# Patient Record
Sex: Male | Born: 1965 | Race: Black or African American | Hispanic: No | Marital: Single | Smoking: Never smoker
Health system: Southern US, Community
[De-identification: ages and names within clinical notes are randomized; demographics above are authoritative.]

## PROBLEM LIST (undated history)

## (undated) DIAGNOSIS — I2109 ST elevation (STEMI) myocardial infarction involving other coronary artery of anterior wall: Secondary | ICD-10-CM

## (undated) DIAGNOSIS — I2699 Other pulmonary embolism without acute cor pulmonale: Secondary | ICD-10-CM

## (undated) DIAGNOSIS — Z8249 Family history of ischemic heart disease and other diseases of the circulatory system: Secondary | ICD-10-CM

## (undated) DIAGNOSIS — I236 Thrombosis of atrium, auricular appendage, and ventricle as current complications following acute myocardial infarction: Secondary | ICD-10-CM

## (undated) DIAGNOSIS — I5042 Chronic combined systolic (congestive) and diastolic (congestive) heart failure: Secondary | ICD-10-CM

## (undated) DIAGNOSIS — I493 Ventricular premature depolarization: Secondary | ICD-10-CM

## (undated) DIAGNOSIS — E785 Hyperlipidemia, unspecified: Secondary | ICD-10-CM

## (undated) DIAGNOSIS — I491 Atrial premature depolarization: Secondary | ICD-10-CM

## (undated) DIAGNOSIS — I255 Ischemic cardiomyopathy: Secondary | ICD-10-CM

## (undated) DIAGNOSIS — I472 Ventricular tachycardia: Secondary | ICD-10-CM

## (undated) DIAGNOSIS — R945 Abnormal results of liver function studies: Secondary | ICD-10-CM

## (undated) DIAGNOSIS — I4729 Other ventricular tachycardia: Secondary | ICD-10-CM

## (undated) DIAGNOSIS — I2510 Atherosclerotic heart disease of native coronary artery without angina pectoris: Secondary | ICD-10-CM

## (undated) DIAGNOSIS — I471 Supraventricular tachycardia, unspecified: Secondary | ICD-10-CM

## (undated) DIAGNOSIS — R7989 Other specified abnormal findings of blood chemistry: Secondary | ICD-10-CM

## (undated) DIAGNOSIS — I10 Essential (primary) hypertension: Secondary | ICD-10-CM

## (undated) HISTORY — DX: Ventricular premature depolarization: I49.3

## (undated) HISTORY — PX: LEG SURGERY: SHX1003

## (undated) HISTORY — DX: Other ventricular tachycardia: I47.29

## (undated) HISTORY — DX: Ventricular tachycardia: I47.2

## (undated) HISTORY — DX: Supraventricular tachycardia, unspecified: I47.10

## (undated) HISTORY — PX: WRIST SURGERY: SHX841

## (undated) HISTORY — DX: Atrial premature depolarization: I49.1

## (undated) HISTORY — DX: Chronic combined systolic (congestive) and diastolic (congestive) heart failure: I50.42

## (undated) HISTORY — DX: Supraventricular tachycardia: I47.1

---

## 2002-12-07 ENCOUNTER — Emergency Department (HOSPITAL_COMMUNITY): Admission: EM | Admit: 2002-12-07 | Discharge: 2002-12-07 | Payer: Self-pay | Admitting: Emergency Medicine

## 2002-12-07 ENCOUNTER — Encounter: Payer: Self-pay | Admitting: *Deleted

## 2003-01-04 ENCOUNTER — Ambulatory Visit (HOSPITAL_BASED_OUTPATIENT_CLINIC_OR_DEPARTMENT_OTHER): Admission: RE | Admit: 2003-01-04 | Discharge: 2003-01-04 | Payer: Self-pay | Admitting: Surgery

## 2003-01-04 ENCOUNTER — Encounter (INDEPENDENT_AMBULATORY_CARE_PROVIDER_SITE_OTHER): Payer: Self-pay | Admitting: *Deleted

## 2010-05-25 ENCOUNTER — Emergency Department (HOSPITAL_COMMUNITY): Admission: EM | Admit: 2010-05-25 | Discharge: 2010-05-25 | Payer: Self-pay | Admitting: Emergency Medicine

## 2011-04-10 NOTE — Op Note (Signed)
NAME:  COURTENAY, CREGER                          ACCOUNT NO.:  [id number]   MEDICAL RECORD NO.:  [id number]                   PATIENT TYPE:  AMB   LOCATION:  DSC                                  FACILITY:  [facility]   PHYSICIAN:  Abigail Miyamoto, M.D.              DATE OF BIRTH:  June 15, 1966   DATE OF PROCEDURE:  01/04/2003  DATE OF DISCHARGE:                                 OPERATIVE REPORT   PREOPERATIVE DIAGNOSES:  Multiple subcutaneous nodules right forearm and  left lateral thigh.   POSTOPERATIVE DIAGNOSES:  Multiple subcutaneous nodules right forearm and  left lateral thigh.   PROCEDURE:  Excision of multiple skin nodules right forearm and left lateral  thigh.   SURGEON:  Abigail Miyamoto, M.D.   ANESTHESIA:  1% Lidocaine and monitored anesthesia care.   ESTIMATED BLOOD LOSS:  Minimal.   DESCRIPTION OF PROCEDURE:  The patient was brought to the operating room,  identified as Rodney Marquez. He was placed supine on the operating room table  and anesthesia was induced. The patient was then turned slightly on his  right side. His left thigh was then prepped and draped in the usual sterile  fashion. The skin overlying the four masses on the lateral aspect of the  thigh was anesthetized with 1% lidocaine. Again the patient was found to  have four subcutaneous masses that were all lateral. Incisions were made  over each mass with a #15 blade and the incision was carried down into the  subcutaneous tissue and each mass was excised in its entirety. 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 The most proximal of the thigh was  approximately 4 cm, the most distal was 2 cm with the two in the middle  being approximately 1 cm. All wounds were then closed with a single 2-0  Vicryl suture and the was closed with Steri-Strips. Gauze and tape were then  applied.   The patient was placed back in the supine position. His right forearm was  then prepped and draped in the usual sterile  fashion. The patient had two  masses on the right forearm, the skin over the top of each was anesthetized  with 1% lidocaine and again a small incision was then created with a 15  blade scalpel in both masses the first being 1 cm in size and the second  being 0.5 cm in size. They were both excised and sent to pathology for  identification. These again also appeared consistent with lipomas. The skin  of both incisions were then closed with 3-0 Vicryl sutures and Steri-Strips.  The patient tolerated the procedure well. All sponge, needle and instrument  counts were correct at the end of the procedure. The patient was then taken  in stable condition from the operating room to the recovery room.  Abigail Miyamoto, M.D.    DB/MEDQ  D:  01/04/2003  T:  01/04/2003  Job:  [id number]

## 2011-09-24 DIAGNOSIS — I2109 ST elevation (STEMI) myocardial infarction involving other coronary artery of anterior wall: Secondary | ICD-10-CM

## 2011-09-24 DIAGNOSIS — I255 Ischemic cardiomyopathy: Secondary | ICD-10-CM

## 2011-09-24 DIAGNOSIS — I236 Thrombosis of atrium, auricular appendage, and ventricle as current complications following acute myocardial infarction: Secondary | ICD-10-CM

## 2011-09-24 HISTORY — DX: ST elevation (STEMI) myocardial infarction involving other coronary artery of anterior wall: I21.09

## 2011-09-24 HISTORY — DX: Ischemic cardiomyopathy: I25.5

## 2011-09-24 HISTORY — DX: Thrombosis of atrium, auricular appendage, and ventricle as current complications following acute myocardial infarction: I23.6

## 2011-10-12 ENCOUNTER — Inpatient Hospital Stay (HOSPITAL_COMMUNITY)
Admission: EM | Admit: 2011-10-12 | Discharge: 2011-10-21 | DRG: 853 | Disposition: A | Discharge status: Home / Self Care | Payer: BC Managed Care – PPO | Attending: Cardiology | Admitting: Cardiology

## 2011-10-12 ENCOUNTER — Other Ambulatory Visit: Payer: Self-pay

## 2011-10-12 ENCOUNTER — Encounter (HOSPITAL_COMMUNITY)
Admission: EM | Disposition: A | Discharge status: Home / Self Care | Payer: Self-pay | Source: Home / Self Care | Attending: Cardiology

## 2011-10-12 ENCOUNTER — Inpatient Hospital Stay (HOSPITAL_COMMUNITY): Payer: BC Managed Care – PPO

## 2011-10-12 DIAGNOSIS — R509 Fever, unspecified: Secondary | ICD-10-CM | POA: Diagnosis present

## 2011-10-12 DIAGNOSIS — E785 Hyperlipidemia, unspecified: Secondary | ICD-10-CM | POA: Diagnosis present

## 2011-10-12 DIAGNOSIS — K5900 Constipation, unspecified: Secondary | ICD-10-CM | POA: Diagnosis present

## 2011-10-12 DIAGNOSIS — I2510 Atherosclerotic heart disease of native coronary artery without angina pectoris: Secondary | ICD-10-CM

## 2011-10-12 DIAGNOSIS — Z7902 Long term (current) use of antithrombotics/antiplatelets: Secondary | ICD-10-CM

## 2011-10-12 DIAGNOSIS — T4275XA Adverse effect of unspecified antiepileptic and sedative-hypnotic drugs, initial encounter: Secondary | ICD-10-CM | POA: Diagnosis present

## 2011-10-12 DIAGNOSIS — R7989 Other specified abnormal findings of blood chemistry: Secondary | ICD-10-CM | POA: Diagnosis present

## 2011-10-12 DIAGNOSIS — I236 Thrombosis of atrium, auricular appendage, and ventricle as current complications following acute myocardial infarction: Secondary | ICD-10-CM | POA: Diagnosis not present

## 2011-10-12 DIAGNOSIS — I2109 ST elevation (STEMI) myocardial infarction involving other coronary artery of anterior wall: Secondary | ICD-10-CM

## 2011-10-12 DIAGNOSIS — I213 ST elevation (STEMI) myocardial infarction of unspecified site: Secondary | ICD-10-CM

## 2011-10-12 DIAGNOSIS — I10 Essential (primary) hypertension: Secondary | ICD-10-CM | POA: Diagnosis present

## 2011-10-12 DIAGNOSIS — Z7901 Long term (current) use of anticoagulants: Secondary | ICD-10-CM

## 2011-10-12 DIAGNOSIS — I238 Other current complications following acute myocardial infarction: Secondary | ICD-10-CM | POA: Diagnosis not present

## 2011-10-12 DIAGNOSIS — E876 Hypokalemia: Secondary | ICD-10-CM | POA: Diagnosis not present

## 2011-10-12 DIAGNOSIS — R072 Precordial pain: Secondary | ICD-10-CM

## 2011-10-12 DIAGNOSIS — I2589 Other forms of chronic ischemic heart disease: Secondary | ICD-10-CM | POA: Diagnosis present

## 2011-10-12 HISTORY — PX: CORONARY ANGIOGRAM: SHX5786

## 2011-10-12 HISTORY — PX: PERCUTANEOUS STENT INTERVENTION: SHX5500

## 2011-10-12 HISTORY — DX: Family history of ischemic heart disease and other diseases of the circulatory system: Z82.49

## 2011-10-12 HISTORY — DX: Hyperlipidemia, unspecified: E78.5

## 2011-10-12 HISTORY — DX: Essential (primary) hypertension: I10

## 2011-10-12 LAB — HEPATIC FUNCTION PANEL
Albumin: 4.4 g/dL (ref 3.5–5.2)
Total Bilirubin: 0.6 mg/dL (ref 0.3–1.2)
Total Protein: 8 g/dL (ref 6.0–8.3)

## 2011-10-12 LAB — COMPREHENSIVE METABOLIC PANEL
ALT: 59 U/L — ABNORMAL HIGH (ref 0–53)
CO2: 30 mEq/L (ref 19–32)
Calcium: 10 mg/dL (ref 8.4–10.5)
Creatinine, Ser: 0.99 mg/dL (ref 0.50–1.35)
GFR calc Af Amer: >90 mL/min (ref >90)
GFR calc non Af Amer: >90 mL/min (ref >90)
Glucose, Bld: 127 mg/dL — ABNORMAL HIGH (ref 70–99)
Sodium: 138 mEq/L (ref 135–145)
Total Protein: 7.8 g/dL (ref 6.0–8.3)

## 2011-10-12 LAB — CARDIAC PANEL(CRET KIN+CKTOT+MB+TROPI)
CK, MB: 343 ng/mL (ref 0.3–4.0)
CK, MB: 108 ng/mL (ref 0.3–4.0)
Total CK: 3939 U/L — ABNORMAL HIGH (ref 7–232)
Total CK: 3041 U/L — ABNORMAL HIGH (ref 7–232)
Troponin I: >25 ng/mL (ref <0.30)
Troponin I: >25 ng/mL (ref <0.30)

## 2011-10-12 LAB — PROTIME-INR: Prothrombin Time: 13.7 seconds (ref 11.6–15.2)

## 2011-10-12 LAB — TSH: TSH: 0.516 u[IU]/mL (ref 0.350–4.500)

## 2011-10-12 LAB — POCT I-STAT, CHEM 8
BUN: 8 mg/dL (ref 6–23)
Chloride: 99 mEq/L (ref 96–112)
Creatinine, Ser: 1.1 mg/dL (ref 0.50–1.35)
Glucose, Bld: 121 mg/dL — ABNORMAL HIGH (ref 70–99)
Hemoglobin: 16.7 g/dL (ref 13.0–17.0)
Potassium: 2.9 mEq/L — ABNORMAL LOW (ref 3.5–5.1)
Sodium: 138 mEq/L (ref 135–145)

## 2011-10-12 LAB — CBC
HCT: 44.9 % (ref 39.0–52.0)
Hemoglobin: 16.2 g/dL (ref 13.0–17.0)
MCHC: 36.1 g/dL — ABNORMAL HIGH (ref 30.0–36.0)
RBC: 4.97 MIL/uL (ref 4.22–5.81)
WBC: 10 10*3/uL (ref 4.0–10.5)

## 2011-10-12 LAB — HEMOGLOBIN A1C: Hgb A1c MFr Bld: 6.2 % — ABNORMAL HIGH (ref <5.7)

## 2011-10-12 LAB — POCT I-STAT TROPONIN I

## 2011-10-12 SURGERY — CORONARY ANGIOGRAM

## 2011-10-12 MED ORDER — BIVALIRUDIN 250 MG IV SOLR
INTRAVENOUS | Status: AC
  Filled 2011-10-12: qty 250

## 2011-10-12 MED ORDER — NITROGLYCERIN IN D5W 200-5 MCG/ML-% IV SOLN
INTRAVENOUS | Status: AC
  Administered 2011-10-12: 50000 ug via INTRAVENOUS
  Filled 2011-10-12: qty 250

## 2011-10-12 MED ORDER — LIDOCAINE HCL (PF) 1 % IJ SOLN
INTRAMUSCULAR | Status: AC
  Filled 2011-10-12: qty 30

## 2011-10-12 MED ORDER — ZOLPIDEM TARTRATE 5 MG PO TABS: 5.0000 mg | ORAL_TABLET | Freq: Every evening | ORAL | Status: DC | PRN

## 2011-10-12 MED ORDER — SODIUM CHLORIDE 0.9 % IV SOLN
Freq: Once | INTRAVENOUS | Status: AC
  Administered 2011-10-12: 07:00:00 via INTRAVENOUS

## 2011-10-12 MED ORDER — MIDAZOLAM HCL 2 MG/2ML IJ SOLN
INTRAMUSCULAR | Status: AC
  Filled 2011-10-12: qty 2

## 2011-10-12 MED ORDER — METOPROLOL TARTRATE 1 MG/ML IV SOLN
2.5000 mg | Freq: Once | INTRAVENOUS | Status: AC
  Administered 2011-10-12: 2.5 mg via INTRAVENOUS

## 2011-10-12 MED ORDER — SODIUM CHLORIDE 0.9 % IV SOLN
1.0000 mL/kg/h | INTRAVENOUS | Status: AC
  Administered 2011-10-12: 1 mL/kg/h via INTRAVENOUS

## 2011-10-12 MED ORDER — POTASSIUM CHLORIDE CRYS ER 20 MEQ PO TBCR
40.0000 meq | EXTENDED_RELEASE_TABLET | Freq: Once | ORAL | Status: AC
  Administered 2011-10-12: 40 meq via ORAL

## 2011-10-12 MED ORDER — VERAPAMIL HCL 2.5 MG/ML IV SOLN
INTRAVENOUS | Status: AC
  Filled 2011-10-12: qty 2

## 2011-10-12 MED ORDER — ONDANSETRON HCL 4 MG/2ML IJ SOLN: 4.0000 mg | Freq: Four times a day (QID) | INTRAMUSCULAR | Status: DC | PRN

## 2011-10-12 MED ORDER — METOPROLOL TARTRATE 1 MG/ML IV SOLN
INTRAVENOUS | Status: AC
  Filled 2011-10-12: qty 5

## 2011-10-12 MED ORDER — POTASSIUM CHLORIDE 20 MEQ/15ML (10%) PO LIQD
ORAL | Status: AC
  Filled 2011-10-12: qty 30

## 2011-10-12 MED ORDER — HEPARIN BOLUS VIA INFUSION: 5000.0000 [IU] | Freq: Once | INTRAVENOUS | Status: DC

## 2011-10-12 MED ORDER — HEPARIN (PORCINE) IN NACL 2-0.9 UNIT/ML-% IJ SOLN
INTRAMUSCULAR | Status: AC
  Filled 2011-10-12: qty 2000

## 2011-10-12 MED ORDER — ROSUVASTATIN CALCIUM 40 MG PO TABS
40.0000 mg | ORAL_TABLET | Freq: Every day | ORAL | Status: DC
  Administered 2011-10-12 – 2011-10-16 (×5): 40 mg via ORAL
  Filled 2011-10-12 (×6): qty 1

## 2011-10-12 MED ORDER — HEPARIN SODIUM (PORCINE) 5000 UNIT/ML IJ SOLN
INTRAMUSCULAR | Status: AC
  Administered 2011-10-12: 5000 [IU]
  Filled 2011-10-12: qty 1

## 2011-10-12 MED ORDER — SODIUM CHLORIDE 0.9 % IV SOLN
INTRAVENOUS | Status: DC
  Administered 2011-10-12: 10 mL/h via INTRAVENOUS
  Administered 2011-10-19: 17:00:00 via INTRAVENOUS

## 2011-10-12 MED ORDER — HEPARIN BOLUS VIA INFUSION: 4000.0000 [IU] | Freq: Once | INTRAVENOUS | Status: DC

## 2011-10-12 MED ORDER — NITROGLYCERIN 0.2 MG/ML ON CALL CATH LAB
INTRAVENOUS | Status: AC
  Filled 2011-10-12: qty 1

## 2011-10-12 MED ORDER — POTASSIUM CHLORIDE CRYS ER 20 MEQ PO TBCR
EXTENDED_RELEASE_TABLET | ORAL | Status: AC
  Administered 2011-10-12: 40 meq via ORAL
  Filled 2011-10-12: qty 2

## 2011-10-12 MED ORDER — PRASUGREL HCL 10 MG PO TABS
ORAL_TABLET | ORAL | Status: AC
  Administered 2011-10-12: 10 mg via ORAL
  Filled 2011-10-12: qty 6

## 2011-10-12 MED ORDER — METOPROLOL TARTRATE 1 MG/ML IV SOLN
INTRAVENOUS | Status: AC
  Administered 2011-10-12: 2.5 mg via INTRAVENOUS
  Filled 2011-10-12: qty 5

## 2011-10-12 MED ORDER — NITROGLYCERIN IN D5W 200-5 MCG/ML-% IV SOLN: 3.0000 ug/min | INTRAVENOUS | Status: DC

## 2011-10-12 MED ORDER — NITROGLYCERIN IN D5W 200-5 MCG/ML-% IV SOLN
10.0000 ug/min | INTRAVENOUS | Status: AC
  Administered 2011-10-12: 10 ug/min via INTRAVENOUS
  Administered 2011-10-12: 50000 ug via INTRAVENOUS

## 2011-10-12 MED ORDER — ASPIRIN EC 81 MG PO TBEC
81.0000 mg | DELAYED_RELEASE_TABLET | Freq: Every day | ORAL | Status: DC
  Administered 2011-10-13 – 2011-10-21 (×9): 81 mg via ORAL
  Filled 2011-10-12 (×10): qty 1

## 2011-10-12 MED ORDER — ATROPINE SULFATE 1 MG/ML IJ SOLN
INTRAMUSCULAR | Status: AC
  Filled 2011-10-12: qty 1

## 2011-10-12 MED ORDER — PRASUGREL HCL 10 MG PO TABS
10.0000 mg | ORAL_TABLET | Freq: Every day | ORAL | Status: DC
  Administered 2011-10-12 – 2011-10-16 (×5): 10 mg via ORAL
  Filled 2011-10-12 (×5): qty 1

## 2011-10-12 MED ORDER — MORPHINE SULFATE 2 MG/ML IJ SOLN
1.0000 mg | INTRAMUSCULAR | Status: DC | PRN
  Administered 2011-10-12 – 2011-10-14 (×10): 1 mg via INTRAVENOUS
  Filled 2011-10-12 (×10): qty 1

## 2011-10-12 MED ORDER — METOPROLOL TARTRATE 25 MG PO TABS
25.0000 mg | ORAL_TABLET | Freq: Two times a day (BID) | ORAL | Status: DC
  Administered 2011-10-12 (×2): 25 mg via ORAL
  Filled 2011-10-12 (×4): qty 1

## 2011-10-12 MED ORDER — FENTANYL CITRATE 0.05 MG/ML IJ SOLN
INTRAMUSCULAR | Status: AC
  Filled 2011-10-12: qty 2

## 2011-10-12 MED ORDER — NITROGLYCERIN 0.4 MG SL SUBL: 0.4000 mg | SUBLINGUAL_TABLET | SUBLINGUAL | Status: DC | PRN

## 2011-10-12 MED ORDER — ACETAMINOPHEN 325 MG PO TABS
650.0000 mg | ORAL_TABLET | ORAL | Status: DC | PRN
  Administered 2011-10-13 – 2011-10-17 (×9): 650 mg via ORAL
  Filled 2011-10-12 (×9): qty 2

## 2011-10-12 MED ORDER — ALPRAZOLAM 0.25 MG PO TABS: 0.2500 mg | ORAL_TABLET | Freq: Two times a day (BID) | ORAL | Status: DC | PRN

## 2011-10-12 NOTE — ED Notes (Signed)
Code STEMI initiated. Stuckey MD w/ cardiology to see patient

## 2011-10-12 NOTE — ED Notes (Signed)
Notified Pickering, MD of abnormal lab test results; tubed results to cath lab for pt is there now

## 2011-10-12 NOTE — ED Notes (Signed)
Stuckey MD at bedside.

## 2011-10-12 NOTE — ED Notes (Signed)
Pt states that he took (6) 325mg  asa's this am.

## 2011-10-12 NOTE — Progress Notes (Signed)
  Echocardiogram 2D Echocardiogram has been performed.  Dewitt Hoes, RDCS 10/12/2011, 11:15 AM

## 2011-10-12 NOTE — Progress Notes (Signed)
2900 Sheath Removal Note: Removed by 2900 RN  Site Area: Scant bloody ooze noted. Sheath sutured in.  Site prior to Removal:   Pressure applied for: 25 minutes                  Minutes beginning:   1130   Patient status during Sheath pull: Calm, appropriate, V/S WNL    Post Sheath Removal Groin Site: level 0. WNL  Post Cath Instructions Given: Yes  Post Pulses Present: Yes  Pressure Dressing applied: Yes  Comments: V/S stable and consistent.

## 2011-10-12 NOTE — H&P (Signed)
History and Physical  Patient ID: Rodney Marquez MRN: [id number], DOB/AGE: 05/10/1966 45 y.o. Date of Encounter: 10/12/2011  Primary Cardiologist: New to [facility]  Chief Complaint: CP Reason for Admission: STEMI  HPI: 45 y/o M with no prior hx of CAD but a history of HTN and HL presented to [facility] with complaints of chest pain since yesterday. He has had on/off chest pain which he describes as pressure across his chest, waxing and waning without associated nausea, vomiting, diaphoresis or SOB. He has however felt more fatigued than usual. The pain progressively got worse today so he took 6 full-dose aspirin at home but didn't feel better so came to the ER. There, EKG was identified as an acute STEMI showing anterior and inferior ST elevation. He was taken emergently to the cath lab and is undergoing cardiac catheterization to define his anatomy.  Past Medical History  Diagnosis Date  . HTN (hypertension)   . HLD (hyperlipidemia)   Pt also endorses a remote hx of "rapid heart rate" but has not followed with a cardiologist recently. No prior hx of TIA/CVA or DM.  Home Meds: He endorses taking a blood pressure medicine but isn't sure what it is - listed in EPIC is the medicine below, will verify with pharmacy. Prior to Admission medications   Medication Sig Start Date End Date Taking? Authorizing Provider  valsartan-hydrochlorothiazide (DIOVAN-HCT) 160-12.5 MG per tablet Take 1 tablet by mouth daily.     Yes Historical Provider, MD    Allergies: No Known Allergies  History   Social History  . Marital Status: Married   Social History Main Topics  . Smoking status: Never Smoker   . Smokeless tobacco: Not on file  . Alcohol Use: Yes (1 beer/week)  . Drug Use: No   Family History  Problem Relation Age of Onset  . Coronary artery disease Brother     Had MI in his 20's   Review of Systems: General: negative for chills, fever, night sweats or weight changes. Positive for  fatigue. Cardiovascular: Positive for chest pain. Negative for edema, orthopnea, palpitations, paroxysmal nocturnal dyspnea or shortness of breath Dermatological: negative for rash Respiratory: negative for cough or wheezing Urologic: negative for hematuria Abdominal: negative for nausea, vomiting, diarrhea, bright red blood per rectum, melena, or hematemesis Neurologic: negative for visual changes, syncope, or dizziness All other systems reviewed and are otherwise negative except as noted above.  Labs: Pending at this time. Radiology/Studies: Pending at this time.   EKG: Sinus tach 129bpm with ST elevation III, avF, V2-V6 with Q's noted III, avF, and V3-V4.   Physical Exam: Blood pressure 143/95, pulse 113, temperature 98.7 F (37.1 C), temperature source Oral, resp. rate 21, height 5\' 7"  (1.702 m), weight 175 lb (79.379 kg), SpO2 100.00%. General: Well developed, well nourished, shivering. No acute distress. Head: Normocephalic, atraumatic, sclera non-icteric, no xanthomas, nares are without discharge.  Neck: Negative for carotid bruits. JVD not elevated. Lungs: Clear bilaterally to auscultation without wheezes, rales, or rhonchi. Breathing is unlabored. Heart: RRR with S1 S2. No murmurs, rubs, or gallops appreciated. Abdomen: Soft, non-tender, non-distended with normoactive bowel sounds. No hepatomegaly. No rebound/guarding. No obvious abdominal masses. Msk:  Strength and tone appear normal for age. Extremities: No clubbing or cyanosis. No edema.  Distal pedal pulses are 2+ and equal bilaterally. Neuro: Alert and oriented X 3. Moves all extremities spontaneously. Psych:  Responds to questions appropriately with a normal affect.   ASSESSMENT AND PLAN:  1. Acute anterior/inferior STEMI -  ongoing pain for the past 24 hours so may be late presenting. Emergent cath by Dr. Riley Kill. Will admit to CCU post-cath & initiate daily ASA/statin/BB. Will defer choice of antiplatelet therapy to Dr.  Riley Kill once anatomy known. In regards to admit orders, IV/Diet/Activity will be determined per post-cath orders.  2. HTN - will hold off on home med for now and replace with BB. Consider resuming home med once labs are back.  3. HL - check FLP/LFTs and initiate statin therapy.  Signed, Rodney Spies PA-C 10/12/2011, 7:18 AM  This gentleman presented to the emergency room with chest pain of approximately 24 hours. He had the onset of chest pain yesterday morning at about 9 AM. He arrived early this morning in the [facility] emergency room and EKG was consistent with myocardial infarction of likely many hours in duration with Q waves out across the anterior precordium. He had chest pain but was not short of breath. He was brought up to the catheterization laboratory for urgent catheterization and possible percutaneous coronary intervention. I explained to him the process, and the potential risks. He was agreeable to proceed with diagnostic catheterization. He has no prior history of stroke or bleeding, and he denied use of recreational drugs. As noted, his examination was unremarkable and there were no rales. Laboratory studies revealed a normal creatinine but low potassium and his potassium was replaced.  Rodney Marquez 10/12/2011 9:26 AM

## 2011-10-12 NOTE — ED Notes (Signed)
Phone call to mother Mrs Dragoo at [phone] per pt request  And notified her of pt having chest pain

## 2011-10-12 NOTE — Progress Notes (Signed)
Patient is stable, but with HR slightly high, and some continued chest pain.  BP is stable at the present time.  Groin looks ok, without evidence of bleeding. No obvious rub on exam, but ECG with continued changes.  Poor distal runoff at cath, probably secondary to apical abnormality with extensive edema.  Echo reviewed with Dr. Tenny Craw, and official report by Dr. Myrtis Ser.  EF is approximately 40% with apical abnormality.   No obvious evidence of LV mural thrombus.  The patient's family is aware that risk of rupture is somewhat increased.   Will give additional K as it was quite low, and recheck value in am.    Shawnie Pons 10/12/2011 4:54 PM

## 2011-10-12 NOTE — ED Provider Notes (Signed)
History     CSN: [id number] Arrival date & time: 10/12/2011  6:46 AM   First MD Initiated Contact with Patient 10/12/11 [phone]      Chief Complaint  Patient presents with  . Chest Pain    (Consider location/radiation/quality/duration/timing/severity/associated sxs/prior treatment) HPI Comments: 45 year old male with history of hypertension and high cholesterol who presents with substernal chest heaviness and pressure which started more than 12 hours ago, has been constant, gradually worsening and not associated with diaphoresis nausea or shortness of breath. He states that he has a distant history of having a stress test which was normal approximately 9 years ago. He does not remember who his cardiologist is  Patient is a 45 y.o. male presenting with chest pain. The history is provided by the patient.  Chest Pain The chest pain began 12 - 24 hours ago. Duration of episode(s) is 15 hours. Chest pain occurs constantly. The chest pain is worsening. Associated with: nothing. At its most intense, the pain is at 10/10. The pain is currently at 10/10. The severity of the pain is severe. The quality of the pain is described as heavy. The pain does not radiate. Exacerbated by: nothing. Primary symptoms comment: chest pain Treatments tried: 6 X 325 ASA prior to arrival. Risk factors include alcohol intake and male gender (hypertension, hypercholesterol).  His past medical history is significant for hyperlipidemia and hypertension.     History reviewed. No pertinent past medical history.  History reviewed. No pertinent past surgical history.  No family history on file.  History  Substance Use Topics  . Smoking status: Never Smoker   . Smokeless tobacco: Not on file  . Alcohol Use: Yes      Review of Systems  Cardiovascular: Positive for chest pain.  All other systems reviewed and are negative.    Allergies  Review of patient's allergies indicates no known allergies.  Home  Medications   Current Outpatient Rx  Name Route Sig Dispense Refill  . VALSARTAN-HYDROCHLOROTHIAZIDE 160-12.5 MG PO TABS Oral Take 1 tablet by mouth daily.        BP 143/95  Pulse 113  Temp(Src) 98.7 F (37.1 C) (Oral)  Resp 21  Ht 5\' 7"  (1.702 m)  Wt 175 lb (79.379 kg)  BMI 27.41 kg/m2  SpO2 100%  Physical Exam  Nursing note and vitals reviewed. Constitutional: He appears well-developed and well-nourished. No distress.  HENT:  Head: Normocephalic and atraumatic.  Mouth/Throat: Oropharynx is clear and moist. No oropharyngeal exudate.  Eyes: Conjunctivae and EOM are normal. Pupils are equal, round, and reactive to light. Right eye exhibits no discharge. Left eye exhibits no discharge. No scleral icterus.  Neck: Normal range of motion. Neck supple. No JVD present. No thyromegaly present.  Cardiovascular: Regular rhythm, normal heart sounds and intact distal pulses.  Exam reveals no gallop and no friction rub.   No murmur heard.      Tachycardia to 110  Pulmonary/Chest: Effort normal and breath sounds normal. No respiratory distress. He has no wheezes. He has no rales.  Abdominal: Soft. Bowel sounds are normal. He exhibits no distension and no mass. There is no tenderness.  Musculoskeletal: Normal range of motion. He exhibits no edema and no tenderness.  Lymphadenopathy:    He has no cervical adenopathy.  Neurological: He is alert. Coordination normal.  Skin: Skin is warm and dry. No rash noted. No erythema.  Psychiatric: He has a normal mood and affect. His behavior is normal.    ED Course  Procedures (including critical care time)   Labs Reviewed  I-STAT TROPONIN I  I-STAT, CHEM 8  APTT  PROTIME-INR   No results found.   1. STEMI (ST elevation myocardial infarction)       MDM  EKG on arrival shows ST elevation MI, patient  has had more than his share of aspirin prior to arrival, is currently having pain and has a normal blood pressure of 140/90. Code STEMI  activated immediately at 648, Dr. Riley Kill responded immediately and is taking the patient to the catheterization lab at this time. Prior to leaving the emergency department the patient received heparin bolus and 2.5 mg of Lopressor at Dr. Riley Kill request.  CRITICAL CARE Performed by: Vida Roller   Total critical care time: 30  Critical care time was exclusive of separately billable procedures and treating other patients.  Critical care was necessary to treat or prevent imminent or life-threatening deterioration.  Critical care was time spent personally by me on the following activities: development of treatment plan with patient and/or surrogate as well as nursing, discussions with consultants, evaluation of patient's response to treatment, examination of patient, obtaining history from patient or surrogate, ordering and performing treatments and interventions, ordering and review of laboratory studies, ordering and review of radiographic studies, pulse oximetry and re-evaluation of patient's condition.   ED ECG REPORT   Date: 10/12/2011 06:47  Rate: 129  Rhythm: sinus tachycardia  QRS Axis: normal  Intervals: normal  ST/T Wave abnormalities: ST elevations inferiorly, ST elevations anteriorly and ST elevations laterally  Conduction Disutrbances:none  Narrative Interpretation:   Old EKG Reviewed: changes noted and EKG dated 12/07/2002 with no signs of ischemia   Vida Roller, MD 10/12/11 [phone]

## 2011-10-12 NOTE — Op Note (Signed)
Cardiac Catheterization Procedure Note  Name: Rodney Marquez MRN: [id number] DOB: Mar 17, 1966  Procedure: Left Heart Cath, Selective Coronary Angiography,  PTCA/Stent of LAD  Indication:    Diagnostic Procedure Details: The right groin was prepped, draped, and anesthetized with 1% lidocaine. Using the modified Seldinger technique, a 6 French sheath was introduced into the right femoral artery. The right coronary artery could not be selectively engaged due to an anterior takeoff. We attempted at the end of the case to engage the vessel with a left bypass catheter but I elected not to use an Amplatz. The vessel was well visualized. The left coronary artery was injected using a Judkins left 3.5 guiding catheter. The patient was given weight adjusted bivalirudin and also received 60 mg of prasugrel.  Oral  potassium was also given.  We were able to dilate the distal vessel, but there was very poor flow. The area of dissection was evident and was stented with a mini vision stent. Post dilatation was done with a noncompliant balloon, 2.25 mm at nominal pressures. We passed a 1.5 mm balloon down to the apex and with this we were able to restore flow. Multiple doses of intracoronary verapamil were administered to attempt to improve microvascular flow. ACT was checked at the beginning of the procedure and was adequate. Catheter exchanges were performed over a wire. The procedure was tolerated well, we secured the femoral sheath into place, and I spoke to his family in detail. There were no major complications.  PROCEDURAL FINDINGS Hemodynamics: AO 135/99 LV 135/16  Coronary angiography: Coronary dominance: right  Left mainstem: The left main coronary artery was without critical narrowing. There was mild ostial narrowing of 20%.  Left anterior descending (LAD): The left anterior descending artery was totally occluded just beyond the origin of the second diagonal branch. The vessel proximal to the site,  including the first diagonal and septal, were free of significant disease. At the site of total occlusion, the vessel was dissected with TIMI 0 flow. Following wiring, and balloon dilatation, the vessel was opened demonstrating an infarction related dissection. Despite balloon dilatation, there was very poor distal runoff. A 2 mm x 28 mm non-drug-eluting mini vision stent was placed and post dilated. Following passage of the balloon distally, there was TIMI 2 flow into a small caliber apical vessel. This was most consistent with an edematous apex due to delayed presentation.  Left circumflex (LCx): The circumflex artery consisted of 2 major marginal branches, both of which were free of significant disease.  Right coronary artery (RCA): The right coronary artery was nonselectively injected. It has an anterior takeoff from the left coronary cusp.  It will require an AMPLATZ in the future to engage.  It was well visualized and provided a posterior descending and posterolateral branch, both of which were free of disease.  Left ventriculography: Not performed secondary to delayed presentation.  LVEDP measured with a RCA catheter.  No LV done in anticipation of LV mural thrombus given 24 hour presentation.  PCI Data: Vessel - LAD/Segment - mid Percent Stenosis (pre)  100 TIMI-flow 0 Stent Minivision 2.0 by 28mm Percent Stenosis (post) 0 TIMI-flow (post) 2  Final Conclusions:  Anterior MI, late presentation, with PCI with open artery, but Timi 2 flow secondary to muscle edema  Recommendations: See orders.    Shawnie Pons 10/12/2011, 9:46 AM

## 2011-10-13 ENCOUNTER — Inpatient Hospital Stay (HOSPITAL_COMMUNITY): Payer: BC Managed Care – PPO

## 2011-10-13 ENCOUNTER — Encounter (HOSPITAL_COMMUNITY): Payer: Self-pay

## 2011-10-13 LAB — CBC
HCT: 40.7 % (ref 39.0–52.0)
Hemoglobin: 14.5 g/dL (ref 13.0–17.0)
MCH: 32.7 pg (ref 26.0–34.0)
MCV: 91.9 fL (ref 78.0–100.0)
RBC: 4.43 MIL/uL (ref 4.22–5.81)
WBC: 11.8 10*3/uL — ABNORMAL HIGH (ref 4.0–10.5)

## 2011-10-13 LAB — POCT I-STAT, CHEM 8
BUN: 7 mg/dL (ref 6–23)
Calcium, Ion: 1.15 mmol/L (ref 1.12–1.32)
Creatinine, Ser: 0.9 mg/dL (ref 0.50–1.35)
Hemoglobin: 14.3 g/dL (ref 13.0–17.0)
Sodium: 129 mEq/L — ABNORMAL LOW (ref 135–145)
TCO2: 25 mmol/L (ref 0–100)

## 2011-10-13 LAB — URINALYSIS, ROUTINE W REFLEX MICROSCOPIC
Bilirubin Urine: NEGATIVE
Glucose, UA: NEGATIVE mg/dL
Ketones, ur: NEGATIVE mg/dL
Nitrite: NEGATIVE
Protein, ur: 30 mg/dL — AB
pH: 6 (ref 5.0–8.0)

## 2011-10-13 LAB — BASIC METABOLIC PANEL
CO2: 28 mEq/L (ref 19–32)
Chloride: 97 mEq/L (ref 96–112)
Glucose, Bld: 113 mg/dL — ABNORMAL HIGH (ref 70–99)
Potassium: 3.8 mEq/L (ref 3.5–5.1)
Sodium: 134 mEq/L — ABNORMAL LOW (ref 135–145)

## 2011-10-13 LAB — URINE MICROSCOPIC-ADD ON

## 2011-10-13 LAB — CK TOTAL AND CKMB (NOT AT ARMC)
Relative Index: 2 (ref 0.0–2.5)
Total CK: 1473 U/L — ABNORMAL HIGH (ref 7–232)

## 2011-10-13 LAB — LIPID PANEL
HDL: 70 mg/dL (ref >39)
LDL Cholesterol: 90 mg/dL (ref 0–99)
Triglycerides: 61 mg/dL (ref <150)

## 2011-10-13 LAB — POCT ACTIVATED CLOTTING TIME: Activated Clotting Time: 540 seconds

## 2011-10-13 MED ORDER — LISINOPRIL 5 MG PO TABS
5.0000 mg | ORAL_TABLET | Freq: Every day | ORAL | Status: DC
  Administered 2011-10-13 – 2011-10-21 (×9): 5 mg via ORAL
  Filled 2011-10-13 (×9): qty 1

## 2011-10-13 MED ORDER — METOPROLOL TARTRATE 50 MG PO TABS
50.0000 mg | ORAL_TABLET | Freq: Two times a day (BID) | ORAL | Status: DC
  Administered 2011-10-13 – 2011-10-21 (×17): 50 mg via ORAL
  Filled 2011-10-13 (×19): qty 1

## 2011-10-13 MED FILL — Dextrose Inj 5%: INTRAVENOUS | Qty: 100 | Status: AC

## 2011-10-13 NOTE — Progress Notes (Signed)
Noted events of cath/mi will hold ambulation until advised by cards.   Rosalie Doctor

## 2011-10-13 NOTE — Progress Notes (Addendum)
[facility]   Subjective:  There were events overnight - pt continued to have pain throughout the night consistent in severity with that on presentation. Currently, the patient confirms symptoms of chest pain, rated 5/10, left chest without radiation. He denies symptoms of dyspnea, irregular heart beat and palpitations, chills, fevers.    Objective:  Vital Signs:   BP 105/72  Pulse 87  Temp(Src) 98.5 F (36.9 C) (Oral)  Resp 16  Ht 5\' 7"  (1.702 m)  Wt 168 lb 6.9 oz (76.4 kg)  BMI 26.38 kg/m2  SpO2 100%   24-hour weight change: Weight change: -4 lb 2.3 oz (-1.88 kg)  Intake/Output:  11/19 0701 - 11/20 0700 In: 1338.5 [P.O.:480; I.V.:858.5] Out: 1540 [Urine:1540]   Physical Exam: General: Vital signs reviewed and noted. Well-developed, well-nourished, in no acute distress; alert, appropriate and cooperative throughout examination.  Lungs:  Normal respiratory effort. Clear to auscultation BL without crackles or wheezes.  Heart: RRR. S1 and S2 normal without gallop, murmur, or rubs.  Abdomen:  BS normoactive. Soft, Nondistended, non-tender.  No masses or organomegaly.  Extremities: No pretibial edema.     Labs: Basic Metabolic Panel:  Lab 10/13/11 1610 10/12/11 0752 10/12/11 0650  NA 134* 138 138  K 3.8 2.9* 2.9*  CL 97 99 96  CO2 28 -- 30  GLUCOSE 113* 121* 127*  BUN 12 8 9   CREATININE 1.05 1.10 0.99  CALCIUM 9.3 -- 10.0  MG -- -- --  PHOS -- -- --    Liver Function Tests:  Lab 10/12/11 0650  AST 255*255*  ALT 60*59*  ALKPHOS 6465  BILITOT 0.60.6  PROT 8.07.8  ALBUMIN 4.44.5    CBC:  Lab 10/13/11 0520 10/12/11 0752 10/12/11 0650  WBC 11.8* -- 10.0  NEUTROABS -- -- --  HGB 14.5 16.7 16.2  HCT 40.7 49.0 44.9  MCV 91.9 -- 90.3  PLT 187 -- 249    Cardiac Enzymes:  Lab 10/12/11 1745 10/12/11 1144 10/12/11 0650  CKTOTAL 3041* 3957* 3939*  CKMB 108.0* 279.8* 343.0*  CKMBINDEX -- -- --  TROPONINI >25.00* >25.00* >25.00*     Microbiology: Results for orders placed during the hospital encounter of 10/12/11  MRSA PCR SCREENING     Status: Normal   Collection Time   10/12/11 12:55 PM      Component Value Range Status Comment   MRSA by PCR NEGATIVE  NEGATIVE  Final     Coagulation Studies:  Basename 10/12/11 0650  LABPROT 13.7  INR 1.03     Other results: EKG (10/13/2011) - normal sinus rhythm, rate of approximately 100 bpm, left  axis, ST elevations in I, aVL, V6, V2-V6,   EKG (10/12/2011) - normal sinus rhythm, rate of approximately 100 bpm, left axis, ST elevation in II, III, aVF, Q's noted III, avF, and V3-V4.   Imaging:  Dg Chest Port 1 View (10/13/2011) - Suboptimal inspiration.  No acute cardiopulmonary disease.   Dg Chest Port 1 View (10/12/2011)  -  No acute abnormalities.    Cardiac catheterization (10/12/2011) -   Right coronary dominance  Left mainstem - mild ostial narrowing 20%  LAD - totally occluded just beyond origin of second diagonal branch. At the site of occlusion, the vessel was dissected with TIMI 0 flow. Following wiring, and balloon dilation, the vessel was opened demonstrating infarction related dissection. Poor distal runoff.   LAD - non-drug-eluting mini vision stent 2mm x 28 mm with residual TIMI 1 flow into small caliber apical vessel (  Per Dr. Elease Hashimoto) . Thought to be most consistent with edematous apex due to delayed presentation.  Left circumflex - 2 major marginal branches, no significant disease  RCA - Anterior takeoff from the left coronary cusp. It will require an AMPLATZ in the future to engage.   Left ventriculography - not performed.   2D-Echocardiogram (10/12/2011) - LV - severe hypokinesis of apical anterior, apical lateral, apical septal segments. Hypokinesis of apical inferior segment. EF 40% with vigorous motion of non-affected segments. Grade 1 diastolic dysfunction.    Medications:  Infusions:    . sodium chloride 1 mL/kg/hr (10/12/11  1046)  . sodium chloride 10 mL/hr at 10/13/11 0600  . nitroGLYCERIN Stopped (10/12/11 1800)  . DISCONTD: nitroGLYCERIN      Scheduled Medications:    . sodium chloride   Intravenous Once  . aspirin EC  81 mg Oral Daily  . bivalirudin      . bivalirudin      . fentaNYL      . heparin      . heparin      . lidocaine      . metoprolol      . metoprolol      . metoprolol  2.5 mg Intravenous Once  . metoprolol  2.5 mg Intravenous Once  . metoprolol tartrate  25 mg Oral BID  . midazolam      . nitroGLYCERIN      . potassium chloride  40 mEq Oral Once  . prasugrel  10 mg Oral Daily  . rosuvastatin  40 mg Oral Daily  . verapamil      . DISCONTD: heparin  4,000 Units Intravenous Once  . DISCONTD: heparin  5,000 Units Intravenous Once    PRN Medications: acetaminophen, ALPRAZolam, morphine, nitroGLYCERIN, ondansetron (ZOFRAN) IV, zolpidem    Assessment/ Plan:   Pt is a 45 y.o. yo male with a PMHx of HTN, HLD who was admitted on 10/12/2011 with symptoms of chest pain that began on day prior to admission, which was determined to be secondary to acute anterior and inferior STEMI. He had emergent heart cath on 11/19 showing total occlusion of the LAD just beyond origin of second diagonal branch, requiring placement of a non-DES mini-vision stent. Interventions at this time will be focused on optimizing risk factor management, monitoring for signs of rupture.   Anterior STEMI - pt had a delayed presentation with EKG changes on admission showing anterior STEMI. He as taken emergently to cath lab and is now s/p non-DES mini vision stent to his LAD, which was totally occluded. He was also found to have infarction related dissection.   AM EKG is concerning for lateral infarction, increased ST elevation, convexity of ST segment. Will therefore, contact AM attending for further review.  Continue BB, ASA, morphine, nitro, statin.  Continue Effient.   Start coumadin.   HLD - continue  statin, LDL may be artificially low during acute STEMI (not on therapy at home), therefore, will have to again evaluate as an outpatient with further adjustment of therapy as indicated.  Continue statin   HTN - BP stable. However, HR continues to be elevated.  Escalate BB to improve HR control.   Start low-dose Lisinopril.    This patient's case and plan of care was discussed with attending, Kristeen Miss, M.D.    Length of Stay: 1   Johnette Abraham, Norman Herrlich, Internal Medicine Resident 10/13/2011, 6:32 AM    Attending note The patient was seen and examined.  Agree with assessment and plan as noted above.  Pt presented late into an anterior MI.  Stenting of mid LAD did not significantly improve flow to the distal LAD.  Pt has had persistent pain all night but it is gradually improving.  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 He remains tachycardic. Agree with increased lopressor.   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   Vesta Mixer, Montez Hageman., MD, [facility] 10/13/2011, 7:56 AM   Addendum:  Discussed case with Dr. Riley Kill.  He does not want to start coumadin yet.  We will get an echo on Friday to re-evaluate is apical aneurism.   Vesta Mixer, Montez Hageman., MD, [facility] 10/13/2011, 8:22 AM

## 2011-10-13 NOTE — Progress Notes (Signed)
PHARMACY - CRITICAL CARE PROGRESS NOTE  Pharmacy Consult for critical care medicine Indication: Inpatient critical care   No Known Allergies  Patient Measurements: Height: 5\' 7"  (170.2 cm) Weight: 168 lb 6.9 oz (76.4 kg) IBW/kg (Calculated) : 66.1   Vital Signs: Temp: 99.8 F (37.7 C) (11/20 0800) Temp src: Oral (11/20 0800) BP: 105/72 mmHg (11/20 0600) Pulse Rate: 87  (11/20 0600) Intake/Output from previous day: 11/19 0701 - 11/20 0700 In: 1338.5 [P.O.:480; I.V.:858.5] Out: 1540 [Urine:1540] Intake/Output from this shift:   Vent settings for last 24 hours:    Labs:  [facility] 10/13/11 0520 10/12/11 0752 10/12/11 0650  WBC 11.8* -- 10.0  HGB 14.5 16.7 16.2  HCT 40.7 49.0 44.9  PLT 187 -- 249  APTT -- -- 24  INR -- -- 1.03  CREATININE 1.05 1.10 0.99  LABCREA -- -- --  CREATININE 1.05 1.10 0.99  LABCREA -- -- --  CREAT24HRUR -- -- --  MG -- -- --  PHOS -- -- --  ALBUMIN -- -- 4.44.5  PROT -- -- 8.07.8  AST -- -- 161*096*  ALT -- -- 60*59*  ALKPHOS -- -- 6465  BILITOT -- -- 0.60.6  BILIDIR -- -- <0.1  IBILI -- -- NOT CALCULATED   Estimated Creatinine Clearance: 84.8 ml/min (by C-G formula based on Cr of 1.05).  No results found for this basename: GLUCAP:3 in the last 72 hours  Microbiology: Recent Results (from the past 720 hour(s))  MRSA PCR SCREENING     Status: Normal   Collection Time   10/12/11 12:55 PM      Component Value Range Status Comment   MRSA by PCR NEGATIVE  NEGATIVE  Final     Medications:  Scheduled:    . aspirin EC  81 mg Oral Daily  . lisinopril  5 mg Oral Daily  . metoprolol      . metoprolol  2.5 mg Intravenous Once  . metoprolol tartrate  50 mg Oral BID  . potassium chloride  40 mEq Oral Once  . prasugrel  10 mg Oral Daily  . rosuvastatin  40 mg Oral Daily  . DISCONTD: metoprolol tartrate  25 mg Oral BID   Infusions:    . sodium chloride 1 mL/kg/hr (10/12/11 1046)  . sodium chloride 10 mL/hr at 10/13/11 0600  .  nitroGLYCERIN Stopped (10/12/11 1800)  . DISCONTD: nitroGLYCERIN     Past Medical History  Diagnosis Date  . HTN (hypertension)   . HLD (hyperlipidemia)    Assessment: Mr. River is a 45 year old gentleman with no prior history of CAD who presented 11/19 with complaints of chest pain over the last 24 hours. Cardiac enzymes were elevated, and cath showed TIMI 0 flow in the LAD. 2D echo showed an EF ~40%.  Goal of Therapy: 1. Anterior STEMI - Non-DES placed in LAD and produced TIMI ~1-2 flow. Now on metoprolol 50 mg bid. Prasugrel 60 mg given in cath lab followed by 10 mg daily dose. Rosuvastatin 40 mg started 11/19, cholesterol 172, tg 61, hdl 70, ldl 90. Also started on aspirin 81 mg daily and lisinopril 5 mg daily.  2. Hypertension - BP stable 120-125/75-85 on above meds.  3. Hyperlipidemia - Addressed with rosuvastatin.  4. Hypokalemia - Potassium chloride 40 meq po daily. Will f/u daily as necessary.  Plan:  Patient looks good from a pharmacologic standpoint. 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 Will continue to monitor while  in the ICU. Thank you.  Katrinka Blazing, [place] R 10/13/2011,9:48 AM

## 2011-10-14 ENCOUNTER — Encounter (HOSPITAL_COMMUNITY): Payer: Self-pay | Admitting: Internal Medicine

## 2011-10-14 DIAGNOSIS — I2109 ST elevation (STEMI) myocardial infarction involving other coronary artery of anterior wall: Secondary | ICD-10-CM | POA: Diagnosis present

## 2011-10-14 DIAGNOSIS — R509 Fever, unspecified: Secondary | ICD-10-CM | POA: Diagnosis not present

## 2011-10-14 DIAGNOSIS — I10 Essential (primary) hypertension: Secondary | ICD-10-CM | POA: Diagnosis present

## 2011-10-14 DIAGNOSIS — E785 Hyperlipidemia, unspecified: Secondary | ICD-10-CM | POA: Diagnosis present

## 2011-10-14 DIAGNOSIS — K5900 Constipation, unspecified: Secondary | ICD-10-CM | POA: Diagnosis not present

## 2011-10-14 LAB — BASIC METABOLIC PANEL
CO2: 25 mEq/L (ref 19–32)
Calcium: 9 mg/dL (ref 8.4–10.5)
Creatinine, Ser: 1.08 mg/dL (ref 0.50–1.35)
GFR calc non Af Amer: 82 mL/min — ABNORMAL LOW (ref >90)
Sodium: 131 mEq/L — ABNORMAL LOW (ref 135–145)

## 2011-10-14 LAB — CBC
MCH: 33.5 pg (ref 26.0–34.0)
MCV: 91.5 fL (ref 78.0–100.0)
Platelets: 188 10*3/uL (ref 150–400)
RBC: 4.72 MIL/uL (ref 4.22–5.81)
RDW: 13.4 % (ref 11.5–15.5)
WBC: 11.3 10*3/uL — ABNORMAL HIGH (ref 4.0–10.5)

## 2011-10-14 MED ORDER — POTASSIUM CHLORIDE CRYS ER 20 MEQ PO TBCR
40.0000 meq | EXTENDED_RELEASE_TABLET | Freq: Once | ORAL | Status: AC
  Administered 2011-10-14: 40 meq via ORAL
  Filled 2011-10-14: qty 2

## 2011-10-14 MED ORDER — ENOXAPARIN SODIUM 40 MG/0.4ML ~~LOC~~ SOLN
40.0000 mg | SUBCUTANEOUS | Status: DC
  Administered 2011-10-14 – 2011-10-16 (×3): 40 mg via SUBCUTANEOUS
  Filled 2011-10-14 (×5): qty 0.4

## 2011-10-14 MED ORDER — POLYETHYLENE GLYCOL 3350 17 G PO PACK
17.0000 g | PACK | Freq: Every day | ORAL | Status: DC
  Administered 2011-10-14 – 2011-10-21 (×5): 17 g via ORAL
  Filled 2011-10-14 (×8): qty 1

## 2011-10-14 MED ORDER — DOCUSATE SODIUM 100 MG PO CAPS
100.0000 mg | ORAL_CAPSULE | Freq: Two times a day (BID) | ORAL | Status: DC
  Administered 2011-10-14 – 2011-10-21 (×13): 100 mg via ORAL
  Filled 2011-10-14 (×17): qty 1

## 2011-10-14 MED ORDER — ENOXAPARIN SODIUM 40 MG/0.4ML ~~LOC~~ SOLN: 40.0000 mg | SUBCUTANEOUS | Status: DC

## 2011-10-14 NOTE — Progress Notes (Signed)
CARDIAC REHAB PHASE I   PRE:  Rate/Rhythm: 95SR  BP:  Supine: 110/68  Sitting:   Standing:    SaO2: 99%RA  MODE:  Ambulation: 250 ft   POST:  Rate/Rhythem: 103ST  BP:  Supine:    Sitting: 124/71  Standing:    SaO2: 97%RA  Pt ambulated 250 ft with slow steady gait. States a little discomfort over heart with movement.  Denied CP. C/o slight SOB. O2 sats stable. To recliner after walk. Ed completed except ex and Phase 2. [id number]  Duanne Limerick

## 2011-10-14 NOTE — Progress Notes (Signed)
PROGRESS NOTE   Subjective:  There were no events overnight. However, pt did have a fever yesterday, recorded at 102.3F, although it's not clear if a physician was notified. UA was sent and CXR that were negative for signs of infection. Currently, the patient confirms symptoms of sweats, left-sided achy chest pain that occurs when he moves positions without associated nauseas, vomiting, diaphoresis, shortness of breath. CP is improved even from yesterday. He denies symptoms of dyspnea, fatigue, orthopnea, palpitations and paroxysmal nocturnal dyspnea, chills, dysuria, hematuria, cough, sore throat, ear pain, ear discharge.     Objective:  Vital Signs:   BP 108/74  Pulse 97  Temp(Src) 100.4 F (38 C) (Oral)  Resp 23  Ht 5\' 7"  (1.702 m)  Wt 168 lb 6.9 oz (76.4 kg)  BMI 26.38 kg/m2  SpO2 100%    Physical Exam: General: Vital signs reviewed and noted. Well-developed, well-nourished, in no acute distress; alert, appropriate and cooperative throughout examination.  Lungs:  Normal respiratory effort. Clear to auscultation BL without crackles or wheezes.  Heart: RRR. S1 and S2 normal without gallop, murmur, or rubs.  Abdomen:  BS normoactive. Soft, Nondistended, non-tender.  No masses or organomegaly.  Extremities: No pretibial edema.     Labs: Basic Metabolic Panel: Pending BMET 11/21.  Lab 10/13/11 0520 10/12/11 0754 10/12/11 0752 10/12/11 0650  NA 134* 129* 138 --  K 3.8 2.8* 2.9* --  CL 97 91* 99 --  CO2 28 -- -- 30  GLUCOSE 113* 123* 121* --  BUN 12 7 8  --  CREATININE 1.05 0.90 1.10 --  CALCIUM 9.3 -- -- 10.0  MG -- -- -- --  PHOS -- -- -- --    Liver Function Tests:  Lab 10/12/11 0650  AST 255*255*  ALT 60*59*  ALKPHOS 6465  BILITOT 0.60.6  PROT 8.07.8  ALBUMIN 4.44.5   CBC:  Lab 10/13/11 0520 10/12/11 0754 10/12/11 0752 10/12/11 0650  WBC 11.8* -- -- 10.0  NEUTROABS -- -- -- --  HGB 14.5 14.3 16.7 --  HCT 40.7 42.0 49.0 --  MCV 91.9 -- -- 90.3    PLT 187 -- -- 249    Cardiac Enzymes:  Lab 10/13/11 0928 10/12/11 1745 10/12/11 1144 10/12/11 0650  CKTOTAL 1473* 3041* 3957* 3939*  CKMB 29.0* 108.0* 279.8* 343.0*  CKMBINDEX -- -- -- --  TROPONINI -- >25.00* >25.00* >25.00*    Microbiology: Results for orders placed during the hospital encounter of 10/12/11  MRSA PCR SCREENING     Status: Normal   Collection Time   10/12/11 12:55 PM      Component Value Range Status Comment   MRSA by PCR NEGATIVE  NEGATIVE  Final     Coagulation Studies:  Basename 10/12/11 0650  LABPROT 13.7  INR 1.03     Urinalysis:   Ref. Range 10/13/2011 03:12  Color, Urine Latest Range: YELLOW  YELLOW  Appearance Latest Range: CLEAR  CLEAR  Specific Gravity, Urine Latest Range: 1.005-1.030  1.012  pH Latest Range: 5.0-8.0  6.0  Glucose, UA Latest Range: NEGATIVE mg/dL NEGATIVE  Bilirubin Urine Latest Range: NEGATIVE  NEGATIVE  Ketones, ur Latest Range: NEGATIVE mg/dL NEGATIVE  Protein Latest Range: NEGATIVE mg/dL 30 (A)  Urobilinogen, UA Latest Range: 0.0-1.0 mg/dL 0.2  Nitrite Latest Range: NEGATIVE  NEGATIVE  Leukocytes, UA Latest Range: NEGATIVE  NEGATIVE  WBC, UA Latest Range: <3 WBC/hpf 0-2  RBC / HPF Latest Range: <3 RBC/hpf 0-2  Squamous Epithelial / LPF Latest Range: RARE  RARE  Bacteria, UA Latest Range: RARE  RARE    Other results: EKG (10/13/2011) - normal sinus rhythm, rate of approximately 100 bpm, left  axis, ST elevations in I, aVL, V6, V2-V6,   EKG (10/12/2011) - normal sinus rhythm, rate of approximately 100 bpm, left axis, ST elevation in II, III, aVF, Q's noted III, avF, and V3-V4.   Imaging:  Dg Chest Port 1 View (10/13/2011) - Suboptimal inspiration.  No acute cardiopulmonary disease.   Dg Chest Port 1 View (10/12/2011)  -  No acute abnormalities.    Cardiac catheterization (10/12/2011) -   Right coronary dominance  Left mainstem - mild ostial narrowing 20%  LAD - totally occluded just beyond origin of  second diagonal branch. At the site of occlusion, the vessel was dissected with TIMI 0 flow. Following wiring, and balloon dilation, the vessel was opened demonstrating infarction related dissection. Poor distal runoff.   LAD - non-drug-eluting mini vision stent 2mm x 28 mm with residual TIMI 1 flow into small caliber apical vessel (Per Dr. Elease Hashimoto) . Thought to be most consistent with edematous apex due to delayed presentation.  Left circumflex - 2 major marginal branches, no significant disease  RCA - Anterior takeoff from the left coronary cusp. It will require an AMPLATZ in the future to engage.   Left ventriculography - not performed.   2D-Echocardiogram (10/12/2011) - LV - severe hypokinesis of apical anterior, apical lateral, apical septal segments. Hypokinesis of apical inferior segment. EF 40% with vigorous motion of non-affected segments. Grade 1 diastolic dysfunction.    Medications:  Infusions:    . sodium chloride 10 mL/hr at 10/13/11 0900  . nitroGLYCERIN Stopped (10/12/11 1800)    Scheduled Medications:    . aspirin EC  81 mg Oral Daily  . lisinopril  5 mg Oral Daily  . metoprolol tartrate  50 mg Oral BID  . prasugrel  10 mg Oral Daily  . rosuvastatin  40 mg Oral Daily  . DISCONTD: metoprolol tartrate  25 mg Oral BID    PRN Medications: acetaminophen, ALPRAZolam, morphine, nitroGLYCERIN, ondansetron (ZOFRAN) IV, zolpidem    Assessment/ Plan:   Pt is a 45 y.o. yo nonsmoking male with a PMHx of HTN, HLD, and family history of early MI (Brother, age 67) who was admitted on 10/12/2011 with symptoms of chest pain that began on day prior to admission, which was determined to be secondary to acute anterior and inferior STEMI. He had emergent heart cath on 11/19 showing total occlusion of the LAD just beyond origin of second diagonal branch, requiring placement of a BMS stent, however, only TIMI 1 flow could be restored. Interventions at this time will be focused on  optimizing risk factor management, monitoring for signs of rupture.   Anterior STEMI - pt had a delayed presentation with EKG changes on admission showing anterior STEMI. His risk factors for cardiac disease include HLD, HTN, and family history of early MI. He was taken emergently to cath lab and is now s/p BMS to his LAD, which was totally occluded. He was also thought to have likely a infarction related dissection.   Continue BB, ASA, morphine, statin.  Continue Effient.   Plan re-echo on 10/16/2011 for evaluation of potential for apical clot formation with consideration to start coumadin therapy as he is higher risk for apical aneurysm and thrombus formation.   HLD - continue statin, LDL may be artificially low during acute STEMI (not on therapy at home), therefore, will have to again  evaluate as an outpatient with further adjustment of therapy as indicated.  Continue statin.   HTN - BP stable. However, HR continued to be elevated on 11/19, therefore, Metoprolol dosage was increased to 50 mg BID. Also Lisinopril was added on 11/19. HR was subsequently better controlled 70-90s.  Can consider further escalation of BB soon as tolerated by BP if his HR remains elevated, but must avoid hypotension.  Continue Lisinopril.   Fever - likely in the setting of his acute infarction, clear source of infection is identified at this time as urinalysis, chest x-ray are negative for signs of infection. The patient further denies cough, sore throat, nausea, vomiting. We will therefore, continue to monitor for sources of infection with consideration of obtaining blood culture if he continues to have high-grade fevers.  Continue to monitor, check blood cultures and CBC to evaluate for worsening leukocytosis.   Constipation - likely secondary to narcotic use for his chest pain.  Will add scheduled Colace, when necessary MiraLax.   Disposition - transfer out of unit today, preferably to  2000.   This patient's case and plan of care was discussed with attending, Shawnie Pons, M.D.   Length of Stay: 2   Johnette Abraham, Norman Herrlich, Internal Medicine Resident 10/14/2011, 6:21 AM    I have reviewed the evaluation and examined the patient with Dr. Saralyn Pilar.  I concur with her evaluation, and plans.  We will transfer the patient today.  BC times two will be obtained.  One line will be removed.  Echo will be repeated in the morning.  Will begin low dose enox, and continue other therapies.  I have reviewed this with the patient, and reviewed his hospital course.    Shawnie Pons 10/14/2011 8:30 AM

## 2011-10-15 LAB — CBC
Hemoglobin: 14 g/dL (ref 13.0–17.0)
Platelets: 198 10*3/uL (ref 150–400)
RBC: 4.38 MIL/uL (ref 4.22–5.81)
WBC: 8.3 10*3/uL (ref 4.0–10.5)

## 2011-10-15 LAB — BASIC METABOLIC PANEL
CO2: 29 mEq/L (ref 19–32)
Calcium: 9 mg/dL (ref 8.4–10.5)
GFR calc non Af Amer: 76 mL/min — ABNORMAL LOW (ref >90)
Potassium: 3.3 mEq/L — ABNORMAL LOW (ref 3.5–5.1)
Sodium: 134 mEq/L — ABNORMAL LOW (ref 135–145)

## 2011-10-15 MED ORDER — BISACODYL 10 MG RE SUPP: 10.0000 mg | Freq: Every day | RECTAL | Status: DC | PRN

## 2011-10-15 MED ORDER — POTASSIUM CHLORIDE CRYS ER 20 MEQ PO TBCR
40.0000 meq | EXTENDED_RELEASE_TABLET | Freq: Once | ORAL | Status: AC
  Administered 2011-10-15: 40 meq via ORAL
  Filled 2011-10-15: qty 2

## 2011-10-15 NOTE — Progress Notes (Addendum)
Ronie Spies, PA notified of patients temperature and chest pain, that patient was given tylenol 650 mg PO and put on 2 liters oxygen via nasal canula; told to continue with tylenol. Will continue to monitor patient.

## 2011-10-15 NOTE — Progress Notes (Signed)
Subjective:  Doing pretty well. Ambulating. Still has mild chest pain.   Objective:  Vital Signs in the last 24 hours: Temp:  [99.3 F (37.4 C)-100.1 F (37.8 C)] 99.8 F (37.7 C) (11/22 0512) Pulse Rate:  [90-94] 92  (11/22 0512) Resp:  [18-27] 20  (11/22 0512) BP: (95-124)/(58-77) 120/77 mmHg (11/22 0512) SpO2:  [93 %-100 %] 96 % (11/22 0512)  Intake/Output from previous day: 11/21 0701 - 11/22 0700 In: 360 [P.O.:360] Out: 350 [Urine:350]   Physical Exam: General: Well developed, well nourished, in no acute distress. Head:  Normocephalic and atraumatic. Lungs: Clear to auscultation and percussion. Heart: Normal S1 and S2.  No murmur, rubs or gallops.  Carefully ausculted, and no rub appreciated.   Pulses: Pulses normal in all 4 extremities. Extremities: No clubbing or cyanosis. No edema. Neurologic: Alert and oriented x 3.    Lab Results:  Basename 10/15/11 0630 10/14/11 1140  WBC 8.3 11.3*  HGB 14.0 15.8  PLT 198 188    Basename 10/15/11 0630 10/14/11 0629  NA 134* 131*  K 3.3* 3.3*  CL 96 95*  CO2 29 25  GLUCOSE 145* 160*  BUN 18 16  CREATININE 1.16 1.08    Basename 10/12/11 1745 10/12/11 1144  TROPONINI >25.00* >25.00*   Hepatic Function Panel No results found for this basename: PROT,ALBUMIN,AST,ALT,ALKPHOS,BILITOT,BILIDIR,IBILI in the last 72 hours  Basename 10/13/11 0520  CHOL 172   Blood cultures negative so far. Imaging: No results found.      Assessment/Plan:  Patient Active Hospital Problem List: ST elevation myocardial infarction (STEMI) of anterior wall (10/14/2011)   Assessment: Chest pain much less.  Still minor, some with inspiration   Plan: Continue to ambulate.  Repeat 12 lead tracing.  Anatomic findings reviewed again with patient and family in detail.  Late presentation with minimal distal flow despite reperfusion treatment.  Plan repeat echo tomorrow to rule out LV mural thrombus, then home Saturday if stable.   Hypertension  (10/14/2011)   Assessment: stable   Plan: no change in meds HLD (hyperlipidemia) ()   Assessment: See notes   Plan: Continue statin Fever (10/14/2011)   Assessment: not as high.  BC negative.  UA and CXR ok.  Does not look toxic.  Remaining IV line looks good.     Plan: Continue to monitor.  No antibiotics in absence of documented infection. Constipation (10/14/2011)   Assessment: noted   Plan: ducolax suppository as needed Hypokalemia   Plan: additional PO KCL.       Shawnie Pons, MD, [facility], FSCAI 10/15/2011, 10:06 AM

## 2011-10-16 ENCOUNTER — Other Ambulatory Visit: Payer: Self-pay

## 2011-10-16 ENCOUNTER — Inpatient Hospital Stay (HOSPITAL_COMMUNITY): Payer: BC Managed Care – PPO

## 2011-10-16 DIAGNOSIS — I517 Cardiomegaly: Secondary | ICD-10-CM

## 2011-10-16 LAB — CBC
HCT: 38.4 % — ABNORMAL LOW (ref 39.0–52.0)
Hemoglobin: 13.7 g/dL (ref 13.0–17.0)
MCV: 90.8 fL (ref 78.0–100.0)
WBC: 6.7 10*3/uL (ref 4.0–10.5)

## 2011-10-16 MED ORDER — WARFARIN VIDEO
Freq: Once | Status: AC
  Administered 2011-10-16: 19:00:00
  Filled 2011-10-16: qty 1

## 2011-10-16 MED ORDER — COUMADIN BOOK
Freq: Once | Status: AC
  Administered 2011-10-16: 19:00:00
  Filled 2011-10-16: qty 1

## 2011-10-16 MED ORDER — CLOPIDOGREL BISULFATE 75 MG PO TABS
75.0000 mg | ORAL_TABLET | Freq: Every day | ORAL | Status: DC
  Administered 2011-10-17 – 2011-10-21 (×5): 75 mg via ORAL
  Filled 2011-10-16 (×6): qty 1

## 2011-10-16 MED ORDER — HEPARIN (PORCINE) IN NACL 100-0.45 UNIT/ML-% IJ SOLN
10.0000 [IU]/kg/h | INTRAMUSCULAR | Status: DC
  Administered 2011-10-16: 10 [IU]/kg/h via INTRAVENOUS
  Filled 2011-10-16: qty 250

## 2011-10-16 MED ORDER — WARFARIN SODIUM 7.5 MG PO TABS
7.5000 mg | ORAL_TABLET | Freq: Once | ORAL | Status: DC
  Filled 2011-10-16 (×2): qty 1

## 2011-10-16 NOTE — Progress Notes (Signed)
CARDIAC REHAB PHASE I   PRE:  Rate/Rhythm: 83 SR  BP:  Supine:   Sitting: 122/64  Standing:    SaO2: 96 RA   MODE:  Ambulation: 800 ft  POST:  Rate/Rhythem: 85 SR  BP:  Supine:  Sitting: 108/66         SaO2: 98 RA  Rodney Marquez  Pt ambulated 800 ft independently. Tolerated well, no complaints. Pt instructed to ambulate independently two more times today. D/C education completed. Will sign off for now. Harriett Sine [facility] 10/16/11 1035

## 2011-10-16 NOTE — Progress Notes (Signed)
Subjective:  Doing pretty well.  Notes from last night reviewed.  This am he says he has no chest pain, chills, cough, or dysuria.  Fever curves reviewed.  Wbc is down.    Objective:  Vital Signs in the last 24 hours: Temp:  [99 F (37.2 C)-101.6 F (38.7 C)] 99 F (37.2 C) (11/23 0523) Pulse Rate:  [86-93] 86  (11/23 0523) Resp:  [16-18] 18  (11/23 0523) BP: (105-114)/(67-76) 107/76 mmHg (11/23 0523) SpO2:  [95 %-98 %] 98 % (11/23 0523)  Intake/Output from previous day: 11/22 0701 - 11/23 0700 In: 360 [P.O.:360] Out: -    Physical Exam: General: Well developed, well nourished, in no acute distress. Head:  Normocephalic and atraumatic. Lungs: Clear to auscultation and percussion. Heart: Normal S1 and S2.  No murmur, rubs or gallops.  Pulses: Pulses normal in all 4 extremities. Extremities: No clubbing or cyanosis. No edema. Neurologic: Alert and oriented x 3.    Lab Results:  Basename 10/16/11 0600 10/15/11 0630  WBC 6.7 8.3  HGB 13.7 14.0  PLT 226 198    Basename 10/15/11 0630 10/14/11 0629  NA 134* 131*  K 3.3* 3.3*  CL 96 95*  CO2 29 25  GLUCOSE 145* 160*  BUN 18 16  CREATININE 1.16 1.08   No results found for this basename: TROPONINI:2,CK,MB:2 in the last 72 hours Hepatic Function Panel No results found for this basename: PROT,ALBUMIN,AST,ALT,ALKPHOS,BILITOT,BILIDIR,IBILI in the last 72 hours No results found for this basename: CHOL in the last 72 hours No results found for this basename: PROTIME in the last 72 hours  Imaging: No results found.  EKG:  Awaiting tracing.  Cardiac Studies:  No further studies  Assessment/Plan:  Patient Active Hospital Problem List: ST elevation myocardial infarction (STEMI) of anterior wall (10/14/2011)   Assessment: No pain today.  Looks good and does not look toxic.  Fever likely secondary to completed anterior infarct.   Plan: Continued observation, and continuation of current meds.  Hypertension (10/14/2011)  Assessment: stable   Plan: continue meds HLD (hyperlipidemia) ()   Assessment: see labs   Plan: meds as ordered Fever (10/14/2011)   Assessment: No focal signs of infection.  WBC lower.  UA negative.  IV sites without erythema.  BC negative so far.    Plan: Repeat PA and later CXR today Constipation (10/14/2011)   Assessment: Still no BM   Plan: He thinks he is about to go.        Shawnie Pons, MD, [facility], FSCAI 10/16/2011, 8:12 AM

## 2011-10-16 NOTE — Progress Notes (Signed)
Utilization review completed. Kaelynn Igo, RN, BSN. 10/16/11 

## 2011-10-16 NOTE — Progress Notes (Signed)
*  PRELIMINARY RESULTS* Echocardiogram 2D Echocardiogram has been performed.  Clide Deutscher RDCS 10/16/2011, 3:07 PM

## 2011-10-16 NOTE — Progress Notes (Signed)
Case discussed with Dr. Shirlee Latch.  He now has, in comparison to the prior echo, evidence of LV mural thrombus, as we were concerned he might develop.  This is on low dose lovenox, ASA, and prasugrel.   Will adjust meds at this point.  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  Plan three months of warfarin with restudy recommended at that time.  Plan reviewed with the patient who seems to understand.    Shawnie Pons, MD, [facility], FSCAI 10/16/2011, 5:25 PM

## 2011-10-16 NOTE — Progress Notes (Signed)
ANTICOAGULATION CONSULT NOTE - Initial Consult  Pharmacy Consult for Heparin and Coumadin Indication: LV Mural thrombus   Assessment: 45yoM being started on Heparin and Coumadin for LV mural thrombus which developed while on ASA, Prasugrel, and low-dose enoxaparin.  Per MD request, HL goal of 0.3-0.5; heparin dosing weight 76.4kg. Baseline INR 1.03.  Goal of Therapy:  Heparin level 0.3-0.5 (Per Dr. Riley Kill) INR goal 2-3   Plan:  Continue with Heparin 700 units/hr as ordered by Dr. Riley Kill Check 6-hour heparin level Follow-up daily CBC and Heparin Level  Warfarin 7.5mg  PO x 1 today Coumadin Book and video Follow-up daily INR   Sabra Heck 5:56 PM 10/16/2011     No Known Allergies  Patient Measurements: Height: 5\' 7"  (170.2 cm) Weight: 168 lb 6.9 oz (76.4 kg) IBW/kg (Calculated) : 66.1  Heparin weight: 76.4 kg  Vital Signs: Temp: 98.9 F (37.2 C) (11/23 1322) BP: 110/78 mmHg (11/23 1322) Pulse Rate: 82  (11/23 1322)  Labs:  Basename 10/16/11 0600 10/15/11 0630 10/14/11 1140 10/14/11 0629  HGB 13.7 14.0 -- --  HCT 38.4* 40.1 43.2 --  PLT 226 198 188 --  APTT -- -- -- --  LABPROT -- -- -- --  INR -- -- -- --  HEPARINUNFRC -- -- -- --  CREATININE -- 1.16 -- 1.08  CKTOTAL -- -- -- --  CKMB -- -- -- --  TROPONINI -- -- -- --   Estimated Creatinine Clearance: 76.8 ml/min (by C-G formula based on Cr of 1.16).  Medical History: Past Medical History  Diagnosis Date  . HTN (hypertension)   . HLD (hyperlipidemia)   . Family history of early CAD     Brother had MI at age 80 yo.    Jaela Yepez N 10/16/2011,5:48 PM

## 2011-10-17 LAB — COMPREHENSIVE METABOLIC PANEL
ALT: 125 U/L — ABNORMAL HIGH (ref 0–53)
Alkaline Phosphatase: 94 U/L (ref 39–117)
BUN: 14 mg/dL (ref 6–23)
CO2: 28 mEq/L (ref 19–32)
Chloride: 100 mEq/L (ref 96–112)
GFR calc Af Amer: >90 mL/min (ref >90)
GFR calc non Af Amer: >90 mL/min (ref >90)
Glucose, Bld: 103 mg/dL — ABNORMAL HIGH (ref 70–99)
Potassium: 4 mEq/L (ref 3.5–5.1)
Sodium: 136 mEq/L (ref 135–145)
Total Bilirubin: 0.6 mg/dL (ref 0.3–1.2)

## 2011-10-17 LAB — CBC
HCT: 37.5 % — ABNORMAL LOW (ref 39.0–52.0)
MCHC: 36.3 g/dL — ABNORMAL HIGH (ref 30.0–36.0)
Platelets: 238 10*3/uL (ref 150–400)
RDW: 13 % (ref 11.5–15.5)

## 2011-10-17 LAB — HEPARIN LEVEL (UNFRACTIONATED)
Heparin Unfractionated: <0.1 IU/mL — ABNORMAL LOW (ref 0.30–0.70)
Heparin Unfractionated: 0.13 IU/mL — ABNORMAL LOW (ref 0.30–0.70)
Heparin Unfractionated: 0.19 IU/mL — ABNORMAL LOW (ref 0.30–0.70)

## 2011-10-17 LAB — PROTIME-INR: Prothrombin Time: 13.8 seconds (ref 11.6–15.2)

## 2011-10-17 MED ORDER — WARFARIN SODIUM 7.5 MG PO TABS
7.5000 mg | ORAL_TABLET | Freq: Once | ORAL | Status: AC
  Administered 2011-10-17: 7.5 mg via ORAL
  Filled 2011-10-17: qty 1

## 2011-10-17 MED ORDER — HEPARIN BOLUS VIA INFUSION
2500.0000 [IU] | INTRAVENOUS | Status: AC
  Administered 2011-10-17: 2500 [IU] via INTRAVENOUS
  Filled 2011-10-17: qty 2500

## 2011-10-17 MED ORDER — HEPARIN (PORCINE) IN NACL 100-0.45 UNIT/ML-% IJ SOLN
1100.0000 [IU]/h | INTRAMUSCULAR | Status: DC
  Administered 2011-10-17: 900 [IU]/h via INTRAVENOUS
  Administered 2011-10-17: 1100 [IU]/h via INTRAVENOUS
  Filled 2011-10-17 (×2): qty 250

## 2011-10-17 MED ORDER — HEPARIN BOLUS VIA INFUSION
2500.0000 [IU] | Freq: Once | INTRAVENOUS | Status: AC
  Administered 2011-10-17: 2500 [IU] via INTRAVENOUS
  Filled 2011-10-17: qty 2500

## 2011-10-17 MED ORDER — HEPARIN (PORCINE) IN NACL 100-0.45 UNIT/ML-% IJ SOLN
1000.0000 [IU]/h | INTRAMUSCULAR | Status: DC
  Administered 2011-10-17 – 2011-10-20 (×4): 1350 [IU]/h via INTRAVENOUS
  Administered 2011-10-21: 1000 [IU]/h via INTRAVENOUS
  Administered 2011-10-21: 1050 [IU]/h via INTRAVENOUS
  Filled 2011-10-17 (×6): qty 250

## 2011-10-17 NOTE — Progress Notes (Signed)
Subjective:  Patient is doing well.  No chest pain.  Ambulating without difficulty.  Fever is down.    Objective:  Vital Signs in the last 24 hours: Temp:  [97.3 F (36.3 C)-98.9 F (37.2 C)] 97.3 F (36.3 C) (11/24 0500) Pulse Rate:  [74-93] 74  (11/24 0500) Resp:  [18-20] 18  (11/24 0500) BP: (110-127)/(77-79) 117/77 mmHg (11/24 0500) SpO2:  [98 %-99 %] 99 % (11/24 0500)  Intake/Output from previous day: 11/23 0701 - 11/24 0700 In: 720 [P.O.:720] Out: 350 [Urine:350]   Physical Exam: General: Well developed, well nourished, in no acute distress. Head:  Normocephalic and atraumatic. Lungs: Clear to auscultation and percussion.  I do not appreciate LLL rales Heart: Normal S1 and S2.  No murmur, rubs or gallops.  Pulses: Pulses normal in all 4 extremities. Extremities: No clubbing or cyanosis. No edema. Neurologic: Alert and oriented x 3. IV sites look good.  No erythema.    Lab Results:  Basename 10/17/11 0600 10/16/11 0600  WBC 6.1 6.7  HGB 13.6 13.7  PLT 238 226    Basename 10/17/11 0600 10/15/11 0630  NA 136 134*  K 4.0 3.3*  CL 100 96  CO2 28 29  GLUCOSE 103* 145*  BUN 14 18  CREATININE 0.99 1.16   No results found for this basename: TROPONINI:2,CK,MB:2 in the last 72 hours Hepatic Function Panel  Basename 10/17/11 0600  PROT 7.1  ALBUMIN 2.9*  AST 70*  ALT 125*  ALKPHOS 94  BILITOT 0.6  BILIDIR --  IBILI --   No results found for this basename: CHOL in the last 72 hours No results found for this basename: PROTIME in the last 72 hours  Imaging: Dg Chest 2 View  10/16/2011  *RADIOLOGY REPORT*  Clinical Data: Fever.  Status post cardiac catheterization.  CHEST - 2 VIEW  Comparison: Plain films of the chest 10/12/2011 and 10/13/2011.  Findings: The patient has left basilar airspace disease.  Right lung is clear.  Heart size upper normal.  IMPRESSION: Left basilar airspace disease worrisome for pneumonia.  Original Report Authenticated By: Bernadene Bell.  D'ALESSIO, M.D.    EKG:  NSR.  Anterolateral MI, recent.  Persistent ST elevation consistent with completed infarct  (late presentation MI) ECHO:  Impressions:  - Normal LV size with mild LV hypertrophy. EF 40-45% with akinesis of the apex and the peri-apical segments. The basal to mid LV segments contracted normally. 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 Moderate diastolic dysfunction. Normal RV size and systolic function. No significant valvular abnormalities.      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  Continue ASA and clopidogrel until INR therapeutic, then dc ASA.  Continue clopidogrel/warfarin  (WOEST). Hypertension (10/14/2011)   Assessment: Controlled   Plan: Observe HLD (hyperlipidemia) ()   Assessment: Elevated LFTs despite resolving infarct with flip in AST/ALT   Plan: Hold rosuvastatin, then recheck Mon, resume low dose prava if down Fever (10/14/2011)   Assessment: Largely resolved.  Basilar density by CXR, but no rales, cough, and wbc is lower.  Deep breaths recommended with increased activity  (likely atelectasis).   Plan: repeat CXR in am, with close observation for the development of pneumonia. Constipation (10/14/2011)   Assessment: Resolved   Plan: prn laxative      Shawnie Pons, MD, [facility],  FSCAI 10/17/2011, 8:12 AM

## 2011-10-17 NOTE — Progress Notes (Signed)
ANTICOAGULATION CONSULT NOTE - Follow Up Consult  Pharmacy Consult for Heparin/Coumadin Indication: Mural Thrombus  No Known Allergies  Patient Measurements: Height: 5\' 7"  (170.2 cm) Weight: 168 lb 6.9 oz (76.4 kg) IBW/kg (Calculated) : 66.1  Adjusted Body Weight:   Vital Signs: Temp: 97.3 F (36.3 C) (11/24 0500) Temp src: Oral (11/24 0500) BP: 117/77 mmHg (11/24 0500) Pulse Rate: 74  (11/24 0500)  Labs:  Basename 10/17/11 0600 10/17/11 0042 10/16/11 0600 10/15/11 0630  HGB 13.6 -- 13.7 --  HCT 37.5* -- 38.4* 40.1  PLT 238 -- 226 198  APTT -- -- -- --  LABPROT 13.8 -- -- --  INR 1.04 -- -- --  HEPARINUNFRC <0.10* 0.12* -- --  CREATININE 0.99 -- -- 1.16  CKTOTAL -- -- -- --  CKMB -- -- -- --  TROPONINI -- -- -- --   Estimated Creatinine Clearance: 90 ml/min (by C-G formula based on Cr of 0.99).   Medications:  Prescriptions prior to admission  Medication Sig Dispense Refill  . valsartan-hydrochlorothiazide (DIOVAN-HCT) 160-12.5 MG per tablet Take 1 tablet by mouth daily.          Assessment: Mr. Arch continues on IV heparin for mural thrombus that developed while on ASA/prasugrel. Heparin level undetectable after dose increase this AM. INR basically at baseline, no change expected since Coumadin dose was not given last PM- this was inadvertently scheduled to start 11/24 instead of 11/23. CBC stable.  Goal of Therapy:  INR 2-3 Heparin level 0.3-0.5 iu/ml   Plan:  1. Heparin 2500 units IV bolus x 1 now, then increase Heparin drip to 1100 units/hr; recheck heparin level in 6 hours (1700 today). 2. Coumadin 7.5mg  PO x 1 today 3. Will f/up daily INR.  Jakaleb Payer K. Allena Katz, PharmD, BCPS.  Clinical Pharmacist Pager [phone]. 10/17/2011 10:38 AM

## 2011-10-17 NOTE — Progress Notes (Signed)
ANTICOAGULATION CONSULT NOTE - Follow Up Consult  Pharmacy Consult for Heparin  Indication: Mural Thrombus  No Known Allergies  Patient Measurements: Height: 5\' 7"  (170.2 cm) Weight: 168 lb 6.9 oz (76.4 kg) IBW/kg (Calculated) : 66.1    Vital Signs: Temp: 98 F (36.7 C) (11/24 1458) BP: 108/68 mmHg (11/24 1458) Pulse Rate: 76  (11/24 1458)  Labs:  Basename 10/17/11 1621 10/17/11 1007 10/17/11 0600 10/16/11 0600 10/15/11 0630  HGB -- -- 13.6 13.7 --  HCT -- -- 37.5* 38.4* 40.1  PLT -- -- 238 226 198  APTT -- -- -- -- --  LABPROT -- -- 13.8 -- --  INR -- -- 1.04 -- --  HEPARINUNFRC 0.19* 0.13* <0.10* -- --  CREATININE -- -- 0.99 -- 1.16  CKTOTAL -- -- -- -- --  CKMB -- -- -- -- --  TROPONINI -- -- -- -- --   Estimated Creatinine Clearance: 90 ml/min (by C-G formula based on Cr of 0.99).   Medications:  Scheduled:    . aspirin EC  81 mg Oral Daily  . clopidogrel  75 mg Oral Q breakfast  . coumadin book   Does not apply Once  . docusate sodium  100 mg Oral BID  . heparin  2,500 Units Intravenous Once  . lisinopril  5 mg Oral Daily  . metoprolol tartrate  50 mg Oral BID  . polyethylene glycol  17 g Oral Daily  . warfarin  7.5 mg Oral ONCE-1800  . warfarin   Does not apply Once  . DISCONTD: rosuvastatin  40 mg Oral Daily  . DISCONTD: warfarin  7.5 mg Oral ONCE-1800    Assessment: 6hr Heparin level = 0.19 after 2500 unit IV heparin bolus and drip rate increased to 1100 units/hr.  Level increased but remains subtherapeutic.  Goal of Therapy:  heparin level 0.3-0.5 units/ml   Plan:  Heparin bolus 2500 units IV x 1 and increase heparin drip rate to 1350 units/hr. Check 6hr Heparin level.   Arman Filter 10/17/2011,5:34 PM

## 2011-10-17 NOTE — Progress Notes (Signed)
ANTICOAGULATION CONSULT NOTE - Follow Up Consult  Pharmacy Consult for heparin Indication: LV mural thrombus  No Known Allergies  Patient Measurements: Height: 5\' 7"  (170.2 cm) Weight: 168 lb 6.9 oz (76.4 kg) IBW/kg (Calculated) : 66.1   Vital Signs: Temp: 98.9 F (37.2 C) (11/23 2124) BP: 127/79 mmHg (11/23 2112) Pulse Rate: 93  (11/23 2112)  Labs:  Basename 10/17/11 0042 10/16/11 0600 10/15/11 0630 10/14/11 1140 10/14/11 0629  HGB -- 13.7 14.0 -- --  HCT -- 38.4* 40.1 43.2 --  PLT -- 226 198 188 --  APTT -- -- -- -- --  LABPROT -- -- -- -- --  INR -- -- -- -- --  HEPARINUNFRC 0.12* -- -- -- --  CREATININE -- -- 1.16 -- 1.08  CKTOTAL -- -- -- -- --  CKMB -- -- -- -- --  TROPONINI -- -- -- -- --   Estimated Creatinine Clearance: 76.8 ml/min (by C-G formula based on Cr of 1.16).  Medications:  Scheduled:    . aspirin EC  81 mg Oral Daily  . clopidogrel  75 mg Oral Q breakfast  . coumadin book   Does not apply Once  . docusate sodium  100 mg Oral BID  . lisinopril  5 mg Oral Daily  . metoprolol tartrate  50 mg Oral BID  . polyethylene glycol  17 g Oral Daily  . rosuvastatin  40 mg Oral Daily  . warfarin  7.5 mg Oral ONCE-1800  . warfarin   Does not apply Once  . DISCONTD: enoxaparin (LOVENOX) injection  40 mg Subcutaneous Q24H  . DISCONTD: prasugrel  10 mg Oral Daily   Infusions:    . sodium chloride 10 mL/hr at 10/13/11 0900  . heparin    . DISCONTD: heparin 10 Units/kg/hr (10/16/11 1846)   Assessment: 45yo male subtherapeutic on heparin with initial dosing for LV mural thrombus with low goal.  Goal of Therapy:  Heparin level 0.3-0.5   Plan:  Will increase gtt by ~2-3 units/kg/hr to 900 units/hr and check level in 6hr.  Colleen Can PharmD BCPS 10/17/2011,2:15 AM

## 2011-10-18 ENCOUNTER — Inpatient Hospital Stay (HOSPITAL_COMMUNITY): Payer: BC Managed Care – PPO

## 2011-10-18 LAB — CBC
HCT: 36 % — ABNORMAL LOW (ref 39.0–52.0)
MCHC: 35.8 g/dL (ref 30.0–36.0)
MCV: 90.7 fL (ref 78.0–100.0)
RDW: 12.9 % (ref 11.5–15.5)

## 2011-10-18 LAB — COMPREHENSIVE METABOLIC PANEL
ALT: 170 U/L — ABNORMAL HIGH (ref 0–53)
Albumin: 3 g/dL — ABNORMAL LOW (ref 3.5–5.2)
Alkaline Phosphatase: 86 U/L (ref 39–117)
Glucose, Bld: 101 mg/dL — ABNORMAL HIGH (ref 70–99)
Potassium: 4 mEq/L (ref 3.5–5.1)
Sodium: 135 mEq/L (ref 135–145)
Total Protein: 6.7 g/dL (ref 6.0–8.3)

## 2011-10-18 LAB — PROTIME-INR: Prothrombin Time: 13.9 seconds (ref 11.6–15.2)

## 2011-10-18 MED ORDER — WARFARIN SODIUM 7.5 MG PO TABS
7.5000 mg | ORAL_TABLET | Freq: Once | ORAL | Status: AC
  Administered 2011-10-18: 7.5 mg via ORAL
  Filled 2011-10-18: qty 1

## 2011-10-18 NOTE — Progress Notes (Signed)
ANTICOAGULATION CONSULT NOTE - Follow Up Consult  Pharmacy Consult for Heparin  Indication: Mural Thrombus  No Known Allergies  Patient Measurements: Height: 5\' 7"  (170.2 cm) Weight: 168 lb 6.9 oz (76.4 kg) IBW/kg (Calculated) : 66.1    Vital Signs: Temp: 99.1 F (37.3 C) (11/24 2043) Temp src: Oral (11/24 2043) BP: 104/74 mmHg (11/24 2043) Pulse Rate: 84  (11/24 2043)  Labs:  Basename 10/18/11 0015 10/17/11 1621 10/17/11 1007 10/17/11 0600 10/16/11 0600 10/15/11 0630  HGB -- -- -- 13.6 13.7 --  HCT -- -- -- 37.5* 38.4* 40.1  PLT -- -- -- 238 226 198  APTT -- -- -- -- -- --  LABPROT -- -- -- 13.8 -- --  INR -- -- -- 1.04 -- --  HEPARINUNFRC 0.46 0.19* 0.13* -- -- --  CREATININE -- -- -- 0.99 -- 1.16  CKTOTAL -- -- -- -- -- --  CKMB -- -- -- -- -- --  TROPONINI -- -- -- -- -- --   Estimated Creatinine Clearance: 90 ml/min (by C-G formula based on Cr of 0.99).  Medications:  Scheduled:    . aspirin EC  81 mg Oral Daily  . clopidogrel  75 mg Oral Q breakfast  . docusate sodium  100 mg Oral BID  . heparin  2,500 Units Intravenous Once  . heparin  2,500 Units Intravenous NOW  . lisinopril  5 mg Oral Daily  . metoprolol tartrate  50 mg Oral BID  . polyethylene glycol  17 g Oral Daily  . warfarin  7.5 mg Oral ONCE-1800  . DISCONTD: rosuvastatin  40 mg Oral Daily  . DISCONTD: warfarin  7.5 mg Oral ONCE-1800   Assessment: 45yo male now therapeutic on heparin for LV mural thrombus.  Goal of Therapy:  heparin level 0.3-0.5 units/ml   Plan:  Will continue gtt at current rate and confirm stable with am labs.  Colleen Can PharmD BCPS 10/18/2011,1:33 AM

## 2011-10-18 NOTE — Progress Notes (Signed)
Subjective:  He feels good.  Just one IV line in at this point. BC neg to date.  No shortness of breath or chest pain. No cough, chills, or dysuria.    Objective:  Vital Signs in the last 24 hours: Temp:  [98 F (36.7 C)-99.2 F (37.3 C)] 99.2 F (37.3 C) (11/25 0500) Pulse Rate:  [76-84] 78  (11/25 0500) Resp:  [18] 18  (11/25 0500) BP: (104-119)/(61-74) 119/61 mmHg (11/25 0500) SpO2:  [96 %-100 %] 96 % (11/25 0500)  Intake/Output from previous day: 11/24 0701 - 11/25 0700 In: 1242 [P.O.:1080; I.V.:162] Out: 1700 [Urine:1700]   Physical Exam: General: Well developed, well nourished, in no acute distress. Head:  Normocephalic and atraumatic. Lungs: Clear to auscultation and percussion, except minimal decrease in left base without rales. Heart: Normal S1 and S2.  No murmur, rubs or gallops. Abdomen:  Soft.  No HS megaly.  Normal BS.  No tenderness.   Pulses: Pulses normal in all 4 extremities. Extremities: No clubbing or cyanosis. No edema. Neurologic: Alert and oriented x 3.    Lab Results:  Basename 10/18/11 0600 10/17/11 0600  WBC 7.0 6.1  HGB 12.9* 13.6  PLT 251 238    Basename 10/18/11 0600 10/17/11 0600  NA 135 136  K 4.0 4.0  CL 101 100  CO2 28 28  GLUCOSE 101* 103*  BUN 12 14  CREATININE 1.00 0.99   No results found for this basename: TROPONINI:2,CK,MB:2 in the last 72 hours Hepatic Function Panel  Basename 10/18/11 0600  PROT 6.7  ALBUMIN 3.0*  AST 90*  ALT 170*  ALKPHOS 86  BILITOT 0.4  BILIDIR --  IBILI --   No results found for this basename: CHOL in the last 72 hours No results found for this basename: PROTIME in the last 72 hours  Imaging: Dg Chest 2 View  10/16/2011  *RADIOLOGY REPORT*  Clinical Data: Fever.  Status post cardiac catheterization.  CHEST - 2 VIEW  Comparison: Plain films of the chest 10/12/2011 and 10/13/2011.  Findings: The patient has left basilar airspace disease.  Right lung is clear.  Heart size upper normal.   IMPRESSION: Left basilar airspace disease worrisome for pneumonia.  Original Report Authenticated By: Bernadene Bell. Maricela Curet, M.D.      Assessment/Plan:  Patient Active Hospital Problem List: ST elevation myocardial infarction (STEMI) of anterior wall (10/14/2011)   Assessment: Stable.  No pain.  Feels good.  Ambulates without difficulty.   Plan: IV heparin, warfarin.   Hypertension (10/14/2011)   Assessment: Stable   Plan: monitor HLD (hyperlipidemia) ()   Assessment: elevated LFT.  Normal Exam, but rising.  No def hepatoxic meds, and PA pressure, CVP normal by echo.  No hepatomegaly.   Plan: [place] of abdomen tomorrow.  Continue to monitor and recheck liver profile.  Continue statins on hold.   Fever (10/14/2011)   Assessment: No obvious source.  Continue to monitor.     Plan: Recheck CXR.  IV line sites look good, just one in place, others look stable.  No erythema.  CBC ok. Constipation (10/14/2011)   Assessment: Resolved         Shawnie Pons, MD, [facility], [facility] 10/18/2011, 8:46 AM

## 2011-10-18 NOTE — Progress Notes (Signed)
ANTICOAGULATION CONSULT NOTE - Follow Up Consult  Pharmacy Consult for Heparin  Indication: Mural Thrombus  No Known Allergies  Patient Measurements: Height: 5\' 7"  (170.2 cm) Weight: 168 lb 6.9 oz (76.4 kg) IBW/kg (Calculated) : 66.1    Vital Signs: Temp: 99.2 F (37.3 C) (11/25 0500) Temp src: Oral (11/25 0500) BP: 119/61 mmHg (11/25 0500) Pulse Rate: 78  (11/25 0500)  Labs:  Basename 10/18/11 0600 10/18/11 0015 10/17/11 1621 10/17/11 0600 10/16/11 0600  HGB 12.9* -- -- 13.6 --  HCT 36.0* -- -- 37.5* 38.4*  PLT 251 -- -- 238 226  APTT -- -- -- -- --  LABPROT 13.9 -- -- 13.8 --  INR 1.05 -- -- 1.04 --  HEPARINUNFRC 0.50 0.46 0.19* -- --  CREATININE 1.00 -- -- 0.99 --  CKTOTAL -- -- -- -- --  CKMB -- -- -- -- --  TROPONINI -- -- -- -- --   Estimated Creatinine Clearance: 89.1 ml/min (by C-G formula based on Cr of 1).   Medications:  Scheduled:     . aspirin EC  81 mg Oral Daily  . clopidogrel  75 mg Oral Q breakfast  . docusate sodium  100 mg Oral BID  . heparin  2,500 Units Intravenous NOW  . lisinopril  5 mg Oral Daily  . metoprolol tartrate  50 mg Oral BID  . polyethylene glycol  17 g Oral Daily  . warfarin  7.5 mg Oral ONCE-1800    Assessment:  Day #2/66minimun overlap Heparin /coumadin. Today Heparin level = 0.5 on 1350 units/hr.  Therapeutic heparin level. INR = 1.05 after 1st dose of coumadin yesterday.    Goal of Therapy:  heparin level 0.3-0.5 units/ml and INR 2-3   Plan:  Continue Heparin drip rate 1350 units/hr. Give Coumadin 7.5mg  today.  Check daily  Heparin level/CBC/INR.   Arman Filter 10/18/2011,12:43 PM

## 2011-10-19 LAB — COMPREHENSIVE METABOLIC PANEL
Alkaline Phosphatase: 86 U/L (ref 39–117)
BUN: 12 mg/dL (ref 6–23)
CO2: 28 mEq/L (ref 19–32)
Chloride: 103 mEq/L (ref 96–112)
GFR calc Af Amer: >90 mL/min (ref >90)
Glucose, Bld: 113 mg/dL — ABNORMAL HIGH (ref 70–99)
Potassium: 3.7 mEq/L (ref 3.5–5.1)
Total Bilirubin: 0.4 mg/dL (ref 0.3–1.2)

## 2011-10-19 LAB — CBC
MCV: 92 fL (ref 78.0–100.0)
Platelets: 303 10*3/uL (ref 150–400)
RDW: 12.9 % (ref 11.5–15.5)
WBC: 6.2 10*3/uL (ref 4.0–10.5)

## 2011-10-19 LAB — HEPARIN LEVEL (UNFRACTIONATED): Heparin Unfractionated: 0.34 IU/mL (ref 0.30–0.70)

## 2011-10-19 MED ORDER — WARFARIN SODIUM 10 MG PO TABS
10.0000 mg | ORAL_TABLET | Freq: Once | ORAL | Status: AC
  Administered 2011-10-19: 10 mg via ORAL
  Filled 2011-10-19: qty 1

## 2011-10-19 NOTE — Progress Notes (Signed)
ANTICOAGULATION CONSULT NOTE - Follow Up Consult  Pharmacy Consult for heparin + coumadin Indication: mural thrombus  Assessment: 45 yom anticoagulated with heparin + coumadin for mural thrombus. INR today remains subtherapeutic with minimal change at 1.14. Heparin level is 0.35, therapeutic on 1350 units/hr.    Goal of Therapy:  INR 2-3 Heparin level 0.3-0.5   Plan:  Continue heparin at 1350 units/hr and recheck level with am labs.    No Known Allergies  Patient Measurements: Height: 5\' 7"  (170.2 cm) Weight: 168 lb 6.9 oz (76.4 kg) IBW/kg (Calculated) : 66.1   Vital Signs: Temp: 99.7 F (37.6 C) (11/26 1404) Temp src: Oral (11/26 1404) BP: 101/66 mmHg (11/26 1404) Pulse Rate: 79  (11/26 1404)  Labs:  Basename 10/19/11 1632 10/19/11 0650 10/18/11 0600 10/17/11 0600  HGB -- 12.7* 12.9* --  HCT -- 36.7* 36.0* 37.5*  PLT -- 303 251 238  APTT -- -- -- --  LABPROT -- 14.8 13.9 13.8  INR -- 1.14 1.05 1.04  HEPARINUNFRC 0.34 <0.10* 0.50 --  CREATININE -- 1.01 1.00 0.99  CKTOTAL -- -- -- --  CKMB -- -- -- --  TROPONINI -- -- -- --   Estimated Creatinine Clearance: 88.2 ml/min (by C-G formula based on Cr of 1.01).   Medications:  Scheduled:     . aspirin EC  81 mg Oral Daily  . clopidogrel  75 mg Oral Q breakfast  . docusate sodium  100 mg Oral BID  . lisinopril  5 mg Oral Daily  . metoprolol tartrate  50 mg Oral BID  . polyethylene glycol  17 g Oral Daily  . warfarin  10 mg Oral ONCE-1800   Infusions:     . sodium chloride 10 mL/hr at 10/19/11 1636  . heparin 1,350 Units/hr (10/19/11 1427)     Rodney Marquez Pharm.D., BCPS 10/19/2011 6:59 PM [phone]   Rodney Marquez 10/19/2011,6:58 PM

## 2011-10-19 NOTE — Progress Notes (Signed)
ANTICOAGULATION CONSULT NOTE - Follow Up Consult  Pharmacy Consult for heparin + coumadin Indication: mural thrombus  No Known Allergies  Patient Measurements: Height: 5\' 7"  (170.2 cm) Weight: 168 lb 6.9 oz (76.4 kg) IBW/kg (Calculated) : 66.1   Vital Signs: Temp: 98.1 F (36.7 C) (11/26 0552) Temp src: Oral (11/26 0552) BP: 107/71 mmHg (11/26 0552) Pulse Rate: 66  (11/26 0552)  Labs:  Basename 10/19/11 0650 10/18/11 0600 10/18/11 0015 10/17/11 0600  HGB 12.7* 12.9* -- --  HCT 36.7* 36.0* -- 37.5*  PLT 303 251 -- 238  APTT -- -- -- --  LABPROT 14.8 13.9 -- 13.8  INR 1.14 1.05 -- 1.04  HEPARINUNFRC <0.10* 0.50 0.46 --  CREATININE 1.01 1.00 -- 0.99  CKTOTAL -- -- -- --  CKMB -- -- -- --  TROPONINI -- -- -- --   Estimated Creatinine Clearance: 88.2 ml/min (by C-G formula based on Cr of 1.01).   Medications:  Scheduled:    . aspirin EC  81 mg Oral Daily  . clopidogrel  75 mg Oral Q breakfast  . docusate sodium  100 mg Oral BID  . lisinopril  5 mg Oral Daily  . metoprolol tartrate  50 mg Oral BID  . polyethylene glycol  17 g Oral Daily  . warfarin  10 mg Oral ONCE-1800  . warfarin  7.5 mg Oral ONCE-1800   Infusions:    . sodium chloride 10 mL/hr at 10/13/11 0900  . heparin 13.5 mL/hr (10/18/11 0700)    Assessment: 45 yom anticoagulated with heparin + coumadin for mural thrombus. INR today remains subtherapeutic with minimal change at 1.14. Heparin level is undetectable. Heparin was off for an unknown amount of time, possibly since procedure yesterday. Previously therapeutic on 1350units/hr.  Goal of Therapy:  INR 2-3 Heparin level 0.3-0.5   Plan:  Heparin resumed this AM at 1350units/hr, will check a 6 hour heparin level Coumadin 10mg  PO x 1 F/u AM INR  Malee Grays, Drake Leach 10/19/2011,9:30 AM

## 2011-10-19 NOTE — Progress Notes (Signed)
Subjective:  Doing well.  No chest pain.  No cough at present.   Objective:  Vital Signs in the last 24 hours: Temp:  [98.1 F (36.7 C)-99.1 F (37.3 C)] 98.1 F (36.7 C) (11/26 0552) Pulse Rate:  [66-72] 66  (11/26 0552) Resp:  [18] 18  (11/26 0552) BP: (107-131)/(71-82) 107/71 mmHg (11/26 0552) SpO2:  [98 %] 98 % (11/26 0552)  Intake/Output from previous day: 11/25 0701 - 11/26 0700 In: 240 [P.O.:240] Out: 1100 [Urine:1100]   Physical Exam: General: Well developed, well nourished, in no acute distress. Head:  Normocephalic and atraumatic. Lungs: Clear to auscultation and percussion.  Minimal if any decrease BS left base. Heart: Normal S1 and S2.  No murmur, rubs or gallops. Abdomen:  No abdominal or flank tenderness.   Pulses: Pulses normal in all 4 extremities. Extremities: No clubbing or cyanosis. No edema. Neurologic: Alert and oriented x 3.    Lab Results:  Basename 10/18/11 0600 10/17/11 0600  WBC 7.0 6.1  HGB 12.9* 13.6  PLT 251 238    Basename 10/18/11 0600 10/17/11 0600  NA 135 136  K 4.0 4.0  CL 101 100  CO2 28 28  GLUCOSE 101* 103*  BUN 12 14  CREATININE 1.00 0.99   No results found for this basename: TROPONINI:2,CK,MB:2 in the last 72 hours Hepatic Function Panel  Basename 10/18/11 0600  PROT 6.7  ALBUMIN 3.0*  AST 90*  ALT 170*  ALKPHOS 86  BILITOT 0.4  BILIDIR --  IBILI --   No results found for this basename: CHOL in the last 72 hours No results found for this basename: PROTIME in the last 72 hours  Imaging: Dg Chest 2 View  10/18/2011  *RADIOLOGY REPORT*  Clinical Data: Fever, hypertension  CHEST - 2 VIEW  Comparison:   10/16/2011  Findings:  Partial interval improvement in the left lower lobe posterior and medial basal segment atelectasis/consolidation. Right lung clear.  No effusion.  Heart size normal.  Regional bones unremarkable.  IMPRESSION:  1.  Partial improvement in left lower lobe airspace consolidation.  Original Report  Authenticated By: Osa Craver, M.D.   US Abdomen Complete  10/18/2011  *RADIOLOGY REPORT*  Clinical Data:  Abnormal liver function tests.  ABDOMEN ULTRASOUND  Technique:  Complete abdominal ultrasound examination was performed including evaluation of the liver, gallbladder, bile ducts, pancreas, kidneys, spleen, IVC, and abdominal aorta.  Comparison:  None  Findings:  Gallbladder:  No shadowing gallstones or echogenic sludge.  No gallbladder wall thickening or pericholecystic fluid.  Negative sonographic Murphy's sign according to the ultrasound technologist.  Common Bile Duct:  Normal caliber of 3 mm.  Liver:  The liver shows normal echotexture without focal mass or biliary ductal dilatation.  The medial left lobe of the liver was difficult to completely visualize due to overlying bowel gas.  IVC:  Patent throughout its visualized course in the abdomen.  Pancreas:  The pancreas is completely obscured by bowel gas.  Spleen:  The spleen is of normal echotexture and size.  Kidneys:  The right kidney measures 10.4 cm and the left kidney 11.2 cm.  Both have a normal sonographic appearance without focal lesion or hydronephrosis.  Abdominal Aorta:  Only the proximal abdominal aorta is visible by ultrasound and is of normal caliber.  Small left pleural effusion identified.  IMPRESSION: Unremarkable abdominal ultrasound study.  Lack of complete visualization of the left lobe of the liver and the entire pancreas due to overlying bowel gas.  Original Report Authenticated By: Reola Calkins, M.D.        Assessment/Plan:  Patient Active Hospital Problem List: ST elevation myocardial infarction (STEMI) of anterior wall (10/14/2011)   Assessment: Stable at present.    Plan: Home when INR gets close to 2.0 Hypertension (10/14/2011)   Assessment: Controlled   Plan: continue meds HLD (hyperlipidemia)/transaminase elevation   Assessment: Statins on hold.  Abdominal US performed and without obvious  anatomical findings.    Plan: Continue to watch.  Labs pending. Fever (10/14/2011)   Assessment: Minimal if any   Plan: Left base atelectasis is improved on most recent CXR. Constipation (10/14/2011)   Assessment: Resolved         Shawnie Pons, MD, [facility], [facility] 10/19/2011, 8:02 AM

## 2011-10-20 DIAGNOSIS — I236 Thrombosis of atrium, auricular appendage, and ventricle as current complications following acute myocardial infarction: Secondary | ICD-10-CM | POA: Diagnosis not present

## 2011-10-20 LAB — PROTIME-INR: Prothrombin Time: 18 seconds — ABNORMAL HIGH (ref 11.6–15.2)

## 2011-10-20 LAB — HEPARIN LEVEL (UNFRACTIONATED)
Heparin Unfractionated: 0.77 IU/mL — ABNORMAL HIGH (ref 0.30–0.70)
Heparin Unfractionated: 0.54 IU/mL (ref 0.30–0.70)
Heparin Unfractionated: 0.3 IU/mL (ref 0.30–0.70)

## 2011-10-20 LAB — CBC
MCV: 91.9 fL (ref 78.0–100.0)
Platelets: 334 10*3/uL (ref 150–400)
RBC: 3.69 MIL/uL — ABNORMAL LOW (ref 4.22–5.81)
WBC: 6.1 10*3/uL (ref 4.0–10.5)

## 2011-10-20 LAB — CULTURE, BLOOD (ROUTINE X 2): Culture  Setup Time: 201211211818

## 2011-10-20 LAB — COMPREHENSIVE METABOLIC PANEL
Albumin: 3 g/dL — ABNORMAL LOW (ref 3.5–5.2)
BUN: 9 mg/dL (ref 6–23)
Chloride: 102 mEq/L (ref 96–112)
Creatinine, Ser: 1.06 mg/dL (ref 0.50–1.35)
GFR calc Af Amer: >90 mL/min (ref >90)
Total Bilirubin: 0.3 mg/dL (ref 0.3–1.2)

## 2011-10-20 MED ORDER — WARFARIN SODIUM 10 MG PO TABS
10.0000 mg | ORAL_TABLET | Freq: Once | ORAL | Status: AC
  Administered 2011-10-20: 10 mg via ORAL
  Filled 2011-10-20: qty 1

## 2011-10-20 NOTE — Progress Notes (Signed)
ANTICOAGULATION CONSULT NOTE - Follow Up Consult  Pharmacy Consult for Heparin/Warfarin Indication: LV mural thrombus  No Known Allergies  Patient Measurements: Height: 5\' 7"  (170.2 cm) Weight: 168 lb 6.9 oz (76.4 kg) IBW/kg (Calculated) : 66.1  Heparin Dosing Weight: 76.4 kg  Vital Signs: Temp: 98.5 F (36.9 C) (11/27 1347) Temp src: Oral (11/27 1347) BP: 107/71 mmHg (11/27 1347) Pulse Rate: 65  (11/27 1347)  Labs:  Basename 10/20/11 1350 10/20/11 0547 10/19/11 1632 10/19/11 0650 10/18/11 0600  HGB -- 11.4* -- 12.7* --  HCT -- 33.9* -- 36.7* 36.0*  PLT -- 334 -- 303 251  APTT -- -- -- -- --  LABPROT -- 18.0* -- 14.8 13.9  INR -- 1.46 -- 1.14 1.05  HEPARINUNFRC 0.54 0.77* 0.34 -- --  CREATININE -- 1.06 -- 1.01 1.00  CKTOTAL -- -- -- -- --  CKMB -- -- -- -- --  TROPONINI -- -- -- -- --   Estimated Creatinine Clearance: 84 ml/min (by C-G formula based on Cr of 1.06).  Warfarin History: 11/24: Warfarin 7.5 mg (INR 1.04 >> 1.05) 11/25: Warfarin 7.5 mg (INR 1.05 >> 1.14) 11/26: Warfarin 10 mg (INR 1.14 >> 1.46)  Assessment: 45 y.o. F on heparin/warfarin for LV mural thrombus with a slightly SUPRAtherapeutic heparin level this afternoon (HL 0.54, goal of 0.3-0.5) and SUBtherapeutic INR this a.m. (INR 1.46). No s/sx of bleeding noted. Discussed with cardiologist, will attempt to get within 0.3-0.5 range to avoid any bleeding complications.  Goal of Therapy:  INR 2-3, HL 0.3-0.5   Plan:  1. Decrease heparin drip rate to 1050 units/hr (10.5 ml/hr) 2. Warfarin 10 mg x 1 dose at 1800 today 3. Will continue to monitor for any signs/symptoms of bleeding 4. Will recheck heparin level in 6 hours to ensure within appropriate range 5. Will f/u INR in the a.m.  Rolley Sims 10/20/2011,3:05 PM

## 2011-10-20 NOTE — Progress Notes (Signed)
Subjective:  Doing really well.  Ready to go home  Objective:  Vital Signs in the last 24 hours: Temp:  [98 F (36.7 C)-99.7 F (37.6 C)] 98 F (36.7 C) (11/27 0423) Pulse Rate:  [74-79] 74  (11/27 0423) Resp:  [18-20] 18  (11/27 0423) BP: (101-111)/(66-69) 105/66 mmHg (11/27 0423) SpO2:  [97 %-99 %] 97 % (11/27 0423)  Intake/Output from previous day: 11/26 0701 - 11/27 0700 In: 360 [P.O.:360] Out: 1275 [Urine:1275]   Physical Exam: General: Well developed, well nourished, in no acute distress. Head:  Normocephalic and atraumatic. Lungs: Clear to auscultation and percussion. Heart: Normal S1 and S2.  No murmur, rubs or gallops. Groin:  OK.  No hematoma  Pulses: Pulses normal in all 4 extremities. Extremities: No clubbing or cyanosis. No edema. Neurologic: Alert and oriented x 3.    Lab Results:  Basename 10/20/11 0547 10/19/11 0650  WBC 6.1 6.2  HGB 11.4* 12.7*  PLT 334 303    Basename 10/20/11 0547 10/19/11 0650  NA 136 137  K 4.1 3.7  CL 102 103  CO2 25 28  GLUCOSE 104* 113*  BUN 9 12  CREATININE 1.06 1.01   No results found for this basename: TROPONINI:2,CK,MB:2 in the last 72 hours Hepatic Function Panel  Basename 10/20/11 0547  PROT 6.7  ALBUMIN 3.0*  AST 58*  ALT 152*  ALKPHOS 84  BILITOT 0.3  BILIDIR --  IBILI --   No results found for this basename: CHOL in the last 72 hours No results found for this basename: PROTIME in the last 72 hours  Imaging: Dg Chest 2 View  10/18/2011  *RADIOLOGY REPORT*  Clinical Data: Fever, hypertension  CHEST - 2 VIEW  Comparison:   10/16/2011  Findings:  Partial interval improvement in the left lower lobe posterior and medial basal segment atelectasis/consolidation. Right lung clear.  No effusion.  Heart size normal.  Regional bones unremarkable.  IMPRESSION:  1.  Partial improvement in left lower lobe airspace consolidation.  Original Report Authenticated By: Osa Craver, M.D.   US Abdomen  Complete  10/18/2011  *RADIOLOGY REPORT*  Clinical Data:  Abnormal liver function tests.  ABDOMEN ULTRASOUND  Technique:  Complete abdominal ultrasound examination was performed including evaluation of the liver, gallbladder, bile ducts, pancreas, kidneys, spleen, IVC, and abdominal aorta.  Comparison:  None  Findings:  Gallbladder:  No shadowing gallstones or echogenic sludge.  No gallbladder wall thickening or pericholecystic fluid.  Negative sonographic Murphy's sign according to the ultrasound technologist.  Common Bile Duct:  Normal caliber of 3 mm.  Liver:  The liver shows normal echotexture without focal mass or biliary ductal dilatation.  The medial left lobe of the liver was difficult to completely visualize due to overlying bowel gas.  IVC:  Patent throughout its visualized course in the abdomen.  Pancreas:  The pancreas is completely obscured by bowel gas.  Spleen:  The spleen is of normal echotexture and size.  Kidneys:  The right kidney measures 10.4 cm and the left kidney 11.2 cm.  Both have a normal sonographic appearance without focal lesion or hydronephrosis.  Abdominal Aorta:  Only the proximal abdominal aorta is visible by ultrasound and is of normal caliber.  Small left pleural effusion identified.  IMPRESSION: Unremarkable abdominal ultrasound study.  Lack of complete visualization of the left lobe of the liver and the entire pancreas due to overlying bowel gas.  Original Report Authenticated By: Reola Calkins, M.D.  Assessment/Plan:  Patient Active Hospital Problem List: ST elevation myocardial infarction (STEMI) of anterior wall (10/14/2011)   Assessment: Doing well.  No chest pain.  Ambulating   Plan: Probably home tomorrow HLD (hyperlipidemia) ()   Assessment: LFTs improving   Plan: Hold statins for now.  Consider low dose trial as OP Fever (10/14/2011)   Assessment: Borderline   Plan: Continue encouragement to deep breath Constipation (10/14/2011)    Assessment: Resolved LV mural thrombus (10/18/2011)   Evident by repeat 2D echo on second study   Continue heparin until am with reduced dose  (level high)--discussed with pharmacy who will recheck at 2pm   Continue plavix.  DC ASA tomorrow, and continue plavix and warfarin  (WOEST)  Shawnie Pons, MD, [facility], [facility] 10/20/2011 9:42 AM        Shawnie Pons, MD, [facility], FSCAI 10/20/2011, 9:36 AM

## 2011-10-20 NOTE — Progress Notes (Signed)
Utilization review completed. Travonte Byard, RN, BSN.10/20/11  

## 2011-10-20 NOTE — Progress Notes (Signed)
ANTICOAGULATION CONSULT NOTE - Follow Up Consult  Pharmacy Consult for heparin + coumadin Indication: mural thrombus  Assessment: 45 yom anticoagulated with heparin + coumadin for mural thrombus. INR today remains subtherapeutic with minimal change at 1.46. Heparin level is 0.77 on 1350 units/hr. --appears to have reached saturation point overnight   Goal of Therapy:  INR 2-3 Heparin level 0.3-0.5   Plan:  Decrease heparin to 1100 units/hr and recheck level  6 hours from decrease   No Known Allergies  Patient Measurements: Height: 5\' 7"  (170.2 cm) Weight: 168 lb 6.9 oz (76.4 kg) IBW/kg (Calculated) : 66.1   Vital Signs: Temp: 98 F (36.7 C) (11/27 0423) Temp src: Oral (11/27 0423) BP: 105/66 mmHg (11/27 0423) Pulse Rate: 74  (11/27 0423)  Labs:  Basename 10/20/11 0547 10/19/11 1632 10/19/11 0650 10/18/11 0600  HGB 11.4* -- 12.7* --  HCT 33.9* -- 36.7* 36.0*  PLT 334 -- 303 251  APTT -- -- -- --  LABPROT 18.0* -- 14.8 13.9  INR 1.46 -- 1.14 1.05  HEPARINUNFRC 0.77* 0.34 <0.10* --  CREATININE -- -- 1.01 1.00  CKTOTAL -- -- -- --  CKMB -- -- -- --  TROPONINI -- -- -- --   Estimated Creatinine Clearance: 88.2 ml/min (by C-G formula based on Cr of 1.01).   Medications:  Scheduled:     . aspirin EC  81 mg Oral Daily  . clopidogrel  75 mg Oral Q breakfast  . docusate sodium  100 mg Oral BID  . lisinopril  5 mg Oral Daily  . metoprolol tartrate  50 mg Oral BID  . polyethylene glycol  17 g Oral Daily  . warfarin  10 mg Oral ONCE-1800   Infusions:     . sodium chloride 10 mL/hr at 10/19/11 1636  . heparin 1,350 Units/hr (10/20/11 0639)      Janice Coffin 10/20/2011,7:20 AM

## 2011-10-21 ENCOUNTER — Telehealth: Payer: Self-pay | Admitting: Physician Assistant

## 2011-10-21 LAB — CBC
HCT: 35.2 % — ABNORMAL LOW (ref 39.0–52.0)
MCV: 91.4 fL (ref 78.0–100.0)
RBC: 3.85 MIL/uL — ABNORMAL LOW (ref 4.22–5.81)
WBC: 6.1 10*3/uL (ref 4.0–10.5)

## 2011-10-21 LAB — PROTIME-INR: Prothrombin Time: 19.9 seconds — ABNORMAL HIGH (ref 11.6–15.2)

## 2011-10-21 LAB — COMPREHENSIVE METABOLIC PANEL
Albumin: 3 g/dL — ABNORMAL LOW (ref 3.5–5.2)
BUN: 8 mg/dL (ref 6–23)
Calcium: 8.7 mg/dL (ref 8.4–10.5)
Creatinine, Ser: 1.06 mg/dL (ref 0.50–1.35)
GFR calc Af Amer: >90 mL/min (ref >90)
Total Protein: 6.7 g/dL (ref 6.0–8.3)

## 2011-10-21 MED ORDER — NITROGLYCERIN 0.4 MG SL SUBL: 0.4000 mg | SUBLINGUAL_TABLET | SUBLINGUAL | Status: DC | PRN

## 2011-10-21 MED ORDER — WARFARIN SODIUM 10 MG PO TABS
12.5000 mg | ORAL_TABLET | Freq: Once | ORAL | Status: DC
  Filled 2011-10-21: qty 1

## 2011-10-21 MED ORDER — LISINOPRIL 5 MG PO TABS: 5.0000 mg | ORAL_TABLET | Freq: Every day | ORAL | Status: DC

## 2011-10-21 MED ORDER — METOPROLOL TARTRATE 50 MG PO TABS: 50.0000 mg | ORAL_TABLET | Freq: Two times a day (BID) | ORAL | Status: DC

## 2011-10-21 MED ORDER — ASPIRIN 81 MG PO TBEC: 81.0000 mg | DELAYED_RELEASE_TABLET | Freq: Every day | ORAL | Status: AC

## 2011-10-21 MED ORDER — WARFARIN SODIUM 5 MG PO TABS: 5.0000 mg | ORAL_TABLET | ORAL | Status: DC

## 2011-10-21 MED ORDER — CLOPIDOGREL BISULFATE 75 MG PO TABS: 75.0000 mg | ORAL_TABLET | Freq: Every day | ORAL | Status: DC

## 2011-10-21 NOTE — Progress Notes (Signed)
ANTICOAGULATION CONSULT NOTE - Follow Up Consult  Pharmacy Consult for Heparin Indication: LV mural thrombus  No Known Allergies  Patient Measurements: Height: 5\' 7"  (170.2 cm) Weight: 168 lb 6.9 oz (76.4 kg) IBW/kg (Calculated) : 66.1   Vital Signs: Temp: 98.9 F (37.2 C) (11/27 2116) Temp src: Oral (11/27 2116) BP: 101/64 mmHg (11/27 2116) Pulse Rate: 71  (11/27 2116)  Labs:  Basename 10/20/11 2129 10/20/11 1350 10/20/11 0547 10/19/11 0650 10/18/11 0600  HGB -- -- 11.4* 12.7* --  HCT -- -- 33.9* 36.7* 36.0*  PLT -- -- 334 303 251  APTT -- -- -- -- --  LABPROT -- -- 18.0* 14.8 13.9  INR -- -- 1.46 1.14 1.05  HEPARINUNFRC 0.30 0.54 0.77* -- --  CREATININE -- -- 1.06 1.01 1.00  CKTOTAL -- -- -- -- --  CKMB -- -- -- -- --  TROPONINI -- -- -- -- --   Estimated Creatinine Clearance: 84 ml/min (by C-G formula based on Cr of 1.06).  Assessment: 45yo male now on low end of therapeutic goal on heparin with minor rate decrease.  Goal of Therapy:  Heparin level 0.3-0.5   Plan:  Will continue heparin at current rate and confirm stable with am labs.  Colleen Can PharmD BCPS 10/21/2011,1:11 AM

## 2011-10-21 NOTE — Progress Notes (Addendum)
Subjective:  Doing well.  No complaints.  Feels good.   Objective:  Vital Signs in the last 24 hours: Temp:  [98.1 F (36.7 C)-98.9 F (37.2 C)] 98.1 F (36.7 C) (11/28 0537) Pulse Rate:  [65-81] 68  (11/28 0537) Resp:  [16-20] 18  (11/28 0537) BP: (95-130)/(64-80) 95/64 mmHg (11/28 0537) SpO2:  [92 %-99 %] 98 % (11/28 0537) Weight:  [72.1 kg (158 lb 15.2 oz)] 158 lb 15.2 oz (72.1 kg) (11/28 0529)  Intake/Output from previous day: 11/27 0701 - 11/28 0700 In: 1479 [P.O.:1080; I.V.:399] Out: 1 [Stool:1]   Physical Exam: General: Well developed, well nourished, in no acute distress. Head:  Normocephalic and atraumatic. Lungs: Clear to auscultation and percussion. Heart: Normal S1 and S2.  No murmur, rubs or gallops.  Pulses: Pulses normal in all 4 extremities. Extremities: No clubbing or cyanosis. No edema. Neurologic: Alert and oriented x 3.    Lab Results:  Basename 10/21/11 0553 10/20/11 0547  WBC 6.1 6.1  HGB 12.5* 11.4*  PLT 380 334    Basename 10/21/11 0553 10/20/11 0547  NA 136 136  K 4.0 4.1  CL 102 102  CO2 25 25  GLUCOSE 113* 104*  BUN 8 9  CREATININE 1.06 1.06   No results found for this basename: TROPONINI:2,CK,MB:2 in the last 72 hours Hepatic Function Panel  Basename 10/21/11 0553  PROT 6.7  ALBUMIN 3.0*  AST 49*  ALT 137*  ALKPHOS 83  BILITOT 0.2*  BILIDIR --  IBILI --   No results found for this basename: CHOL in the last 72 hours No results found for this basename: PROTIME in the last 72 hours  Imaging: No results found.    Cardiac Studies:  Telemetry reviewed:  No arrhythmias  Assessment/Plan:  Patient Active Hospital Problem List: ST elevation myocardial infarction (STEMI) of anterior wall (10/14/2011)   Assessment: [Doing extremely well.  No chest pain.  Ambulating. ECG is consistent with completed infarct, persistent STE, all consistent with late presentation   Plan: Home today.  Continue ASA for three more days, then dc.   He should be on warfarin and plavix only.  Coumadin clinic visit on Friday of this week.  Reduce dose to 5 mg daily.  Goal INR of 2-2.5 Hypertension (10/14/2011)   Assessment: Controlled   Plan: Continue beta blockers HLD (hyperlipidemia) ()   Assessment: LFTs are better.    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  Will dc heparin later on today and send home.  Do not want to continue beyond today because of the risk of bleeding going up since he is on DAPT as well.  Coumadin clinic on Friday.  Warfarin 5 mg per day to start.     Plan: See above  Total discharge time greater than 30 minutes combined      Shawnie Pons, MD, [facility], [facility] 10/21/2011, 9:56 AM

## 2011-10-21 NOTE — Progress Notes (Signed)
ANTICOAGULATION CONSULT NOTE - Follow Up Consult  Pharmacy Consult for Heparin/Warfarin Indication: LV mural thrombus  No Known Allergies  Patient Measurements: Height: 5\' 7"  (170.2 cm) Weight: 158 lb 15.2 oz (72.1 kg) IBW/kg (Calculated) : 66.1  Heparin Dosing Weight: 76.4 kg  Vital Signs: Temp: 98.1 F (36.7 C) (11/28 0537) Temp src: Oral (11/28 0537) BP: 95/64 mmHg (11/28 0537) Pulse Rate: 68  (11/28 0537)  Labs:  Basename 10/21/11 0553 10/20/11 2129 10/20/11 1350 10/20/11 0547 10/19/11 0650  HGB 12.5* -- -- 11.4* --  HCT 35.2* -- -- 33.9* 36.7*  PLT 380 -- -- 334 303  APTT -- -- -- -- --  LABPROT 19.9* -- -- 18.0* 14.8  INR 1.66* -- -- 1.46 1.14  HEPARINUNFRC 0.32 0.30 0.54 -- --  CREATININE 1.06 -- -- 1.06 1.01  CKTOTAL -- -- -- -- --  CKMB -- -- -- -- --  TROPONINI -- -- -- -- --   Estimated Creatinine Clearance: 84 ml/min (by C-G formula based on Cr of 1.06).  Warfarin History: 11/24: Warfarin 7.5 mg (INR 1.04 >> 1.05) 11/25: Warfarin 7.5 mg (INR 1.05 >> 1.14) 11/26: Warfarin 10 mg (INR 1.14 >> 1.46) 11/27: Warfarin 10 mg (INR 1.46 >> 1.66)  Assessment: 45 y.o. M on heparin/warfarin for LV mural thrombus with a slightly SUPRAtherapeutic heparin level this afternoon (HL 0.52, goal of 0.3-0.5) and SUBtherapeutic INR this a.m. (INR 1.66) however trending up. No s/sx of bleeding noted.   Goal of Therapy:  INR 2-3, HL 0.3-0.5   Plan:  1. Decrease heparin drip rate to 1000 units/hr (10 ml/hr) 2. Warfarin 12.5 mg x 1 dose at 1800 today 3. Will continue to monitor for any signs/symptoms of bleeding 4. Will f/u INR and heparin level in the a.m.  Georgina Pillion Ann 10/21/2011,9:11 AM

## 2011-10-21 NOTE — Discharge Summary (Signed)
Discharge Summary   Marquez ID: Rodney Marquez MRN: [id number], DOB/AGE: Nov 30, 1965 45 y.o. Admit date: 10/12/2011 D/C date:     10/21/2011   Primary Discharge Diagnoses:  1. Late-presenting anterior STEMI with newly diagnosed CAD 10/12/11 - s/p BMS to totally occluded LAD 2. ICM EF 40-45% by echo 10/16/11 along with moderate diastolic dysfunction 3. LV thrombus noted on echo 10/16/11 - started on Coumadin, goal INR 2-2.5 4. Elevated LFT's - not on statin due to this 5. HTN 6. HL  Hospital Course: 45 y/o M with no prior hx of CAD but a hx of HTN and HL presented to [facility] with complaints of chest pain since Rodney day before, on/off, described as a pressure across his chest without associated nausea, vomiting, diaphoresis or SOB. He had however felt more fatigued than usual. Rodney pain progressively got worse today so he took 6 full-dose aspirin at home but didn't feel better so came to [facility] ER. There, EKG was identified as an acute STEMI showing anterior and inferior ST elevation. He was taken emergently to Rodney cath lab, which demonstrated a totally occluded LAD just beyond Rodney origin of Rodney second diagonal branch. At Rodney site of total occlusion, Rodney vessel was dissected with TIMI 0 flow. Following wiring, and balloon dilatation, Rodney vessel was opened demonstrating an infarction related dissection. Despite balloon dilatation, there was very poor distal runoff. BMS was placed to this vessel and post dilated; following passage of Rodney balloon distally, there was TIMI 2 flow into a small caliber apical vessel - this was most consistent with an edematous apex due to delayed presentation. LV gram was not performed secondary to delayed presentation done in anticipation of an LV mural thrombus. He was started on Effient, BB, ASA, and statin.  Initial 2D echo 10/12/11 was performed demonstrating numerous WMA & EF of 40%, without mention of thrombus. Low-dose lisinopril was started He continued to have intermittent  chest pain post-cath and Lopressor was increased. Coumadin was added given high risk for apical aneurysm/thrombus formation. Post-procedurally over Rodney next several days, he mounted a fever of uncertain etiology. TMax on Rodney first day it was noted was 102.5. CXR/UA were without clear sources for infection. Blood cx remained negative. WBC initially rose to a peak of 11.8, but then dropped back down to normal. He did not appear toxic. Dr. Riley Kill felt perhaps his fever was related to his completed anterior infarct. LFTs were noted to rise slightly (although were elevated on admission), with unremarkable abdominal ultrasound. Statin was thus held.  Follow-up 2D echo on 10/16/11 did in fact demonstrate LV apical thrombus, along with continued LV dysfunction with EF 40-45% and therefore Rodney plan was to switch prasugrel to Plavix x 4 weeks, start IV UFH in combo with warfarin, and continue ASA short-term with plan for 3 months of Coumadin with restudy recommended at that time. At d/c, his ASA will only be continued 3 more days. Today, Rodney Marquez is feeling well. His INR is 1.66, and given DAPT, Dr. Riley Kill does not want to continue beyond today because of Rodney risk of bleeding. His heparin was discontinued at 2pm today and he will go home on Coumadin 5mg  daily to start per MD. Dr. Riley Kill has seen & examined him today and feels he is stable for discharge.  Discharge Vitals: Blood pressure 107/70, pulse 75, temperature 99.8 F (37.7 C), temperature source Oral, resp. rate 18, height 5\' 7"  (1.702 m), weight 158 lb 15.2 oz (72.1 kg), SpO2 98.00%.  Labs: Lab Results  Component Value Date   WBC 6.1 10/21/2011   HGB 12.5* 10/21/2011   HCT 35.2* 10/21/2011   MCV 91.4 10/21/2011   PLT 380 10/21/2011    Lab 10/21/11 0553  NA 136  K 4.0  CL 102  CO2 25  BUN 8  CREATININE 1.06  CALCIUM 8.7  PROT 6.7  BILITOT 0.2*  ALKPHOS 83  ALT 137*  AST 49*  GLUCOSE 113*    Lab Results  Component Value Date    CHOL 172 10/13/2011   HDL 70 10/13/2011   LDLCALC 90 10/13/2011   TRIG 61 10/13/2011     Diagnostic Studies/Procedures:  1. Chest 2 View 10/18/2011 IMPRESSION:  1.  Partial improvement in left lower lobe airspace consolidation.    2. US Abdomen Complete 10/18/2011 IMPRESSION: Unremarkable abdominal ultrasound study.  Lack of complete visualization of Rodney left lobe of Rodney liver and Rodney entire pancreas due to overlying bowel gas.    3. Cardiac catheterization this admission, please see full report and above for summary.  4. 2D echo 10/12/2011 - Left ventricle: Akinesis of Rodney apical cap. Severe hypokinesis of Rodney apical anterior, apical lateral, apical septal segments. Hypokinesis of Rodney apical inferior segment. Rodney EF is 40% at this time with vigorous motion in Rodney non-affected segments. Doppler parameters are consistent with abnormal left ventricular relaxation (grade 1 diastolic dysfunction).   5. 2D echo 10/16/11 - - Left ventricle: Rodney cavity size was normal. Wall thickness was increased in a pattern of mild LVH. Systolic function was mildly to moderately reduced. Rodney estimated ejection fraction was in Rodney range of 40% to 45%. Rodney periapical segments were akinetic. 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 Features are consistent with a pseudonormal left ventricular filling pattern, with concomitant abnormal relaxation and increased filling pressure (grade 2 diastolic dysfunction). - Aortic valve: There was no stenosis. - Mitral valve: Trivial regurgitation. - Right ventricle: Rodney cavity size was normal. Systolic function was normal. - Pulmonary arteries: PA peak pressure: 29mm Hg (S). - Inferior vena cava: Rodney vessel was normal in size; Rodney respirophasic diameter changes were in Rodney normal range (=50%); findings are consistent with normal central venous pressure.  Discharge Medications   Current Discharge Medication List    START taking these medications   Details  aspirin EC 81 MG EC tablet Take  1 tablet (81 mg total) by mouth daily. For 3 more days. Stop taking after 10/24/11.    clopidogrel (PLAVIX) 75 MG tablet Take 1 tablet (75 mg total) by mouth daily with breakfast. Qty: 30 tablet, Refills: 3    lisinopril (PRINIVIL,ZESTRIL) 5 MG tablet Take 1 tablet (5 mg total) by mouth daily. Qty: 30 tablet, Refills: 6    metoprolol (LOPRESSOR) 50 MG tablet Take 1 tablet (50 mg total) by mouth 2 (two) times daily. Qty: 60 tablet, Refills: 6    nitroGLYCERIN (NITROSTAT) 0.4 MG SL tablet Place 1 tablet (0.4 mg total) under Rodney tongue every 5 (five) minutes as needed for chest pain (up to 3 doses. If you have to take Rodney 3rd dose, call 911). Qty: 25 tablet, Refills: 4    warfarin (COUMADIN) 5 MG tablet Take 1 tablet (5 mg total) by mouth as directed. Continue 1 tablet daily for now. You will take 1 tablet tomorrow 10/22/11, then have your INR checked on 10/23/11 to see if your dose needs adjusting. Qty: 30 tablet, Refills: 3      STOP taking these medications  valsartan-hydrochlorothiazide (DIOVAN-HCT) 160-12.5 MG per tablet         Disposition   Rodney Marquez will be discharged in stable condition to home. Discharge Orders    Future Appointments: Provider: Department: Dept Phone: Center:   10/23/2011 11:30 AM Raul Del, RN [facility] [phone] None   11/04/2011 10:30 AM Beatrice Lecher, PA [facility] [phone] [facility]     Future Orders Please Complete By Expires   Diet - low sodium heart healthy      Increase activity slowly      Comments:   No driving for 2 weeks. No heavy lifting over 5 lbs for 4 weeks. No sexual activity for 4 weeks. You may not return to work until cleared by your cardiologist.    Discharge wound care:      Comments:   Keep procedure site clean & dry. If you notice increased pain, swelling, bleeding or pus, call/return!  You may shower, but no soaking baths/hot tubs/pools for 1 week.       Follow-up Information    Follow  up with [facility]. (Coumadin Clinic 10/23/11 at 11:30am)    Contact information:   [address] [place] [address]       Follow up with Tereso Newcomer, PA. (You will see PA Tereso Newcomer at Dr. Rosalyn Charters office on 11/04/11 at 10:30am)    Contact information:   [address] [place] [address] [phone]            Duration of Discharge Encounter: Greater than 30 minutes including physician and PA time.  Signed, Ronie Spies PA-C 10/21/2011, 3:19 PM  Addendum: please see telephone note 10/21/11 at 6:07 pm regarding NTG/Viagra: Nurse on 2000 alerted me that she had gotten a call from Rodney Marquez outpatient pharmacy regarding filling NTG SL rx after discharge. Rodney Marquez did not disclose to [place] that he had previously had filled an rx for Viagra. I advised Rodney nurse to have Marquez NOT take NTG if he has taken any Viagra in Rodney past 24 hours (and vice versa - I accidentally left that out of phone note but did communicate it to pharmacy). They verbalized understanding and will communicate this to Rodney Marquez.   Nysir Fergusson PA-C 10/21/2011, 6:58 PM

## 2011-10-21 NOTE — Telephone Encounter (Signed)
Nurse on 2000 alerted me that she had gotten a call from Mr. Rodney Marquez outpatient pharmacy regarding filling NTG SL rx after discharge. The patient did not disclose to [place] that he had previously had filled an rx for Viagra. I advised the nurse to have patient NOT take NTG if he has taken any Viagra in the past 24 hours, and they verbalized understanding and will communicate this to the patient.

## 2011-10-23 ENCOUNTER — Ambulatory Visit (INDEPENDENT_AMBULATORY_CARE_PROVIDER_SITE_OTHER): Payer: BC Managed Care – PPO | Admitting: *Deleted

## 2011-10-23 DIAGNOSIS — I236 Thrombosis of atrium, auricular appendage, and ventricle as current complications following acute myocardial infarction: Secondary | ICD-10-CM

## 2011-10-23 DIAGNOSIS — I238 Other current complications following acute myocardial infarction: Secondary | ICD-10-CM

## 2011-10-23 DIAGNOSIS — Z7901 Long term (current) use of anticoagulants: Secondary | ICD-10-CM

## 2011-10-23 LAB — POCT INR: INR: 2.5

## 2011-10-23 MED ORDER — WARFARIN SODIUM 5 MG PO TABS: ORAL_TABLET | ORAL | Status: DC

## 2011-10-28 ENCOUNTER — Other Ambulatory Visit: Payer: Self-pay

## 2011-10-28 DIAGNOSIS — I2510 Atherosclerotic heart disease of native coronary artery without angina pectoris: Secondary | ICD-10-CM

## 2011-10-30 ENCOUNTER — Other Ambulatory Visit (INDEPENDENT_AMBULATORY_CARE_PROVIDER_SITE_OTHER): Payer: BC Managed Care – PPO | Admitting: *Deleted

## 2011-10-30 ENCOUNTER — Ambulatory Visit (INDEPENDENT_AMBULATORY_CARE_PROVIDER_SITE_OTHER): Payer: BC Managed Care – PPO | Admitting: *Deleted

## 2011-10-30 DIAGNOSIS — I236 Thrombosis of atrium, auricular appendage, and ventricle as current complications following acute myocardial infarction: Secondary | ICD-10-CM

## 2011-10-30 DIAGNOSIS — Z7901 Long term (current) use of anticoagulants: Secondary | ICD-10-CM

## 2011-10-30 DIAGNOSIS — I2510 Atherosclerotic heart disease of native coronary artery without angina pectoris: Secondary | ICD-10-CM

## 2011-10-30 DIAGNOSIS — I238 Other current complications following acute myocardial infarction: Secondary | ICD-10-CM

## 2011-10-30 LAB — BASIC METABOLIC PANEL
CO2: 27 mEq/L (ref 19–32)
Chloride: 104 mEq/L (ref 96–112)
Glucose, Bld: 89 mg/dL (ref 70–99)
Potassium: 3.7 mEq/L (ref 3.5–5.1)
Sodium: 139 mEq/L (ref 135–145)

## 2011-10-30 LAB — POCT INR: INR: 2.1

## 2011-10-30 LAB — HEPATIC FUNCTION PANEL
AST: 15 U/L (ref 0–37)
Albumin: 3.5 g/dL (ref 3.5–5.2)
Alkaline Phosphatase: 70 U/L (ref 39–117)
Bilirubin, Direct: 0 mg/dL (ref 0.0–0.3)
Total Protein: 6.6 g/dL (ref 6.0–8.3)

## 2011-10-30 LAB — CBC WITH DIFFERENTIAL/PLATELET
Basophils Absolute: 0 10*3/uL (ref 0.0–0.1)
Eosinophils Relative: 1.1 % (ref 0.0–5.0)
HCT: 37.6 % — ABNORMAL LOW (ref 39.0–52.0)
Lymphocytes Relative: 31.1 % (ref 12.0–46.0)
Monocytes Relative: 11.4 % (ref 3.0–12.0)
Neutrophils Relative %: 55.8 % (ref 43.0–77.0)
Platelets: 376 10*3/uL (ref 150.0–400.0)
RDW: 13.9 % (ref 11.5–14.6)
WBC: 4.8 10*3/uL (ref 4.5–10.5)

## 2011-10-31 ENCOUNTER — Other Ambulatory Visit: Payer: Self-pay

## 2011-10-31 ENCOUNTER — Encounter (HOSPITAL_COMMUNITY): Payer: Self-pay | Admitting: *Deleted

## 2011-10-31 ENCOUNTER — Observation Stay (HOSPITAL_COMMUNITY)
Admission: EM | Admit: 2011-10-31 | Discharge: 2011-11-01 | Disposition: A | Discharge status: Home / Self Care | Payer: BC Managed Care – PPO | Attending: Cardiology | Admitting: Cardiology

## 2011-10-31 ENCOUNTER — Emergency Department (HOSPITAL_COMMUNITY): Payer: BC Managed Care – PPO

## 2011-10-31 DIAGNOSIS — I10 Essential (primary) hypertension: Secondary | ICD-10-CM | POA: Diagnosis present

## 2011-10-31 DIAGNOSIS — I498 Other specified cardiac arrhythmias: Principal | ICD-10-CM | POA: Insufficient documentation

## 2011-10-31 DIAGNOSIS — R079 Chest pain, unspecified: Secondary | ICD-10-CM | POA: Insufficient documentation

## 2011-10-31 DIAGNOSIS — Z7901 Long term (current) use of anticoagulants: Secondary | ICD-10-CM

## 2011-10-31 DIAGNOSIS — Z9861 Coronary angioplasty status: Secondary | ICD-10-CM | POA: Insufficient documentation

## 2011-10-31 DIAGNOSIS — I236 Thrombosis of atrium, auricular appendage, and ventricle as current complications following acute myocardial infarction: Secondary | ICD-10-CM | POA: Diagnosis present

## 2011-10-31 DIAGNOSIS — E876 Hypokalemia: Secondary | ICD-10-CM | POA: Insufficient documentation

## 2011-10-31 DIAGNOSIS — I252 Old myocardial infarction: Secondary | ICD-10-CM | POA: Insufficient documentation

## 2011-10-31 DIAGNOSIS — I471 Supraventricular tachycardia, unspecified: Secondary | ICD-10-CM | POA: Diagnosis present

## 2011-10-31 DIAGNOSIS — Z23 Encounter for immunization: Secondary | ICD-10-CM | POA: Insufficient documentation

## 2011-10-31 DIAGNOSIS — R748 Abnormal levels of other serum enzymes: Secondary | ICD-10-CM | POA: Insufficient documentation

## 2011-10-31 DIAGNOSIS — I2589 Other forms of chronic ischemic heart disease: Secondary | ICD-10-CM | POA: Insufficient documentation

## 2011-10-31 HISTORY — DX: Atherosclerotic heart disease of native coronary artery without angina pectoris: I25.10

## 2011-10-31 HISTORY — DX: ST elevation (STEMI) myocardial infarction involving other coronary artery of anterior wall: I21.09

## 2011-10-31 HISTORY — DX: Ischemic cardiomyopathy: I25.5

## 2011-10-31 HISTORY — DX: Other specified abnormal findings of blood chemistry: R79.89

## 2011-10-31 HISTORY — DX: Abnormal results of liver function studies: R94.5

## 2011-10-31 HISTORY — DX: Thrombosis of atrium, auricular appendage, and ventricle as current complications following acute myocardial infarction: I23.6

## 2011-10-31 LAB — POCT I-STAT TROPONIN I: Troponin i, poc: 0.42 ng/mL (ref 0.00–0.08)

## 2011-10-31 LAB — CBC
HCT: 39 % (ref 39.0–52.0)
Hemoglobin: 13.8 g/dL (ref 13.0–17.0)
MCH: 32.4 pg (ref 26.0–34.0)
MCHC: 35.4 g/dL (ref 30.0–36.0)
MCHC: 35.6 g/dL (ref 30.0–36.0)
RDW: 13.2 % (ref 11.5–15.5)
RDW: 13.2 % (ref 11.5–15.5)
WBC: 6.5 10*3/uL (ref 4.0–10.5)

## 2011-10-31 LAB — COMPREHENSIVE METABOLIC PANEL
Albumin: 3.6 g/dL (ref 3.5–5.2)
Albumin: 3.2 g/dL — ABNORMAL LOW (ref 3.5–5.2)
Alkaline Phosphatase: 90 U/L (ref 39–117)
BUN: 7 mg/dL (ref 6–23)
BUN: 6 mg/dL (ref 6–23)
Calcium: 9.1 mg/dL (ref 8.4–10.5)
Calcium: 8.7 mg/dL (ref 8.4–10.5)
Chloride: 106 mEq/L (ref 96–112)
Creatinine, Ser: 1.06 mg/dL (ref 0.50–1.35)
GFR calc Af Amer: 88 mL/min — ABNORMAL LOW (ref >90)
GFR calc non Af Amer: 84 mL/min — ABNORMAL LOW (ref >90)
Glucose, Bld: 174 mg/dL — ABNORMAL HIGH (ref 70–99)
Potassium: 3.3 mEq/L — ABNORMAL LOW (ref 3.5–5.1)
Sodium: 137 mEq/L (ref 135–145)
Total Bilirubin: 0.2 mg/dL — ABNORMAL LOW (ref 0.3–1.2)
Total Protein: 7.5 g/dL (ref 6.0–8.3)

## 2011-10-31 LAB — TROPONIN I: Troponin I: <0.3 ng/mL (ref <0.30)

## 2011-10-31 LAB — PROTIME-INR
INR: 1.72 — ABNORMAL HIGH (ref 0.00–1.49)
INR: 1.89 — ABNORMAL HIGH (ref 0.00–1.49)
Prothrombin Time: 20.5 seconds — ABNORMAL HIGH (ref 11.6–15.2)
Prothrombin Time: 22 seconds — ABNORMAL HIGH (ref 11.6–15.2)

## 2011-10-31 LAB — DIFFERENTIAL
Basophils Absolute: 0 10*3/uL (ref 0.0–0.1)
Basophils Relative: 0 % (ref 0–1)
Neutro Abs: 5.1 10*3/uL (ref 1.7–7.7)
Neutrophils Relative %: 77 % (ref 43–77)

## 2011-10-31 LAB — CK TOTAL AND CKMB (NOT AT ARMC)
CK, MB: 1.8 ng/mL (ref 0.3–4.0)
Relative Index: 1.5 (ref 0.0–2.5)
Total CK: 123 U/L (ref 7–232)

## 2011-10-31 LAB — MAGNESIUM: Magnesium: 2.1 mg/dL (ref 1.5–2.5)

## 2011-10-31 LAB — OCCULT BLOOD, POC DEVICE: Fecal Occult Bld: NEGATIVE

## 2011-10-31 LAB — PHOSPHORUS: Phosphorus: 1.8 mg/dL — ABNORMAL LOW (ref 2.3–4.6)

## 2011-10-31 MED ORDER — ACETAMINOPHEN 650 MG RE SUPP: 650.0000 mg | Freq: Four times a day (QID) | RECTAL | Status: DC | PRN

## 2011-10-31 MED ORDER — METOPROLOL TARTRATE 1 MG/ML IV SOLN
INTRAVENOUS | Status: AC
  Filled 2011-10-31: qty 15

## 2011-10-31 MED ORDER — LISINOPRIL 5 MG PO TABS
5.0000 mg | ORAL_TABLET | Freq: Every day | ORAL | Status: DC
  Administered 2011-10-31 – 2011-11-01 (×2): 5 mg via ORAL
  Filled 2011-10-31 (×2): qty 1

## 2011-10-31 MED ORDER — WARFARIN SODIUM 2.5 MG PO TABS
2.5000 mg | ORAL_TABLET | Freq: Once | ORAL | Status: AC
  Administered 2011-10-31: 2.5 mg via ORAL
  Filled 2011-10-31: qty 1

## 2011-10-31 MED ORDER — ADENOSINE 6 MG/2ML IV SOLN
INTRAVENOUS | Status: AC
  Filled 2011-10-31: qty 2

## 2011-10-31 MED ORDER — ACETAMINOPHEN 325 MG PO TABS: 650.0000 mg | ORAL_TABLET | Freq: Four times a day (QID) | ORAL | Status: DC | PRN

## 2011-10-31 MED ORDER — SODIUM CHLORIDE 0.9 % IJ SOLN
3.0000 mL | Freq: Two times a day (BID) | INTRAMUSCULAR | Status: DC
  Administered 2011-10-31: 6 mL via INTRAVENOUS
  Administered 2011-11-01: 3 mL via INTRAVENOUS

## 2011-10-31 MED ORDER — METOPROLOL TARTRATE 50 MG PO TABS
50.0000 mg | ORAL_TABLET | Freq: Two times a day (BID) | ORAL | Status: DC
  Administered 2011-10-31 – 2011-11-01 (×2): 50 mg via ORAL
  Filled 2011-10-31 (×3): qty 1

## 2011-10-31 MED ORDER — SODIUM CHLORIDE 0.9 % IV SOLN: 250.0000 mL | INTRAVENOUS | Status: DC | PRN

## 2011-10-31 MED ORDER — PNEUMOCOCCAL VAC POLYVALENT 25 MCG/0.5ML IJ INJ
0.5000 mL | INJECTION | INTRAMUSCULAR | Status: AC
  Administered 2011-11-01: 0.5 mL via INTRAMUSCULAR
  Filled 2011-10-31: qty 0.5

## 2011-10-31 MED ORDER — ADENOSINE 12 MG/4ML IV SOLN
12.0000 mg | Freq: Once | INTRAVENOUS | Status: AC
  Administered 2011-10-31: 12 mg via INTRAVENOUS

## 2011-10-31 MED ORDER — ALUM & MAG HYDROXIDE-SIMETH 200-200-20 MG/5ML PO SUSP: 30.0000 mL | Freq: Four times a day (QID) | ORAL | Status: DC | PRN

## 2011-10-31 MED ORDER — ADENOSINE 6 MG/2ML IV SOLN
INTRAVENOUS | Status: AC
  Administered 2011-10-31: 6 mg
  Filled 2011-10-31: qty 4

## 2011-10-31 MED ORDER — CLOPIDOGREL BISULFATE 75 MG PO TABS
75.0000 mg | ORAL_TABLET | Freq: Every day | ORAL | Status: DC
  Administered 2011-11-01: 75 mg via ORAL
  Filled 2011-10-31: qty 1

## 2011-10-31 MED ORDER — ASPIRIN 325 MG PO TABS
325.0000 mg | ORAL_TABLET | Freq: Once | ORAL | Status: AC
  Administered 2011-10-31: 325 mg via ORAL
  Filled 2011-10-31: qty 1

## 2011-10-31 MED ORDER — POTASSIUM CHLORIDE CRYS ER 20 MEQ PO TBCR
40.0000 meq | EXTENDED_RELEASE_TABLET | Freq: Once | ORAL | Status: AC
  Administered 2011-10-31: 40 meq via ORAL
  Filled 2011-10-31: qty 2

## 2011-10-31 MED ORDER — SODIUM CHLORIDE 0.9 % IJ SOLN: 3.0000 mL | INTRAMUSCULAR | Status: DC | PRN

## 2011-10-31 MED ORDER — METOPROLOL TARTRATE 1 MG/ML IV SOLN
2.5000 mg | Freq: Once | INTRAVENOUS | Status: AC
  Administered 2011-10-31: 2.5 mg via INTRAVENOUS

## 2011-10-31 NOTE — ED Notes (Signed)
Abnormal labs given to MD 

## 2011-10-31 NOTE — H&P (Addendum)
Admit date: 10/31/2011 Referring Physician Dr. Nino Parsley Primary Cardiologist Dr. Riley Kill Chief complaint/reason for admission:SVT  HPI: This is a 45yo AAM with a history of recent late presenting anterior STEMI and underwent emergent cath showing an occluded LAD and underwent BMS placement.  He was noted to have an ischemic CM with EF 45-45% and an LV mural thrombus and was started on Coumadin.  He was in his usual state of health until today when developed sudden onset of palpitations with a funny feeling in his chest and presented to the ER.  He was found to be in SVT at 170bpm and was given Adenosine 6mg  without any change.  He was given 12mg  Adenosine and converted to NSR.  He currently is asymptomatic.  He denies any chest pain but did have some SOB.    PMH:    Past Medical History  Diagnosis Date  . HTN (hypertension)   . HLD (hyperlipidemia)   . Family history of early CAD     Brother had MI at age 13 yo.  . Coronary artery disease   . ST elevation myocardial infarction (STEMI) of anterior wall 09/2011    s/p BMS to occluded LAD  . Cardiomyopathy, ischemic 09/2011    EF 40-45%   . Diastolic dysfunction   . 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 Elevated LFTs     PSH:    Past Surgical History  Procedure Date  . Cardiac catheterization     ss/p PCI BMS to LAD    ALLERGIES:   Review of patient's allergies indicates no known allergies.  Prior to Admit Meds:   (Not in a hospital admission) Family HX:    Family History  Problem Relation Age of Onset  . Coronary artery disease Brother     Had MI in his 40 yo   Social HX:    History   Social History  . Marital Status: Married    Spouse Name: N/A    Number of Children: N/A  . Years of Education: N/A   Occupational History  . Not on file.   Social History Main Topics  . Smoking status: Never Smoker   . Smokeless tobacco: Not on file  . Alcohol Use: 0.6 oz/week    1 Cans of beer per week  .  Drug Use: No  . Sexually Active: Not on file   Other Topics Concern  . Not on file   Social History Narrative  . No narrative on file     ROS:  All 11 ROS were addressed and are negative except what is stated in the HPI  PHYSICAL EXAM Filed Vitals:   10/31/11 1531  BP: 128/86  Pulse:   Temp:   Resp:    General: Well developed, well nourished, in no acute distress Head: Eyes PERRLA, No xanthomas.   Normal cephalic and atramatic  Lungs:   Clear bilaterally to auscultation and percussion. Heart:   HRRR S1 S2 Pulses are 2+ & equal.            No carotid bruit. No JVD.  No abdominal bruits. No femoral bruits. Abdomen: Bowel sounds are positive, abdomen soft and non-tender without masses Extremities:   No clubbing, cyanosis or edema.  DP +1 Neuro: Alert and oriented X 3. Psych:  Good affect, responds appropriately   Labs:   Lab Results  Component Value Date   WBC 7.7 10/31/2011   HGB 13.8 10/31/2011   HCT 39.0 10/31/2011  MCV 91.5 10/31/2011   PLT 369 10/31/2011    Lab 10/30/11 1357  NA 139  K 3.7  CL 104  CO2 27  BUN 10  CREATININE 1.1  CALCIUM 8.5  PROT 6.6  BILITOT 0.3  ALKPHOS 70  ALT 25  AST 15  GLUCOSE 89   Lab Results  Component Value Date   CKTOTAL PENDING 10/31/2011   CKMB 1.8 10/31/2011   TROPONINI >25.00* 10/12/2011   No results found for this basename: PTT   Lab Results  Component Value Date   INR 1.72* 10/31/2011   INR 2.1 10/30/2011   INR 2.5 10/23/2011     Lab Results  Component Value Date   CHOL 172 10/13/2011   Lab Results  Component Value Date   HDL 70 10/13/2011   Lab Results  Component Value Date   LDLCALC 90 10/13/2011   Lab Results  Component Value Date   TRIG 61 10/13/2011   Lab Results  Component Value Date   CHOLHDL 2.5 10/13/2011   No results found for this basename: LDLDIRECT      Radiology:  @[id number] @  EKG:  SVT at 178bpm with ST elevation in V3-V6(no change from ST elevation on EKG prior to d/c after  STEMI).  EKG after adenosine showed NSR with biphasic T wave changes in the anterior precordial leads and residual ST elevation but improve from discharge EKG  ASSESSMENT:  1.  SVT resolved after adenosine 2.  Recent anterior STEMI with BMS to LAD 3.  Ischemic CM EF 40-45% with apical mural thrombus 4.  Systemic anticoagulation 5.  HTN 6.  Hypokalemia  PLAN:   1.  Admit to 23 hour observation 2.  Cycle cardiac enzymes 3.  Increase Lopressor to 75mg  BID 4.  Continue Coumadin/Plavix 5.  Kdur now 6.  BMET in am  Quintella Reichert, MD  10/31/2011  4:19 PM

## 2011-10-31 NOTE — Progress Notes (Signed)
ANTICOAGULATION CONSULT NOTE - Initial Consult  Pharmacy Consult for coumadin Indication: mural thrombus  No Known Allergies  Patient Measurements:     Vital Signs: Temp: 98 F (36.7 C) (12/08 1833) Temp src: Oral (12/08 1504) BP: 131/84 mmHg (12/08 1833) Pulse Rate: 83  (12/08 1833)  Labs:  Basename 10/31/11 1536 10/31/11 1535 10/30/11 1357 10/30/11 1352  HGB -- 13.8 12.8* --  HCT -- 39.0 37.6* --  PLT -- 369 376.0 --  APTT -- -- -- --  LABPROT -- 20.5* -- --  INR -- 1.72* -- 2.1  HEPARINUNFRC -- -- -- --  CREATININE -- 1.16 1.1 --  CKTOTAL 123 -- -- --  CKMB 1.8 -- -- --  TROPONINI <0.30 -- -- --   The CrCl is unknown because both a height and weight (above a minimum accepted value) are required for this calculation.  Medical History: Past Medical History  Diagnosis Date  . HTN (hypertension)   . HLD (hyperlipidemia)   . Family history of early CAD     Brother had MI at age 87 yo.  . Coronary artery disease   . ST elevation myocardial infarction (STEMI) of anterior wall 09/2011    s/p BMS to occluded LAD  . Cardiomyopathy, ischemic 09/2011    EF 40-45%   . Diastolic dysfunction   . 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 Elevated LFTs     Medications:  Prescriptions prior to admission  Medication Sig Dispense Refill  . clopidogrel (PLAVIX) 75 MG tablet Take 75 mg by mouth daily with breakfast.        . lisinopril (PRINIVIL,ZESTRIL) 5 MG tablet Take 5 mg by mouth daily.        . metoprolol (LOPRESSOR) 50 MG tablet Take 50 mg by mouth 2 (two) times daily.        . nitroGLYCERIN (NITROSTAT) 0.4 MG SL tablet Place 0.4 mg under the tongue every 5 (five) minutes as needed. For chest pain.       . valsartan-hydrochlorothiazide (DIOVAN-HCT) 160-12.5 MG per tablet Take 1 tablet by mouth Daily.      [name] VIAGRA 50 MG tablet Take 1 tablet by mouth Once daily as needed. For E.D.       . warfarin (COUMADIN) 5 MG tablet Take 5 mg by mouth daily. Take  as directed by the Anticoagulation clinic        Assessment: 45 yo man on coumadin at home for mural thrombus.  Home dose coumadin 7.5 mg MWF and 5 mg other days.  Admit INR is subtherapeutic.  Last dose taken today. Goal of Therapy:  INR 2-3   Plan:  Will give additional 2.5 mg tonight.  Check daily protimes.  Dawon Troop Poteet 10/31/2011,6:50 PM

## 2011-10-31 NOTE — ED Notes (Signed)
HR 190

## 2011-10-31 NOTE — ED Provider Notes (Signed)
History     CSN: [id number] Arrival date & time: 10/31/2011  3:02 PM   First MD Initiated Contact with Patient 10/31/11 1522      Chief Complaint  Patient presents with  . Chest Pain    (Consider location/radiation/quality/duration/timing/severity/associated sxs/prior treatment) Patient is a 45 y.o. male presenting with chest pain. The history is provided by the patient.  Chest Pain   The pt reports a history of LAD stenting on 10/12/2011.  Today he developed a slight chest tightness and then developed palpitations.  This occurred approximately 30 minutes prior to arrival.  The patient's had no shortness of breath or lightheadedness.  His had no syncope or near-syncope.  He denies chest pain at this time.  He denies shortness of breath.  He has no prior history of SVT.  He is on Coumadin for a mural thrombis.  Denies recent vomiting.  Does report one loose stool today.  Denies melena and hematochezia.  Reports he's had chills in the past 24 hours.  Nothing worsens the symptoms.  Nothing improves his symptoms.  The symptoms are moderate at this time  Past Medical History  Diagnosis Date  . HTN (hypertension)   . HLD (hyperlipidemia)   . Family history of early CAD     Brother had MI at age 43 yo.  . Coronary artery disease   . ST elevation myocardial infarction (STEMI) of anterior wall 09/2011    s/p BMS to occluded LAD  . Cardiomyopathy, ischemic 09/2011    EF 40-45%   . Diastolic dysfunction   . 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 Elevated LFTs     Past Surgical History  Procedure Date  . Cardiac catheterization     ss/p PCI BMS to LAD    Family History  Problem Relation Age of Onset  . Coronary artery disease Brother     Had MI in his 52 yo    History  Substance Use Topics  . Smoking status: Never Smoker   . Smokeless tobacco: Not on file  . Alcohol Use: 0.6 oz/week    1 Cans of beer per week      Review of Systems  Cardiovascular:  Positive for chest pain.  All other systems reviewed and are negative.    Allergies  Review of patient's allergies indicates no known allergies.  Home Medications   Current Outpatient Rx  Name Route Sig Dispense Refill  . CLOPIDOGREL BISULFATE 75 MG PO TABS Oral Take 75 mg by mouth daily with breakfast.      . LISINOPRIL 5 MG PO TABS Oral Take 5 mg by mouth daily.      [name] METOPROLOL TARTRATE 50 MG PO TABS Oral Take 50 mg by mouth 2 (two) times daily.      [name] NITROGLYCERIN 0.4 MG SL SUBL Sublingual Place 0.4 mg under the tongue every 5 (five) minutes as needed. For chest pain.     [name] VALSARTAN-HYDROCHLOROTHIAZIDE 160-12.5 MG PO TABS Oral Take 1 tablet by mouth Daily.    [name] VIAGRA 50 MG PO TABS Oral Take 1 tablet by mouth Once daily as needed. For E.D.     . Barron Alvine SODIUM 5 MG PO TABS Oral Take 5 mg by mouth daily. Take as directed by the Anticoagulation clinic      BP 128/86  Pulse 167  Temp(Src) 98.7 F (37.1 C) (Oral)  Resp 20  SpO2 100%  Physical Exam  Nursing note and vitals reviewed. Constitutional: He  is oriented to person, place, and time. He appears well-developed and well-nourished.  HENT:  Head: Normocephalic and atraumatic.  Eyes: EOM are normal.  Neck: Normal range of motion.  Cardiovascular: Regular rhythm, normal heart sounds and intact distal pulses.        Tachycardic  Pulmonary/Chest: Effort normal and breath sounds normal. No respiratory distress.  Abdominal: Soft. He exhibits no distension. There is no tenderness.  Musculoskeletal: Normal range of motion.  Neurological: He is alert and oriented to person, place, and time.  Skin: Skin is warm and dry.  Psychiatric: He has a normal mood and affect. Judgment normal.    ED Course  Procedures (including critical care time)    Date: 10/31/2011  Rate: 187  Rhythm: SVT  QRS Axis: normal  Intervals: normal  ST/T Wave abnormalities: ST elevation in V3-V5  Conduction Disutrbances:none  Narrative  Interpretation:   Old EKG Reviewed: Tachycardic rate new, SVT new, no specific changes to ST segments as compared to ecg on 10/16/2011  CRITICAL CARE Performed by: Lyanne Co   Total critical care time: 30 Critical care time was exclusive of separately billable procedures and treating other patients. Critical care was necessary to treat or prevent imminent or life-threatening deterioration. Critical care was time spent personally by me on the following activities: development of treatment plan with patient and/or surrogate as well as nursing, discussions with consultants, evaluation of patient's response to treatment, examination of patient, obtaining history from patient or surrogate, ordering and performing treatments and interventions, ordering and review of laboratory studies, ordering and review of radiographic studies, pulse oximetry and re-evaluation of patient's condition.   Labs Reviewed  COMPREHENSIVE METABOLIC PANEL - Abnormal; Notable for the following:    Potassium 3.3 (*)    Glucose, Bld 174 (*)    Total Bilirubin 0.2 (*)    GFR calc non Af Amer 76 (*)    GFR calc Af Amer 88 (*)    All other components within normal limits  PROTIME-INR - Abnormal; Notable for the following:    Prothrombin Time 20.5 (*)    INR 1.72 (*)    All other components within normal limits  POCT I-STAT TROPONIN I - Abnormal; Notable for the following:    Troponin i, poc 0.42 (*)    All other components within normal limits  CBC  TROPONIN I  CK TOTAL AND CKMB  OCCULT BLOOD, POC DEVICE  POCT OCCULT BLOOD STOOL, DEVICE  INFLUENZA PANEL BY PCR  BARTONELLA ANITBODY PANEL  POCT OCCULT BLOOD STOOL, DEVICE   Dg Chest Portable 1 View  10/31/2011  *RADIOLOGY REPORT*  Clinical Data: Chest palpitations.  PORTABLE CHEST - 1 VIEW  Comparison: PA and lateral chest 10/18/2011.  Findings: Left basilar airspace disease appears improved.  Right lung is clear.  Heart size is normal.  No pneumothorax or  pleural effusion.  IMPRESSION: Improved left lower lobe airspace disease.  No new abnormality.  Original Report Authenticated By: Bernadene Bell. D'ALESSIO, M.D.     1. SVT (supraventricular tachycardia)       MDM  His ST segments were concerning on arrival in that side obtain cardiology consultation early in his care.  Dr. Carolanne Grumbling came to the bedside to help me evaluate this patient.  He's given initially 6 of adenosine followed by 12 of adenosine with conversion to sinus rhythm.  There did not appear to be flutter waves.  After control of his rate he no longer was having palpitations.  He denies chest  pain or shortness of breath at this time.  Is very concerning the patient's having new SVT approximately 3 weeks out from his LAD intervention.  The patient will be admitted overnight for observation.  Does have an initial troponin of 0.42.  He's been given an aspirin in the emergency department.  We're continuing to monitor this patient closely for recurrent symptoms.  At this time of health anticoagulation as the patient is on Coumadin.  Aleve additional anticoagulation of the cardiology team.  Workup from this point forward will be per cardiology        Lyanne Co, MD 10/31/11 1700

## 2011-10-31 NOTE — ED Notes (Signed)
Reports approx 30 mins pta, had onset of palpitations and mild sob. Denies chest pain. Had cardiac cath done on 11/19. HR 160-190 on arrival to ed.

## 2011-11-01 ENCOUNTER — Other Ambulatory Visit: Payer: Self-pay

## 2011-11-01 DIAGNOSIS — I471 Supraventricular tachycardia: Secondary | ICD-10-CM

## 2011-11-01 LAB — BASIC METABOLIC PANEL
Calcium: 9.2 mg/dL (ref 8.4–10.5)
Creatinine, Ser: 1 mg/dL (ref 0.50–1.35)
GFR calc Af Amer: >90 mL/min (ref >90)
GFR calc non Af Amer: >90 mL/min (ref >90)

## 2011-11-01 LAB — INFLUENZA PANEL BY PCR (TYPE A & B): H1N1 flu by pcr: NOT DETECTED

## 2011-11-01 LAB — PROTIME-INR
INR: 1.93 — ABNORMAL HIGH (ref 0.00–1.49)
Prothrombin Time: 22.4 s — ABNORMAL HIGH (ref 11.6–15.2)

## 2011-11-01 LAB — TSH: TSH: 0.49 u[IU]/mL (ref 0.350–4.500)

## 2011-11-01 MED ORDER — OFF THE BEAT BOOK
Freq: Once | Status: AC
  Administered 2011-11-01: 08:00:00
  Filled 2011-11-01: qty 1

## 2011-11-01 MED ORDER — METOPROLOL TARTRATE 50 MG PO TABS: 50.0000 mg | ORAL_TABLET | Freq: Two times a day (BID) | ORAL | Status: DC

## 2011-11-01 NOTE — Discharge Summary (Signed)
Physician Discharge Summary  Patient ID: Rodney Marquez MRN: [id number] DOB/AGE: 1966/02/07 45 y.o.  Admit date: 10/31/2011 Discharge date: 11/01/2011  Primary Cardiologist: Bonnee Quin, MD  Primary Discharge Diagnosis: SVT  - successful conversion to NSR with Adenosine  Secondary Discharge Diagnoses: CAD  - R/O for MI, this admission  - Status post recent anterior STEMI/emergent BMS LAD  - EF 40-45%  - LV mural thrombus, started on Coumadin HTN HLD Elevated LFTs  Reason for Admission: [name], history as outlined above, who presented with complaint of palpitations, and found to be in SVT at 170, successfully treated with a total of 12 mg of adenosine, with conversion to NSR.  Procedures: none  Hospital Course: Patient maintained NSR during his brief stay. Cardiac markers were negative, TSH normal. Mild hypokalemia was repleted. Patient was maintained on Coumadin and Plavix, and is scheduled for a PT/INR in our Coumadin clinic this coming Wednesday, December 12. Recommendation was to continue treatment with Lopressor.  Discharge Vitals: Blood pressure 120/79, pulse 76, temperature 98.3 F (36.8 C), temperature source Oral, resp. rate 18, height 5\' 7"  (1.702 m), weight 164 lb 10.9 oz (74.7 kg), SpO2 100.00%.  Labs:   Lab Results  Component Value Date   WBC 6.5 10/31/2011   HGB 12.8* 10/31/2011   HCT 36.0* 10/31/2011   MCV 91.1 10/31/2011   PLT 331 10/31/2011     Lab 11/01/11 0625 10/31/11 1847  NA 140 --  K 4.3 --  CL 107 --  CO2 25 --  BUN 6 --  CREATININE 1.00 --  CALCIUM 9.2 --  PROT -- 6.7  BILITOT -- 0.2*  ALKPHOS -- 79  ALT -- 26  AST -- 19  GLUCOSE 99 --   Lab Results  Component Value Date   CKTOTAL 123 10/31/2011   CKMB 1.8 10/31/2011   TROPONINI <0.30 10/31/2011    Lab Results  Component Value Date   CHOL 172 10/13/2011   Lab Results  Component Value Date   HDL 70 10/13/2011   Lab Results  Component Value Date   LDLCALC 90 10/13/2011     Lab Results  Component Value Date   TRIG 61 10/13/2011   Lab Results  Component Value Date   CHOLHDL 2.5 10/13/2011   No results found for this basename: LDLDIRECT     DISPOSITION: Stable condition  FOLLOW UP PLANS AND APPOINTMENTS: Discharge Orders    Future Appointments: Provider: Department: Dept Phone: Center:   11/04/2011 10:15 AM Raul Del, RN [facility] [phone] None   11/04/2011 10:30 AM Beatrice Lecher, PA [facility] [phone] [id number]     Future Orders Please Complete By Expires   Diet - low sodium heart healthy      Increase activity slowly        Follow-up Information    Follow up with Shawnie Pons, MD on 11/04/2011. (as scheduled)    Contact information:   [address] [place] [address] [phone]       Follow up on 11/04/2011. (Coumadin clinic, as scheduled)          DISCHARGE MEDICATIONS: Current Discharge Medication List    CONTINUE these medications which have CHANGED   Details  metoprolol (LOPRESSOR) 50 MG tablet Take 1 tablet (50 mg total) by mouth 2 (two) times daily. Qty: 60 tablet, Refills: 6      CONTINUE these medications which have NOT CHANGED   Details  clopidogrel (PLAVIX) 75 MG tablet Take 75 mg by mouth daily with breakfast.      lisinopril (PRINIVIL,ZESTRIL) 5 MG tablet Take 5 mg by mouth daily.      nitroGLYCERIN (NITROSTAT) 0.4 MG SL tablet Place 0.4 mg under the tongue every 5 (five) minutes as needed. For chest pain.     valsartan-hydrochlorothiazide (DIOVAN-HCT) 160-12.5 MG per tablet Take 1 tablet by mouth Daily.    warfarin (COUMADIN) 5 MG tablet Take 5 mg by mouth daily. Take as directed by the Anticoagulation clinic      STOP taking these medications     VIAGRA 50 MG tablet         BRING ALL MEDICATIONS WITH YOU TO FOLLOW UP APPOINTMENTS  Time spent with patient to include physician time: Greater than 30 minutes,  including physician time.  Signed: Gene Brittinie Wherley 11/01/2011, 12:09 PM Co-Sign MD

## 2011-11-01 NOTE — Discharge Summary (Signed)
See rounding note from today Rodney Marquez 1:32 PM 11/01/2011

## 2011-11-01 NOTE — Progress Notes (Signed)
Patient ID: Rodney Marquez, male   DOB: October 25, 1966, 45 y.o.   MRN: [id number] @ Subjective:  Denies SSCP, palpitations or Dyspnea Denies recent viagra use  Objective:  Filed Vitals:   10/31/11 1531 10/31/11 1833 10/31/11 2100 11/01/11 0500  BP: 128/86 131/84 110/70 120/79  Pulse:  83 70 76  Temp:  98 F (36.7 C) 98.1 F (36.7 C) 98.3 F (36.8 C)  TempSrc:   Oral Oral  Resp:   18 18  Height:   5\' 7"  (1.702 m)   Weight:   74.7 kg (164 lb 10.9 oz)   SpO2: 100% 99% 100% 100%    Intake/Output from previous day:  Intake/Output Summary (Last 24 hours) at 11/01/11 1035 Last data filed at 10/31/11 1629  Gross per 24 hour  Intake   1000 ml  Output      0 ml  Net   1000 ml    Physical Exam: General appearance: alert and no distress Lungs: clear to auscultation bilaterally Heart: regular rate and rhythm, S1, S2 normal, no murmur, click, rub or gallop Extremities: extremities normal, atraumatic, no cyanosis or edema Pulses: 2+ and symmetric  Lab Results: Basic Metabolic Panel:  Basename 11/01/11 0625 10/31/11 1847  NA 140 138  K 4.3 4.4  CL 107 106  CO2 25 25  GLUCOSE 99 116*  BUN 6 6  CREATININE 1.00 1.06  CALCIUM 9.2 8.7  MG -- 2.1  PHOS -- 1.8*   Liver Function Tests:  [facility] 10/31/11 1847 10/31/11 1535  AST 19 19  ALT 26 27  ALKPHOS 79 90  BILITOT 0.2* 0.2*  PROT 6.7 7.5  ALBUMIN 3.2* 3.6   No results found for this basename: LIPASE:2,AMYLASE:2 in the last 72 hours CBC:  Basename 10/31/11 1847 10/31/11 1535 10/30/11 1357  WBC 6.5 7.7 --  NEUTROABS 5.1 -- 2.7  HGB 12.8* 13.8 --  HCT 36.0* 39.0 --  MCV 91.1 91.5 --  PLT 331 369 --   Cardiac Enzymes:  Basename 10/31/11 1536  CKTOTAL 123  CKMB 1.8  CKMBINDEX --  TROPONINI <0.30   BNP: No results found for this basename: POCBNP:3 in the last 72 hours D-Dimer: No results found for this basename: DDIMER:2 in the last 72 hours Hemoglobin A1C: No results found for this basename: HGBA1C in the last  72 hours Fasting Lipid Panel: No results found for this basename: CHOL,HDL,LDLCALC,TRIG,CHOLHDL,LDLDIRECT in the last 72 hours Thyroid Function Tests:  Basename 10/31/11 1847  TSH 0.490  T4TOTAL --  T3FREE --  THYROIDAB --   Anemia Panel: No results found for this basename: VITAMINB12,FOLATE,FERRITIN,TIBC,IRON,RETICCTPCT in the last 72 hours  Imaging: Dg Chest Portable 1 View  10/31/2011  *RADIOLOGY REPORT*  Clinical Data: Chest palpitations.  PORTABLE CHEST - 1 VIEW  Comparison: PA and lateral chest 10/18/2011.  Findings: Left basilar airspace disease appears improved.  Right lung is clear.  Heart size is normal.  No pneumothorax or pleural effusion.  IMPRESSION: Improved left lower lobe airspace disease.  No new abnormality.  Original Report Authenticated By: Bernadene Bell. Maricela Curet, M.D.    Cardiac Studies:  ECG:  Orders placed during the hospital encounter of 10/31/11  . EKG 12-LEAD  . EKG 12-LEAD  . EKG 12-LEAD  . EKG 12-LEAD  . EKG 12-LEAD     Telemetry:  NSR no VT or recurrent sVT :   Medications:     . adenosine (ADENOCARD) IV  12 mg Intravenous Once  . adenosine      .  adenosine      . aspirin  325 mg Oral Once  . clopidogrel  75 mg Oral Q breakfast  . lisinopril  5 mg Oral Daily  . metoprolol      . metoprolol  2.5 mg Intravenous Once  . metoprolol tartrate  50 mg Oral BID  . off the beat book   Does not apply Once  . pneumococcal 23 valent vaccine  0.5 mL Intramuscular Tomorrow-1000  . potassium chloride  40 mEq Oral Once  . sodium chloride  3 mL Intravenous Q12H  . warfarin  2.5 mg Oral Once       Assessment/Plan:  SVT:  Antecedant history of  Broke with adenosine nonrecurrent  D/C with lopressor CAD:  Stable recent anterior MI  Enzymes negative  F/U [facility] 11/01/2011, 10:35 AM

## 2011-11-04 ENCOUNTER — Ambulatory Visit (INDEPENDENT_AMBULATORY_CARE_PROVIDER_SITE_OTHER): Payer: BC Managed Care – PPO | Admitting: *Deleted

## 2011-11-04 ENCOUNTER — Ambulatory Visit (INDEPENDENT_AMBULATORY_CARE_PROVIDER_SITE_OTHER): Payer: BC Managed Care – PPO | Admitting: Physician Assistant

## 2011-11-04 ENCOUNTER — Encounter: Payer: Self-pay | Admitting: Physician Assistant

## 2011-11-04 VITALS — BP 114/78 | HR 62 | Ht 67.0 in | Wt 169.0 lb

## 2011-11-04 DIAGNOSIS — I236 Thrombosis of atrium, auricular appendage, and ventricle as current complications following acute myocardial infarction: Secondary | ICD-10-CM

## 2011-11-04 DIAGNOSIS — I2510 Atherosclerotic heart disease of native coronary artery without angina pectoris: Secondary | ICD-10-CM

## 2011-11-04 DIAGNOSIS — E785 Hyperlipidemia, unspecified: Secondary | ICD-10-CM

## 2011-11-04 DIAGNOSIS — I471 Supraventricular tachycardia, unspecified: Secondary | ICD-10-CM

## 2011-11-04 DIAGNOSIS — I238 Other current complications following acute myocardial infarction: Secondary | ICD-10-CM

## 2011-11-04 DIAGNOSIS — I498 Other specified cardiac arrhythmias: Secondary | ICD-10-CM

## 2011-11-04 DIAGNOSIS — G4700 Insomnia, unspecified: Secondary | ICD-10-CM

## 2011-11-04 DIAGNOSIS — I10 Essential (primary) hypertension: Secondary | ICD-10-CM

## 2011-11-04 DIAGNOSIS — Z7901 Long term (current) use of anticoagulants: Secondary | ICD-10-CM

## 2011-11-04 LAB — BASIC METABOLIC PANEL
BUN: 11 mg/dL (ref 6–23)
Calcium: 9 mg/dL (ref 8.4–10.5)
GFR: 108.45 mL/min (ref >60.00)
Glucose, Bld: 100 mg/dL — ABNORMAL HIGH (ref 70–99)
Potassium: 4.2 mEq/L (ref 3.5–5.1)

## 2011-11-04 LAB — HEPATIC FUNCTION PANEL
AST: 18 U/L (ref 0–37)
Albumin: 3.9 g/dL (ref 3.5–5.2)

## 2011-11-04 MED ORDER — ZOLPIDEM TARTRATE 5 MG PO TABS: 5.0000 mg | ORAL_TABLET | Freq: Every evening | ORAL | Status: DC | PRN

## 2011-11-04 NOTE — Assessment & Plan Note (Signed)
Continue Coumadin.  Plan is for followup echocardiogram 3 months post MI to demonstrate resolution.

## 2011-11-04 NOTE — Assessment & Plan Note (Signed)
Controlled.  Check a basic metabolic panel today. 

## 2011-11-04 NOTE — Assessment & Plan Note (Signed)
He is doing well post MI/PCI.  Continue Plavix and Coumadin.  He did not think that his insurance will cover Cardiac rehabilitation.  I have recommended he continue to increase his activity on his own.  He should remain out of work for now.  I will have him followup with Dr.  Shawnie Pons in 3-4 weeks.

## 2011-11-04 NOTE — Assessment & Plan Note (Signed)
He states that he has had this for years.  Eventually, we can consider referral to electrophysiology for further evaluation and management.

## 2011-11-04 NOTE — Assessment & Plan Note (Signed)
I will give him Ambien 5 mg q.h.s. P.r.n., #20, no refills.

## 2011-11-04 NOTE — Patient Instructions (Addendum)
You have been referred to 3-4 WEEKS WITH DR. Riley Kill PER SCOTT WEAVER, PA-C  Your physician has recommended you make the following change in your medication: AMBIEN 5 MG 1 TABLET EVERY NIGHT AS NEEDED   Your physician recommends that you return for lab work in: TODAY BMET, LFT 272.4, 414.01   YOU HAVE BEEN GIVEN A PRESCRIPTION FOR AMBIEN 5 MG 1 TABLET EVERY NIGHT AS NEEDED

## 2011-11-04 NOTE — Assessment & Plan Note (Signed)
LFTs elevated in the hospital.  I will repeat those today along with a basic metabolic panel.  If his LFTs have normalized, we will need to get Him on a statin.

## 2011-11-04 NOTE — Progress Notes (Signed)
[address] [place], [place]  [address] Phone: [phone] Fax:  [phone]  Date:  11/04/2011   Name:  Rodney Marquez       DOB:  01-22-1966 MRN:  [id number]  PCP:  Dr. Rene Paci Primary Cardiologist:  Dr.  Shawnie Pons    History of Present Illness: Rodney Marquez is a 45 y.o. male presents for post hospital follow up.   He was admitted 11/19-11/28 with a late presentation anterior STEMI.  LHC 10/12/11: oLM 20%, LAD occluded with dissection 2/2 MI.  PCI with BMS to LAD with poor distal runoff; small caliber apical vessel c/w edematous apex from delayed presentation.  Echocardiogram initially did not demonstrate an apical thrombus but there was akinesis of the apical cap.  His EF was 40%.  Followup echo 10/16/11: Mild LVH, EF 45-50%, very apical akinesis, LV apical thrombus, grade 2 diastolic dysfunction, trivial MR, PASP 29.  He had elevated LFTs.  Abdominal ultrasound was unremarkable.  His Statin was held.  He had unexplained fevers.  Blood cultures, urine culture and chest x-ray were all normal.  He was placed on Coumadin and Plavix due to his apical thrombus and recent PCI or ACS.  Aspirin was discontinued.  He will need Coumadin for 3 months and repeat echocardiogram at that time.  Unfortunately, he was admitted again on 12/8-12/9 with SVT.  This was successfully converted with adenosine.  His heart rate was 170.  Cardiac markers remain normal.  TSH was also normal At 0.490.   Labs: Hemoglobin 12.8, potassium 4.3, creatinine 1.0.  Overall he Is doing well.  He denies recurrent palps.  He has occasional atypical chest pains.  These are not really related to meals.  He denies exertional chest pain or shortness of breath.  He denies any chest discomfort reminiscent of his MI.  No orthopnea, PND or edema.  No syncope.  He is having difficulty sleeping.  Because of this he notes increased fatigue.  He wants to know if there is anything he can take to help improve his  sleeping.  Past Medical History  Diagnosis Date  . HTN (hypertension)   . HLD (hyperlipidemia)   . Family history of early CAD     Brother had MI at age 24 yo.  . Coronary artery disease     late presentation with ant STEMI 11/12: LHC 10/12/11: oLM 20%, LAD occluded with dissection 2/2 MI.  PCI with BMS to LAD with poor distal runoff; small caliber apical vessel c/w edematous apex from delayed presentation.  . ST elevation myocardial infarction (STEMI) of anterior wall 09/2011    s/p BMS to occluded LAD  . Cardiomyopathy, ischemic 09/2011    EF 40-45%;  echo 10/16/11: Mild LVH, EF 45-50%, very apical akinesis, LV apical thrombus, grade 2 diastolic dysfunction, trivial MR, PASP 29.  . 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 Elevated LFTs     Abdominal ultrasound was unremarkable  . PSVT (paroxysmal supraventricular tachycardia)     s/p adenocard 12 mg IVP 10/2011    Current Outpatient Prescriptions  Medication Sig Dispense Refill  . clopidogrel (PLAVIX) 75 MG tablet Take 75 mg by mouth daily with breakfast.        . lisinopril (PRINIVIL,ZESTRIL) 5 MG tablet Take 5 mg by mouth daily.        . metoprolol (LOPRESSOR) 50 MG tablet Take 1 tablet (50 mg total) by mouth 2 (two) times daily.  60 tablet  6  . nitroGLYCERIN (NITROSTAT) 0.4 MG SL tablet Place 0.4 mg under the tongue every 5 (five) minutes as needed. For chest pain.       . valsartan-hydrochlorothiazide (DIOVAN-HCT) 160-12.5 MG per tablet Take 1 tablet by mouth Daily.      [name] warfarin (COUMADIN) 5 MG tablet Take 5 mg by mouth daily. Take as directed by the Anticoagulation clinic        Allergies: No Known Allergies  History  Substance Use Topics  . Smoking status: Never Smoker   . Smokeless tobacco: Not on file  . Alcohol Use: 0.6 oz/week    1 Cans of beer per week     ROS:  Please see the history of present illness.    All other systems reviewed and negative.   PHYSICAL EXAM: VS:  BP 114/78   Pulse 62  Ht 5\' 7"  (1.702 m)  Wt 169 lb (76.658 kg)  BMI 26.47 kg/m2 Well nourished, well developed, in no acute distress HEENT: normal Neck: no JVD Vascular: No carotid bruits Cardiac:  normal S1, S2; RRR; no murmur Lungs:  clear to auscultation bilaterally, no wheezing, rhonchi or rales Abd: soft, nontender, no hepatomegaly Ext: no edema; Right femoral arteriotomy site without hematoma or bruit Skin: warm and dry Neuro:  CNs 2-12 intact, no focal abnormalities noted  EKG:   NSR, HR 62, diffuse TW inversions 1, 2, 3, aVL, aVF, V2-6 -- evolving MI  ASSESSMENT AND PLAN:

## 2011-11-05 ENCOUNTER — Other Ambulatory Visit: Payer: Self-pay | Admitting: *Deleted

## 2011-11-05 ENCOUNTER — Telehealth: Payer: Self-pay | Admitting: *Deleted

## 2011-11-05 DIAGNOSIS — E785 Hyperlipidemia, unspecified: Secondary | ICD-10-CM

## 2011-11-05 MED ORDER — PRAVASTATIN SODIUM 40 MG PO TABS: 40.0000 mg | ORAL_TABLET | Freq: Every evening | ORAL | Status: DC

## 2011-11-05 NOTE — Telephone Encounter (Signed)
start pravastatin 40, lft 12/22/10. Rodney Marquez

## 2011-11-20 ENCOUNTER — Ambulatory Visit (INDEPENDENT_AMBULATORY_CARE_PROVIDER_SITE_OTHER): Payer: BC Managed Care – PPO | Admitting: *Deleted

## 2011-11-20 DIAGNOSIS — I236 Thrombosis of atrium, auricular appendage, and ventricle as current complications following acute myocardial infarction: Secondary | ICD-10-CM

## 2011-11-20 DIAGNOSIS — Z7901 Long term (current) use of anticoagulants: Secondary | ICD-10-CM

## 2011-11-20 DIAGNOSIS — I238 Other current complications following acute myocardial infarction: Secondary | ICD-10-CM

## 2011-11-20 LAB — POCT INR: INR: 2.6

## 2011-12-11 ENCOUNTER — Encounter: Payer: BC Managed Care – PPO | Admitting: *Deleted

## 2011-12-11 ENCOUNTER — Ambulatory Visit (INDEPENDENT_AMBULATORY_CARE_PROVIDER_SITE_OTHER): Payer: Self-pay | Admitting: *Deleted

## 2011-12-11 DIAGNOSIS — Z7901 Long term (current) use of anticoagulants: Secondary | ICD-10-CM

## 2011-12-11 DIAGNOSIS — I236 Thrombosis of atrium, auricular appendage, and ventricle as current complications following acute myocardial infarction: Secondary | ICD-10-CM

## 2011-12-11 DIAGNOSIS — I238 Other current complications following acute myocardial infarction: Secondary | ICD-10-CM

## 2011-12-15 ENCOUNTER — Telehealth: Payer: Self-pay | Admitting: Cardiology

## 2011-12-15 NOTE — Telephone Encounter (Signed)
New Problem:     Patient called in today wondering what the status was of the disability form that he brought in about two weeks ago. Please call back.

## 2011-12-16 NOTE — Telephone Encounter (Signed)
I left a message with [employer] to see if this patient's paperwork was sent to them.

## 2011-12-16 NOTE — Telephone Encounter (Signed)
Lela contacted this pt about paperwork and she has called to request that another copy of form be faxed to our office.

## 2011-12-21 ENCOUNTER — Encounter: Payer: Self-pay | Admitting: Cardiology

## 2011-12-21 ENCOUNTER — Other Ambulatory Visit: Payer: Self-pay | Admitting: *Deleted

## 2011-12-21 ENCOUNTER — Ambulatory Visit (INDEPENDENT_AMBULATORY_CARE_PROVIDER_SITE_OTHER): Payer: BC Managed Care – PPO | Admitting: Cardiology

## 2011-12-21 DIAGNOSIS — I10 Essential (primary) hypertension: Secondary | ICD-10-CM

## 2011-12-21 DIAGNOSIS — I219 Acute myocardial infarction, unspecified: Secondary | ICD-10-CM

## 2011-12-21 DIAGNOSIS — I513 Intracardiac thrombosis, not elsewhere classified: Secondary | ICD-10-CM

## 2011-12-21 DIAGNOSIS — E7800 Pure hypercholesterolemia, unspecified: Secondary | ICD-10-CM

## 2011-12-21 DIAGNOSIS — I2510 Atherosclerotic heart disease of native coronary artery without angina pectoris: Secondary | ICD-10-CM

## 2011-12-21 LAB — CBC WITH DIFFERENTIAL/PLATELET
Basophils Absolute: 0 10*3/uL (ref 0.0–0.1)
Eosinophils Absolute: 0 10*3/uL (ref 0.0–0.7)
Lymphocytes Relative: 28.6 % (ref 12.0–46.0)
MCHC: 34.2 g/dL (ref 30.0–36.0)
Neutrophils Relative %: 62.7 % (ref 43.0–77.0)
Platelets: 278 10*3/uL (ref 150.0–400.0)
RDW: 14.9 % — ABNORMAL HIGH (ref 11.5–14.6)

## 2011-12-21 LAB — HEPATIC FUNCTION PANEL
AST: 16 U/L (ref 0–37)
Albumin: 3.9 g/dL (ref 3.5–5.2)
Alkaline Phosphatase: 79 U/L (ref 39–117)

## 2011-12-21 LAB — BASIC METABOLIC PANEL
CO2: 28 mEq/L (ref 19–32)
Calcium: 9.2 mg/dL (ref 8.4–10.5)
Creatinine, Ser: 1 mg/dL (ref 0.4–1.5)
Glucose, Bld: 99 mg/dL (ref 70–99)

## 2011-12-21 LAB — LIPID PANEL
Total CHOL/HDL Ratio: 5
Triglycerides: 71 mg/dL (ref 0.0–149.0)

## 2011-12-21 NOTE — Progress Notes (Signed)
HPI:  He is doing well.  No major issues at this point.  Denies further chest pain.  Is on combo of warfarin and plavix  ([facility]est), and has LV mural thrombus.  No major issues.  We discussed his work and he says he needs to get back to work.  He operates a Systems analyst.  Denies current symptoms.   Current Outpatient Prescriptions  Medication Sig Dispense Refill  . clopidogrel (PLAVIX) 75 MG tablet Take 75 mg by mouth daily with breakfast.        . lisinopril (PRINIVIL,ZESTRIL) 5 MG tablet Take 5 mg by mouth daily.        . metoprolol (LOPRESSOR) 50 MG tablet Take 1 tablet (50 mg total) by mouth 2 (two) times daily.  60 tablet  6  . nitroGLYCERIN (NITROSTAT) 0.4 MG SL tablet Place 0.4 mg under the tongue every 5 (five) minutes as needed. For chest pain.       . pravastatin (PRAVACHOL) 40 MG tablet Take 1 tablet (40 mg total) by mouth every evening.  30 tablet  11  . warfarin (COUMADIN) 5 MG tablet Take 5 mg by mouth daily. Take as directed by the Anticoagulation clinic      . zolpidem (AMBIEN) 5 MG tablet Take 1 tablet (5 mg total) by mouth at bedtime as needed for sleep.  20 tablet  0    No Known Allergies  Past Medical History  Diagnosis Date  . HTN (hypertension)   . HLD (hyperlipidemia)   . Family history of early CAD     Brother had MI at age 76 yo.  . Coronary artery disease     late presentation with ant STEMI 11/12: LHC 10/12/11: oLM 20%, LAD occluded with dissection 2/2 MI.  PCI with BMS to LAD with poor distal runoff; small caliber apical vessel c/w edematous apex from delayed presentation.  . ST elevation myocardial infarction (STEMI) of anterior wall 09/2011    s/p BMS to occluded LAD  . Cardiomyopathy, ischemic 09/2011    EF 40-45%;  echo 10/16/11: Mild LVH, EF 45-50%, very apical akinesis, LV apical thrombus, grade 2 diastolic dysfunction, trivial MR, PASP 29.  . 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 Elevated LFTs     Abdominal ultrasound was unremarkable   . PSVT (paroxysmal supraventricular tachycardia)     s/p adenocard 12 mg IVP 10/2011    Past Surgical History  Procedure Date  . Cardiac catheterization     ss/p PCI BMS to LAD    Family History  Problem Relation Age of Onset  . Coronary artery disease Brother     Had MI in his 37 yo    History   Social History  . Marital Status: Married    Spouse Name: N/A    Number of Children: N/A  . Years of Education: N/A   Occupational History  . Not on file.   Social History Main Topics  . Smoking status: Never Smoker   . Smokeless tobacco: Not on file  . Alcohol Use: 0.6 oz/week    1 Cans of beer per week  . Drug Use: No  . Sexually Active: Not on file   Other Topics Concern  . Not on file   Social History Narrative  . No narrative on file    ROS: Please see the HPI.  All other systems reviewed and negative.  PHYSICAL EXAM:  BP 118/78  Pulse 68  Ht 5\' 7"  (1.702 m)  Wt 75.805 kg (167 lb 1.9 oz)  BMI 26.17 kg/m2  General: Well developed, well nourished, in no acute distress. Head:  Normocephalic and atraumatic. Neck: no JVD Lungs: Clear to auscultation and percussion. Heart: Normal S1 and S2.  No murmur, rubs or gallops.  Pulses: Pulses normal in all 4 extremities. Extremities: No clubbing or cyanosis. No edema. Neurologic: Alert and oriented x 3.  EKG:  ASSESSMENT AND PLAN:

## 2011-12-21 NOTE — Telephone Encounter (Signed)
The pt is coming into the office today for an appointment and Dr Riley Kill will complete form at that time.

## 2011-12-21 NOTE — Progress Notes (Signed)
Patient ID: Rodney Marquez, male   DOB: 27-Sep-1966, 46 y.o.   MRN: [id number]

## 2011-12-21 NOTE — Assessment & Plan Note (Signed)
Plan GXT later this week to determine return to work.

## 2011-12-21 NOTE — Patient Instructions (Signed)
Your physician has requested that you have an exercise tolerance test (12/25/11). For further information please visit [email or web address] Please also follow instruction sheet, as given.  Your physician recommends that you have lab work today: LIPID, LIVER, BMP, CBC  Your physician recommends that you continue on your current medications as directed. Please refer to the Current Medication list given to you today.  Your physician recommends that you schedule a follow-up appointment in: 6 WEEKS  Your physician has requested that you have an echocardiogram in 6 WEEKS. Echocardiography is a painless test that uses sound waves to create images of your heart. It provides your doctor with information about the size and shape of your heart and how well your heart's chambers and valves are working. This procedure takes approximately one hour. There are no restrictions for this procedure.

## 2011-12-21 NOTE — Assessment & Plan Note (Signed)
Check 2D echo in another six weeks.  Duration of warfarin should be in the range of 4 months.

## 2011-12-21 NOTE — Assessment & Plan Note (Signed)
Well controlled at present

## 2011-12-21 NOTE — Assessment & Plan Note (Signed)
Appropriate time to check lipid and liver.  Had some abnormal liver enzymes at the hospital, so will recheck.

## 2011-12-23 ENCOUNTER — Ambulatory Visit: Payer: BC Managed Care – PPO | Admitting: Cardiology

## 2011-12-23 ENCOUNTER — Other Ambulatory Visit: Payer: BC Managed Care – PPO | Admitting: *Deleted

## 2011-12-25 ENCOUNTER — Ambulatory Visit (INDEPENDENT_AMBULATORY_CARE_PROVIDER_SITE_OTHER): Payer: BC Managed Care – PPO | Admitting: Cardiology

## 2011-12-25 DIAGNOSIS — E7800 Pure hypercholesterolemia, unspecified: Secondary | ICD-10-CM

## 2011-12-25 DIAGNOSIS — I513 Intracardiac thrombosis, not elsewhere classified: Secondary | ICD-10-CM

## 2011-12-25 DIAGNOSIS — I2510 Atherosclerotic heart disease of native coronary artery without angina pectoris: Secondary | ICD-10-CM

## 2011-12-25 DIAGNOSIS — E785 Hyperlipidemia, unspecified: Secondary | ICD-10-CM

## 2011-12-25 DIAGNOSIS — I10 Essential (primary) hypertension: Secondary | ICD-10-CM

## 2011-12-25 MED ORDER — PRAVASTATIN SODIUM 80 MG PO TABS: 80.0000 mg | ORAL_TABLET | Freq: Every evening | ORAL | Status: DC

## 2011-12-25 NOTE — Progress Notes (Signed)
Patient ID: Rodney Marquez, male   DOB: 11/12/1966, 45 y.o.   MRN: [id number]  

## 2011-12-25 NOTE — Progress Notes (Signed)
Exercise Treadmill Test  Pre-Exercise Testing Evaluation Rhythm: normal sinus  Rate: 61   PR:  .14 QRS:  .10  QT:  40 QTc: 41     Test  Exercise Tolerance Test Ordering MD: Shawnie Pons, MD  Interpreting MD:  Shawnie Pons, MD  Unique Test No: 1  Treadmill:  1  Indication for ETT: HTN  Contraindication to ETT: No   Stress Modality: exercise - treadmill  Cardiac Imaging Performed: non   Protocol: standard Bruce - maximal  Max BP:  148/82/   Max MPHR (bpm):  177 85% MPR (bpm):  150  MPHR obtained (bpm):  130 % MPHR obtained:  78  Reached 85% MPHR (min:sec):  na Total Exercise Time (min-sec):  9:25  Workload in METS:  10.7 Borg Scale: 13  Reason ETT Terminated:  fatigue    ST Segment Analysis At Rest: normal ST segments - no evidence of significant ST depression With Exercise: no evidence of significant ST depression  Other Information Arrhythmia:  No Angina during ETT:  absent (0) Quality of ETT:  non-diagnostic  ETT Interpretation:  borderline (indeterminate) with non-specific ST changes  Comments:  He exercised at an excellent work load without chest pain or ST depression.  His workload was good.  He had no exercise induced ST change, although his baseline ECG is abnormal  (Since his MI).  He has persistent diffuse T wave inversion as expected with distal no reflow.  He has been asymptomatic.     Recommendations: He wants to return to work.  He operates a machine, but mainly puts parts in.  He says it is not strenuous operating the machines.  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  He does not forsee issues with regard to warfarin and his work.  We will also start low dose pravachol.

## 2011-12-25 NOTE — Patient Instructions (Addendum)
Your physician has recommended you make the following change in your medication: INCREASE Pravastatin to 80mg  take one by mouth daily  Your physician recommends that you return for a FASTING LIPID and LIVER Profile in 4 WEEKS--nothing to eat or drink after midnight, lab opens at 8:30  Your physician recommends that you schedule a follow-up appointment in: 6 WEEKS with Dr Riley Kill

## 2012-01-08 ENCOUNTER — Ambulatory Visit (INDEPENDENT_AMBULATORY_CARE_PROVIDER_SITE_OTHER): Payer: BC Managed Care – PPO | Admitting: *Deleted

## 2012-01-08 DIAGNOSIS — Z7901 Long term (current) use of anticoagulants: Secondary | ICD-10-CM

## 2012-01-08 DIAGNOSIS — I236 Thrombosis of atrium, auricular appendage, and ventricle as current complications following acute myocardial infarction: Secondary | ICD-10-CM

## 2012-01-08 DIAGNOSIS — I238 Other current complications following acute myocardial infarction: Secondary | ICD-10-CM

## 2012-01-08 LAB — POCT INR: INR: 2.9

## 2012-01-17 ENCOUNTER — Emergency Department (HOSPITAL_COMMUNITY): Payer: BC Managed Care – PPO

## 2012-01-17 ENCOUNTER — Observation Stay (HOSPITAL_COMMUNITY)
Admission: EM | Admit: 2012-01-17 | Discharge: 2012-01-18 | Disposition: A | Discharge status: Home / Self Care | Payer: BC Managed Care – PPO | Attending: Internal Medicine | Admitting: Internal Medicine

## 2012-01-17 ENCOUNTER — Encounter (HOSPITAL_COMMUNITY): Payer: Self-pay | Admitting: Emergency Medicine

## 2012-01-17 ENCOUNTER — Other Ambulatory Visit: Payer: Self-pay

## 2012-01-17 DIAGNOSIS — R0789 Other chest pain: Secondary | ICD-10-CM

## 2012-01-17 DIAGNOSIS — I059 Rheumatic mitral valve disease, unspecified: Secondary | ICD-10-CM | POA: Insufficient documentation

## 2012-01-17 DIAGNOSIS — I471 Supraventricular tachycardia, unspecified: Secondary | ICD-10-CM

## 2012-01-17 DIAGNOSIS — I252 Old myocardial infarction: Secondary | ICD-10-CM | POA: Insufficient documentation

## 2012-01-17 DIAGNOSIS — J189 Pneumonia, unspecified organism: Secondary | ICD-10-CM

## 2012-01-17 DIAGNOSIS — I2109 ST elevation (STEMI) myocardial infarction involving other coronary artery of anterior wall: Secondary | ICD-10-CM

## 2012-01-17 DIAGNOSIS — Z9861 Coronary angioplasty status: Secondary | ICD-10-CM | POA: Insufficient documentation

## 2012-01-17 DIAGNOSIS — Z7901 Long term (current) use of anticoagulants: Secondary | ICD-10-CM

## 2012-01-17 DIAGNOSIS — G4700 Insomnia, unspecified: Secondary | ICD-10-CM

## 2012-01-17 DIAGNOSIS — I10 Essential (primary) hypertension: Secondary | ICD-10-CM

## 2012-01-17 DIAGNOSIS — E785 Hyperlipidemia, unspecified: Secondary | ICD-10-CM

## 2012-01-17 DIAGNOSIS — R079 Chest pain, unspecified: Principal | ICD-10-CM | POA: Insufficient documentation

## 2012-01-17 DIAGNOSIS — I236 Thrombosis of atrium, auricular appendage, and ventricle as current complications following acute myocardial infarction: Secondary | ICD-10-CM

## 2012-01-17 DIAGNOSIS — Z86718 Personal history of other venous thrombosis and embolism: Secondary | ICD-10-CM | POA: Insufficient documentation

## 2012-01-17 DIAGNOSIS — I2510 Atherosclerotic heart disease of native coronary artery without angina pectoris: Secondary | ICD-10-CM

## 2012-01-17 LAB — PROTIME-INR
INR: 2.24 — ABNORMAL HIGH (ref 0.00–1.49)
Prothrombin Time: 25.2 seconds — ABNORMAL HIGH (ref 11.6–15.2)

## 2012-01-17 LAB — CBC
Hemoglobin: 13.2 g/dL (ref 13.0–17.0)
RBC: 4.23 MIL/uL (ref 4.22–5.81)
WBC: 8 10*3/uL (ref 4.0–10.5)

## 2012-01-17 LAB — BASIC METABOLIC PANEL
CO2: 27 mEq/L (ref 19–32)
Glucose, Bld: 89 mg/dL (ref 70–99)
Potassium: 4 mEq/L (ref 3.5–5.1)
Sodium: 137 mEq/L (ref 135–145)

## 2012-01-17 LAB — POCT I-STAT TROPONIN I
Troponin i, poc: 0.02 ng/mL (ref 0.00–0.08)
Troponin i, poc: 0.1 ng/mL (ref 0.00–0.08)

## 2012-01-17 MED ORDER — WARFARIN SODIUM 5 MG PO TABS
5.0000 mg | ORAL_TABLET | Freq: Every day | ORAL | Status: DC
  Administered 2012-01-18: 5 mg via ORAL
  Filled 2012-01-17: qty 1

## 2012-01-17 MED ORDER — NITROGLYCERIN 0.4 MG SL SUBL: 0.4000 mg | SUBLINGUAL_TABLET | SUBLINGUAL | Status: DC | PRN

## 2012-01-17 MED ORDER — ZOLPIDEM TARTRATE 5 MG PO TABS: 5.0000 mg | ORAL_TABLET | Freq: Every evening | ORAL | Status: DC | PRN

## 2012-01-17 MED ORDER — ALPRAZOLAM 0.25 MG PO TABS
0.2500 mg | ORAL_TABLET | Freq: Two times a day (BID) | ORAL | Status: DC
  Administered 2012-01-18: 0.25 mg via ORAL
  Filled 2012-01-17: qty 1

## 2012-01-17 MED ORDER — MORPHINE SULFATE 2 MG/ML IJ SOLN: 1.0000 mg | INTRAMUSCULAR | Status: DC | PRN

## 2012-01-17 MED ORDER — VANCOMYCIN HCL IN DEXTROSE 1-5 GM/200ML-% IV SOLN: 1000.0000 mg | Freq: Once | INTRAVENOUS | Status: DC

## 2012-01-17 MED ORDER — PIPERACILLIN-TAZOBACTAM 3.375 G IVPB
3.3750 g | Freq: Once | INTRAVENOUS | Status: DC
  Filled 2012-01-17: qty 50

## 2012-01-17 MED ORDER — SIMVASTATIN 5 MG PO TABS
5.0000 mg | ORAL_TABLET | Freq: Every day | ORAL | Status: DC
  Administered 2012-01-18: 5 mg via ORAL
  Filled 2012-01-17: qty 1

## 2012-01-17 MED ORDER — ACETAMINOPHEN 500 MG PO TABS
1000.0000 mg | ORAL_TABLET | Freq: Four times a day (QID) | ORAL | Status: DC | PRN
  Administered 2012-01-17 – 2012-01-18 (×2): 1000 mg via ORAL
  Filled 2012-01-17 (×2): qty 2

## 2012-01-17 MED ORDER — IOHEXOL 300 MG/ML  SOLN
80.0000 mL | Freq: Once | INTRAMUSCULAR | Status: AC | PRN
  Administered 2012-01-17: 80 mL via INTRAVENOUS

## 2012-01-17 MED ORDER — PANTOPRAZOLE SODIUM 40 MG PO TBEC
40.0000 mg | DELAYED_RELEASE_TABLET | Freq: Every day | ORAL | Status: DC
  Administered 2012-01-18: 40 mg via ORAL
  Filled 2012-01-17: qty 1

## 2012-01-17 MED ORDER — CLOPIDOGREL BISULFATE 75 MG PO TABS
75.0000 mg | ORAL_TABLET | Freq: Every day | ORAL | Status: DC
  Administered 2012-01-18: 75 mg via ORAL
  Filled 2012-01-17 (×2): qty 1

## 2012-01-17 MED ORDER — METOPROLOL TARTRATE 50 MG PO TABS
50.0000 mg | ORAL_TABLET | Freq: Two times a day (BID) | ORAL | Status: DC
  Administered 2012-01-18 (×2): 50 mg via ORAL
  Filled 2012-01-17 (×3): qty 1

## 2012-01-17 MED ORDER — LISINOPRIL 5 MG PO TABS
5.0000 mg | ORAL_TABLET | Freq: Every day | ORAL | Status: DC
  Administered 2012-01-18: 5 mg via ORAL
  Filled 2012-01-17: qty 1

## 2012-01-17 NOTE — ED Notes (Signed)
C/o intermittent L sided chest pain with deep inspiration since yesterday.  Denies sob, nausea, and vomiting.

## 2012-01-17 NOTE — ED Notes (Signed)
Pt stated he was not in any pain but did have pain with inspiration and exhalation.

## 2012-01-17 NOTE — H&P (Signed)
PCP:  Georgann Housekeeper, MD, MD Chief Complaint:  Chest pain of 1 days' duration.  HPI:  Patient is a 46 year old African American male with history of coronary artery disease status post cardiac cath with stent, left ventricular thrombus on Coumadin presenting to the emergency room with chest pain of one-day duration. Patient claimed that he was at work doing physical activity and suddenly developed  chest pain located in the precordial region. He described the chest pain as achy, vague, 5/10 in intensity and nonradiating. He denied any history of shortness of breath. He denied any palpitation. He denied any diaphoresis. No nausea or vomiting. He also denies any history of cough. No fever, chills or Rigors. The chest was said to have lasted about an hour and abated. Patient had a second episode today and subsequently decided to come to the emergency room to be evaluated.  Review of Systems:  The patient denies anorexia, fever, weight loss,, vision loss, decreased hearing, hoarseness,  chest pain+, syncope, dyspnea on exertion, peripheral edema, balance deficits, hemoptysis, abdominal pain, melena, hematochezia, severe indigestion/heartburn, hematuria, incontinence, genital sores, muscle weakness, suspicious skin lesions, transient blindness, difficulty walking, depression, unusual weight change, abnormal bleeding, enlarged lymph nodes, angioedema, and breast masses.  Past Medical History:  Past Medical History  Diagnosis Date  . HTN (hypertension)   . HLD (hyperlipidemia)   . Family history of early CAD     Brother had MI at age 16 yo.  . Coronary artery disease     late presentation with ant STEMI 11/12: LHC 10/12/11: oLM 20%, LAD occluded with dissection 2/2 MI.  PCI with BMS to LAD with poor distal runoff; small caliber apical vessel c/w edematous apex from delayed presentation.  . ST elevation myocardial infarction (STEMI) of anterior wall 09/2011    s/p BMS to occluded LAD  .  Cardiomyopathy, ischemic 09/2011    EF 40-45%;  echo 10/16/11: Mild LVH, EF 45-50%, very apical akinesis, LV apical thrombus, grade 2 diastolic dysfunction, trivial MR, PASP 29.  . 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 Elevated LFTs     Abdominal ultrasound was unremarkable  . PSVT (paroxysmal supraventricular tachycardia)     s/p adenocard 12 mg IVP 10/2011    Past Surgical History  Procedure Date  . Cardiac catheterization     ss/p PCI BMS to LAD    Medications:  Prior to Admission medications   Medication Sig Start Date End Date Taking? Authorizing Provider  acetaminophen (TYLENOL) 500 MG tablet Take 1,000 mg by mouth every 6 (six) hours as needed. For pain   Yes Historical Provider, MD  clopidogrel (PLAVIX) 75 MG tablet Take 75 mg by mouth daily with breakfast.   10/21/11 10/20/12 Yes Dayna N Dunn, PA  lisinopril (PRINIVIL,ZESTRIL) 5 MG tablet Take 5 mg by mouth daily.   10/21/11 10/20/12 Yes Dayna N Dunn, PA  metoprolol (LOPRESSOR) 50 MG tablet Take 50 mg by mouth 2 (two) times daily. 11/01/11 10/31/12 Yes Gene Serpe, PA  nitroGLYCERIN (NITROSTAT) 0.4 MG SL tablet Place 0.4 mg under the tongue every 5 (five) minutes as needed. For chest pain.  10/21/11 10/20/12 Yes Dayna N Dunn, PA  pravastatin (PRAVACHOL) 80 MG tablet Take 80 mg by mouth every evening. 12/25/11 12/24/12 Yes Shawnie Pons, MD  warfarin (COUMADIN) 5 MG tablet Take 5 mg by mouth daily. Monday and wed 7.5mg , all other days 5mg  10/23/11  Yes Shawnie Pons, MD  zolpidem (AMBIEN) 5 MG tablet Take 5 mg by mouth  at bedtime as needed. For sleep 11/04/11 11/03/12 Yes Beatrice Lecher, PA    Allergies:  No Known Allergies  Social History:   reports that he has never smoked. He does not have any smokeless tobacco history on file. He reports that he drinks about .6 ounces of alcohol per week. He reports that he does not use illicit drugs.  Family History:  Family History  Problem Relation Age of Onset   . Coronary artery disease Brother     Had MI in his 27 yo    Physical Exam:  Filed Vitals:   01/17/12 1730 01/17/12 1833 01/17/12 1836 01/17/12 2148  BP: 138/94 138/84  133/78  Pulse: 95 101  95  Temp:   99 F (37.2 C)   TempSrc:   Oral   Resp: 18 20  23   SpO2: 100% 100%  100%      General: Alert and oriented times three, anxious, not in any distress, well-hydrated.  Eyes: PERRLA, pink conjunctiva, scleral anicterus  ENT: Moist oral mucosa, neck supple, no thyromegaly  Lungs: clear to ascultation, no wheeze, no crackles, no use of accessory muscles  Cardiovascular: regular rate and rhythm, tachycardic, no regurgitation, no gallops, no murmurs. No carotid bruits, no JVD  Abdomen: Soft, nontender, no organomegaly, bowel sounds are positive.  GU: not examined  Neuro: No lateralizing signs  Musculoskeletal: strength 5/5 all extremities, no clubbing, cyanosis or edema  Skin: Normal turgor  Psych: appropriate affect  ?  Labs on Admission:   [facility] 01/17/12 1749  NA 137  K 4.0  CL 101  CO2 27  GLUCOSE 89  BUN 9  CREATININE 1.04  CALCIUM 9.9  MG --  PHOS --    No results found for this basename: AST:2,ALT:2,ALKPHOS:2,BILITOT:2,PROT:2,ALBUMIN:2 in the last 72 hours  No results found for this basename: LIPASE:2,AMYLASE:2 in the last 72 hours   Basename 01/17/12 1749  WBC 8.0  NEUTROABS --  HGB 13.2  HCT 37.7*  MCV 89.1  PLT 244    No results found for this basename: CKTOTAL:3,CKMB:3,CKMBINDEX:3,TROPONINI:3 in the last 72 hours  No results found for this basename: TSH,T4TOTAL,FREET3,T3FREE,THYROIDAB in the last 72 hours  No results found for this basename: VITAMINB12:2,FOLATE:2,FERRITIN:2,TIBC:2,IRON:2,RETICCTPCT:2 in the last 72 hours  Radiological Exams on Admission:  Ct Chest W Contrast  01/17/2012  *RADIOLOGY REPORT*  Clinical Data: Chest pain.  Hypertension.  Prior myocardial infarction.  Coronary artery disease.  CT CHEST WITH CONTRAST   Technique:  Multidetector CT imaging of the chest was performed following the standard protocol during bolus administration of intravenous contrast.  Contrast: 80mL OMNIPAQUE IOHEXOL 300 MG/ML IV SOLN  Comparison: 01/17/2012  Findings: There is volume loss in the left lower lobe along with suspicion for airspace filling process, likely a combination of atelectasis and pneumonia.  We do not demonstrate a central obstructive lesion or mass.  Small mediastinal lymph nodes are within normal size limits.  There appears to be a stent in the left anterior descending coronary artery, along with an abnormal thickened appearance of the left ventricular wall along the cardiac apex.  IMPRESSION:  1.  Abnormal thickened appearance of the left ventricular cardiac apical wall, with adjacent abnormal pericardial fluid.  I note in the [facility] EPIC documentation that the patient has been diagnosed with left ventricular mural thrombus, and this could possibly account for this appearance.  A thrombosed pseudoaneurysm or luminal thrombosis in the left ventricular wall cannot be readily excluded, and echocardiography may be warranted. 2.  No  pulmonary embolus is observed. 3.  The airspace opacity left lower lobe appears primarily to be due to atelectasis, although a small amount of superimposed pneumonia is not excluded.  No central obstructing lesion as a predisposing factor for recurrent atelectasis/pneumonia is identified.  Original Report Authenticated By: Dellia Cloud, M.D.   Dg Chest Port 1 View  01/17/2012  *RADIOLOGY REPORT*  Clinical Data: Intermittent chest pain.  PORTABLE CHEST - 1 VIEW  Comparison: 10/31/2011  Findings: Recurrent left lower lobe airspace opacity is observed, suspicious for pneumonia.  The lungs appear otherwise clear. Cardiac and mediastinal contours appear unremarkable.  IMPRESSION:  1.  Recurrent left lower lobe airspace opacity, suspicious for pneumonia.  Follow-up to clearance is  recommended.  Given the recurrence of pneumonia, if clinically warranted chest CT could be utilized to assess for concurrent underlying pathologic process.  Original Report Authenticated By: Dellia Cloud, M.D.    Assessment/Plan  Present on Admission:   Problems: #1 chest pain-no associated shortness of breath or cough. No systemic symptoms. #2 anxiety.  Impression: #1 chest pain rule out ACS #2 history of coronary artery disease status post cardiac cath with stent #3 anxiety state #4 history of left ventricular thrombus on anticoagulation with Coumadin #5 hypertension #6 hyperlipidemia  Plan: #1 admit patient to telemetry #2 control patient's anxiety with Xanax #3 we give IV morphine when necessary and also nitroglycerin #4 restart home meds #5 GI prophylaxis with Protonix #6 labs; and enzyme every 8x3, CBC, CMP and magnesium repeated in a.m. #7 imaging-2-D echo Patient will be evaluated on daily basis             Talmage Nap                      [phone]

## 2012-01-17 NOTE — ED Provider Notes (Addendum)
History     CSN: [id number]  Arrival date & time 01/17/12  1610   First MD Initiated Contact with Patient 01/17/12 1725      Chief Complaint  Patient presents with  . Chest Pain    (Consider location/radiation/quality/duration/timing/severity/associated sxs/prior treatment) Patient is a 46 y.o. male presenting with chest pain. The history is provided by the patient. No language interpreter was used.  Chest Pain The chest pain began 12 - 24 hours ago. Chest pain occurs intermittently. The chest pain is worsening. At its most intense, the pain is at 4/10. The pain is currently at 0/10. The severity of the pain is mild. The quality of the pain is described as aching, dull and heavy. The pain does not radiate. Chest pain is worsened by deep breathing. Pertinent negatives for primary symptoms include no fever, no syncope, no shortness of breath, no cough, no wheezing, no palpitations, no abdominal pain, no nausea, no vomiting, no dizziness and no altered mental status.  Pertinent negatives for associated symptoms include no claudication, no diaphoresis, no lower extremity edema, no near-syncope, no numbness, no orthopnea, no paroxysmal nocturnal dyspnea and no weakness. He tried nothing for the symptoms. Risk factors include male gender.  His past medical history is significant for arrhythmia, CAD, hyperlipidemia, hypertension and MI.  Pertinent negatives for past medical history include no aneurysm, no anxiety/panic attacks, no COPD, no CHF, no diabetes, no DVT, no mitral valve prolapse, no pacemaker, no PE, no rheumatic fever, no seizures, no sleep apnea, no strokes, no TIA and no valve disorder.    patient reports chest pain that started yesterday around 5 AM while he was at work. States the pain is located below his left nipple and it lasts around 30 minutes. The pain went away and came back around 7:30 AM and lasted 2 hours he then took Tylenol and it subsided. The pain returned one more time  for 2 hours and subsided around 11 PM. This morning around 7 AM the pain returned a dual pain especially with a deep breath. Denies shortness of breath nausea or diaphoresis. States that in December he had an MI and has a stent in his heart. States his brother had a heart attack in his 79s. He is presently pain-free. Dr. Tedra Senegal is his heart doctor. Dr. Eula Listen Mrs. PCP. Patient was admitted in December 2012 for SVT and was found to have a left ventricular thrombus. At that time he was And did get a cardiac stent. Patient is on Coumadin and Plavix. He is tachycardic presently. Past medical history of hypertension hyperlipidemia as well.  Past Medical History  Diagnosis Date  . HTN (hypertension)   . HLD (hyperlipidemia)   . Family history of early CAD     Brother had MI at age 104 yo.  . Coronary artery disease     late presentation with ant STEMI 11/12: LHC 10/12/11: oLM 20%, LAD occluded with dissection 2/2 MI.  PCI with BMS to LAD with poor distal runoff; small caliber apical vessel c/w edematous apex from delayed presentation.  . ST elevation myocardial infarction (STEMI) of anterior wall 09/2011    s/p BMS to occluded LAD  . Cardiomyopathy, ischemic 09/2011    EF 40-45%;  echo 10/16/11: Mild LVH, EF 45-50%, very apical akinesis, LV apical thrombus, grade 2 diastolic dysfunction, trivial MR, PASP 29.  . 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 Elevated LFTs     Abdominal ultrasound was unremarkable  . PSVT (  paroxysmal supraventricular tachycardia)     s/p adenocard 12 mg IVP 10/2011    Past Surgical History  Procedure Date  . Cardiac catheterization     ss/p PCI BMS to LAD    Family History  Problem Relation Age of Onset  . Coronary artery disease Brother     Had MI in his 69 yo    History  Substance Use Topics  . Smoking status: Never Smoker   . Smokeless tobacco: Not on file  . Alcohol Use: 0.6 oz/week    1 Cans of beer per week      Review of  Systems  Constitutional: Negative for fever and diaphoresis.  Respiratory: Negative for cough, shortness of breath and wheezing.   Cardiovascular: Positive for chest pain. Negative for palpitations, orthopnea, claudication, syncope and near-syncope.  Gastrointestinal: Negative for nausea, vomiting and abdominal pain.  Neurological: Negative for dizziness, seizures, weakness and numbness.  Psychiatric/Behavioral: Negative for altered mental status.    Allergies  Review of patient's allergies indicates no known allergies.  Home Medications   Current Outpatient Rx  Name Route Sig Dispense Refill  . ACETAMINOPHEN 500 MG PO TABS Oral Take 1,000 mg by mouth every 6 (six) hours as needed. For pain    . CLOPIDOGREL BISULFATE 75 MG PO TABS Oral Take 75 mg by mouth daily with breakfast.      . LISINOPRIL 5 MG PO TABS Oral Take 5 mg by mouth daily.      [name] METOPROLOL TARTRATE 50 MG PO TABS Oral Take 50 mg by mouth 2 (two) times daily.    [name] NITROGLYCERIN 0.4 MG SL SUBL Sublingual Place 0.4 mg under the tongue every 5 (five) minutes as needed. For chest pain.     [name] PRAVASTATIN SODIUM 80 MG PO TABS Oral Take 80 mg by mouth every evening.    . WARFARIN SODIUM 5 MG PO TABS Oral Take 5 mg by mouth daily. Monday and wed 7.5mg , all other days 5mg     . ZOLPIDEM TARTRATE 5 MG PO TABS Oral Take 5 mg by mouth at bedtime as needed. For sleep      BP 138/84  Pulse 101  Temp(Src) 99 F (37.2 C) (Oral)  Resp 20  SpO2 100%  Physical Exam  Nursing note and vitals reviewed. Constitutional: He is oriented to person, place, and time. He appears well-developed and well-nourished.  HENT:  Head: Normocephalic.  Eyes: Conjunctivae and EOM are normal. Pupils are equal, round, and reactive to light.  Neck: Normal range of motion. Neck supple.  Cardiovascular: Normal rate, regular rhythm and intact distal pulses.  Exam reveals no gallop and no friction rub.   No murmur heard. Pulmonary/Chest: Effort normal. No  respiratory distress.  Abdominal: Soft.  Musculoskeletal: Normal range of motion.  Neurological: He is alert and oriented to person, place, and time.  Skin: Skin is warm and dry.  Psychiatric: He has a normal mood and affect.    ED Course  Procedures (including critical care time)  Labs Reviewed  CBC - Abnormal; Notable for the following:    HCT 37.7 (*)    All other components within normal limits  POCT I-STAT TROPONIN I  BASIC METABOLIC PANEL  PROTIME-INR   No results found.   No diagnosis found.    MDM   46 year old male coming in with left chest pain x24 hours term meds and lasting an hour to 2 hours at a time. Pain is nonradiating and dull. Chest x-ray and CT  suggests left lower lobe pneumonia. Patient was recently in the hospital we will treat for hospital-acquired pneumonia with Vanc and Zosyn. His second troponin was mildly elevated 0.10 doubt this is cardiac in nature. Patient is on Coumadin and Plavix for his stent that was placed in November 2012. His PCP is Dr. Eula Listen. Discuss with Dr. Freida Busman we will get hospitalist to admit for chest pain / pneumonia.  1930 Chest x-ray recomends that we get a CT of the chest to rule out pneumonia in the left lower lobe. We will proceed with a CT of the chest.    Date: 01/17/2012  Rate: 94  Rhythm: normal sinus rhythm  QRS Axis: normal  Intervals: normal  ST/T Wave abnormalities: normal  Conduction Disutrbances:none  Narrative Interpretation:   Old EKG Reviewed: unchanged   Labs Reviewed  CBC - Abnormal; Notable for the following:    HCT 37.7 (*)    All other components within normal limits  BASIC METABOLIC PANEL - Abnormal; Notable for the following:    GFR calc non Af Amer 86 (*)    All other components within normal limits  PROTIME-INR - Abnormal; Notable for the following:    Prothrombin Time 25.2 (*)    INR 2.24 (*)    All other components within normal limits  POCT I-STAT TROPONIN I - Abnormal; Notable for the  following:    Troponin i, poc 0.10 (*)    All other components within normal limits  POCT I-STAT TROPONIN I           Jethro Bastos, NP 01/17/12 2224  Jethro Bastos, NP 02/02/12 1433  Jethro Bastos, NP 02/26/12 1436

## 2012-01-17 NOTE — ED Provider Notes (Signed)
Medical screening examination/treatment/procedure(s) were conducted as a shared visit with non-physician practitioner(s) and myself.  I personally evaluated the patient during the encounter  Pt seen and examined, doubt acs, suspect pna, awaiting imaging  Toy Baker, MD 01/17/12 [phone]

## 2012-01-18 ENCOUNTER — Other Ambulatory Visit: Payer: Self-pay

## 2012-01-18 DIAGNOSIS — I369 Nonrheumatic tricuspid valve disorder, unspecified: Secondary | ICD-10-CM

## 2012-01-18 DIAGNOSIS — R0789 Other chest pain: Secondary | ICD-10-CM | POA: Diagnosis present

## 2012-01-18 LAB — COMPREHENSIVE METABOLIC PANEL
Alkaline Phosphatase: 78 U/L (ref 39–117)
BUN: 9 mg/dL (ref 6–23)
Chloride: 106 mEq/L (ref 96–112)
GFR calc Af Amer: >90 mL/min (ref >90)
GFR calc non Af Amer: >90 mL/min (ref >90)
Glucose, Bld: 104 mg/dL — ABNORMAL HIGH (ref 70–99)
Potassium: 4.2 mEq/L (ref 3.5–5.1)
Total Bilirubin: 0.6 mg/dL (ref 0.3–1.2)

## 2012-01-18 LAB — CARDIAC PANEL(CRET KIN+CKTOT+MB+TROPI)
CK, MB: 1.7 ng/mL (ref 0.3–4.0)
Relative Index: 1.3 (ref 0.0–2.5)
Relative Index: 1.4 (ref 0.0–2.5)
Total CK: 132 U/L (ref 7–232)
Total CK: 132 U/L (ref 7–232)

## 2012-01-18 LAB — CBC
HCT: 39 % (ref 39.0–52.0)
Hemoglobin: 13 g/dL (ref 13.0–17.0)
MCHC: 33.3 g/dL (ref 30.0–36.0)
WBC: 5.5 10*3/uL (ref 4.0–10.5)

## 2012-01-18 LAB — PROTIME-INR: INR: 2.41 — ABNORMAL HIGH (ref 0.00–1.49)

## 2012-01-18 LAB — DIFFERENTIAL
Basophils Absolute: 0 10*3/uL (ref 0.0–0.1)
Eosinophils Absolute: 0 10*3/uL (ref 0.0–0.7)
Eosinophils Relative: 0 % (ref 0–5)

## 2012-01-18 LAB — MAGNESIUM: Magnesium: 2.3 mg/dL (ref 1.5–2.5)

## 2012-01-18 NOTE — Progress Notes (Signed)
Subjective: Patient had chest pain Recent cold Pain worse with deep breathing   Objective: Vital signs in last 24 hours: Filed Vitals:   01/17/12 2254 01/18/12 0224 01/18/12 0500 01/18/12 1046  BP: 133/86 133/86 124/79 119/77  Pulse: 100 100 79 91  Temp: 99.4 F (37.4 C) 99.4 F (37.4 C) 98.3 F (36.8 C)   TempSrc: Oral  Oral   Resp: 20 20 19    Height: 5\' 7"  (1.702 m)     Weight: 74.39 kg (164 lb)     SpO2: 100% 100% 100%    Weight change:   Intake/Output Summary (Last 24 hours) at 01/18/12 1316 Last data filed at 01/18/12 0500  Gross per 24 hour  Intake      0 ml  Output    700 ml  Net   -700 ml    Physical Exam: General: Awake, Oriented, No acute distress. HEENT: EOMI. Neck: Supple CV: S1 and S2, chest pain reproducible on exam of musculoskeletal system  Lungs: Clear to ascultation bilaterally, no wheezing Abdomen: Soft, Nontender, Nondistended, +bowel sounds. Ext: Good pulses. No edema.   Lab Results:  [facility] 01/18/12 0836 01/17/12 1749  NA 141 137  K 4.2 4.0  CL 106 101  CO2 29 27  GLUCOSE 104* 89  BUN 9 9  CREATININE 0.94 1.04  CALCIUM 9.6 9.9  MG 2.3 --  PHOS -- --    Basename 01/18/12 0836  AST 17  ALT 14  ALKPHOS 78  BILITOT 0.6  PROT 6.8  ALBUMIN 3.5   No results found for this basename: LIPASE:2,AMYLASE:2 in the last 72 hours  Basename 01/18/12 0836 01/17/12 1749  WBC 5.5 8.0  NEUTROABS 3.4 --  HGB 13.0 13.2  HCT 39.0 37.7*  MCV 90.3 89.1  PLT 241 244    Basename 01/18/12 0836 01/17/12 2326  CKTOTAL 130 132  CKMB 1.8 1.7  CKMBINDEX -- --  TROPONINI <0.30 <0.30   No components found with this basename: POCBNP:3 No results found for this basename: DDIMER:2 in the last 72 hours No results found for this basename: HGBA1C:2 in the last 72 hours No results found for this basename: CHOL:2,HDL:2,LDLCALC:2,TRIG:2,CHOLHDL:2,LDLDIRECT:2 in the last 72 hours No results found for this basename: TSH,T4TOTAL,FREET3,T3FREE,THYROIDAB in  the last 72 hours No results found for this basename: VITAMINB12:2,FOLATE:2,FERRITIN:2,TIBC:2,IRON:2,RETICCTPCT:2 in the last 72 hours  Micro Results: No results found for this or any previous visit (from the past 240 hour(s)).  Studies/Results: Ct Chest W Contrast  01/17/2012  *RADIOLOGY REPORT*  Clinical Data: Chest pain.  Hypertension.  Prior myocardial infarction.  Coronary artery disease.  CT CHEST WITH CONTRAST  Technique:  Multidetector CT imaging of the chest was performed following the standard protocol during bolus administration of intravenous contrast.  Contrast: 80mL OMNIPAQUE IOHEXOL 300 MG/ML IV SOLN  Comparison: 01/17/2012  Findings: There is volume loss in the left lower lobe along with suspicion for airspace filling process, likely a combination of atelectasis and pneumonia.  We do not demonstrate a central obstructive lesion or mass.  Small mediastinal lymph nodes are within normal size limits.  There appears to be a stent in the left anterior descending coronary artery, along with an abnormal thickened appearance of the left ventricular wall along the cardiac apex.  IMPRESSION:  1.  Abnormal thickened appearance of the left ventricular cardiac apical wall, with adjacent abnormal pericardial fluid.  I note in the [facility] EPIC documentation that the patient has been diagnosed with left ventricular mural thrombus, and this could possibly  account for this appearance.  A thrombosed pseudoaneurysm or luminal thrombosis in the left ventricular wall cannot be readily excluded, and echocardiography may be warranted. 2.  No pulmonary embolus is observed. 3.  The airspace opacity left lower lobe appears primarily to be due to atelectasis, although a small amount of superimposed pneumonia is not excluded.  No central obstructing lesion as a predisposing factor for recurrent atelectasis/pneumonia is identified.  Original Report Authenticated By: Dellia Cloud, M.D.   Dg Chest Port 1  View  01/17/2012  *RADIOLOGY REPORT*  Clinical Data: Intermittent chest pain.  PORTABLE CHEST - 1 VIEW  Comparison: 10/31/2011  Findings: Recurrent left lower lobe airspace opacity is observed, suspicious for pneumonia.  The lungs appear otherwise clear. Cardiac and mediastinal contours appear unremarkable.  IMPRESSION:  1.  Recurrent left lower lobe airspace opacity, suspicious for pneumonia.  Follow-up to clearance is recommended.  Given the recurrence of pneumonia, if clinically warranted chest CT could be utilized to assess for concurrent underlying pathologic process.  Original Report Authenticated By: Dellia Cloud, M.D.    Medications: I have reviewed the patient's current medications. Scheduled Meds:   . ALPRAZolam  0.25 mg Oral BID  . clopidogrel  75 mg Oral Q breakfast  . lisinopril  5 mg Oral Daily  . metoprolol  50 mg Oral BID  . pantoprazole  40 mg Oral Q1200  . simvastatin  5 mg Oral q1800  . warfarin  5 mg Oral q1800  . DISCONTD: piperacillin-tazobactam (ZOSYN)  IV  3.375 g Intravenous Once  . DISCONTD: vancomycin  1,000 mg Intravenous Once   Continuous Infusions:  PRN Meds:.acetaminophen, iohexol, morphine injection, nitroGLYCERIN, zolpidem  Assessment/Plan: Chest pain- atypical- CE negative so far.  EKG ok, echo pending H/o LV thrombus on coumadin HTN HLD Anxiety- xanax  Plan to D/C if echo ok    LOS: 1 day  Rodney Marcella, DO 01/18/2012, 1:16 PM

## 2012-01-18 NOTE — Progress Notes (Signed)
  Echocardiogram 2D Echocardiogram has been performed.  Rodney Marquez 01/18/2012, 1:29 PM

## 2012-01-18 NOTE — ED Provider Notes (Signed)
Medical screening examination/treatment/procedure(s) were conducted as a shared visit with non-physician practitioner(s) and myself.  I personally evaluated the patient during the encounter  Rodney Cranford T Fountain Derusha, MD 01/18/12 2234 

## 2012-01-18 NOTE — Progress Notes (Signed)
Utilization review complete 

## 2012-01-18 NOTE — Progress Notes (Signed)
Patient d/c home with s/o. Ambulatory at d/c with no distress noted.

## 2012-01-18 NOTE — Discharge Summary (Signed)
Discharge Summary  REVANTH NEIDIG MR#: [id number]  DOB:October 07, 1966  Date of Admission: 01/17/2012 Date of Discharge: 01/18/2012  Patient's PCP: Georgann Housekeeper, MD, MD  Attending Physician:Zaylen Susman   Discharge Diagnoses: Active Problems:  Hypertension  LV (left ventricular) mural thrombus following MI  Atypical chest pain   Brief Admitting History and Physical Patient is a 46 year old African American male with history of coronary artery disease status post cardiac cath with stent, left ventricular thrombus on Coumadin presenting to the emergency room with chest pain of one-day duration. Patient claimed that he was at work doing physical activity and suddenly developed chest pain located in the precordial region. He described the chest pain as achy, vague, 5/10 in intensity and nonradiating. He denied any history of shortness of breath. He denied any palpitation. He denied any diaphoresis. No nausea or vomiting. He also denies any history of cough. No fever, chills or Rigors. The chest was said to have lasted about an hour and abated. Patient had a second episode today and subsequently decided to come to the emergency room to be evaluated.   Discharge Medications Medication List  As of 01/18/2012  6:15 PM   ASK your doctor about these medications         acetaminophen 500 MG tablet   Commonly known as: TYLENOL   Take 1,000 mg by mouth every 6 (six) hours as needed. For pain      clopidogrel 75 MG tablet   Commonly known as: PLAVIX   Take 75 mg by mouth daily with breakfast.      lisinopril 5 MG tablet   Commonly known as: PRINIVIL,ZESTRIL   Take 5 mg by mouth daily.      metoprolol 50 MG tablet   Commonly known as: LOPRESSOR   Take 50 mg by mouth 2 (two) times daily.      nitroGLYCERIN 0.4 MG SL tablet   Commonly known as: NITROSTAT   Place 0.4 mg under the tongue every 5 (five) minutes as needed. For chest pain.      pravastatin 80 MG tablet   Commonly known as:  PRAVACHOL   Take 80 mg by mouth every evening.      warfarin 5 MG tablet   Commonly known as: COUMADIN   Take 5 mg by mouth daily. Monday and wed 7.5mg , all other days 5mg       zolpidem 5 MG tablet   Commonly known as: AMBIEN   Take 5 mg by mouth at bedtime as needed. For sleep            Hospital Course:  .Atypical chest pain- pleuretic and muskculoskeletal, CE negative, echo ok,  .Hypertension- stable .LV (left ventricular) mural thrombus following MI- coumadin   Day of Discharge BP 110/69  Pulse 75  Temp(Src) 97.3 F (36.3 C) (Oral)  Resp 20  Ht 5\' 7"  (1.702 m)  Wt 74.39 kg (164 lb)  BMI 25.69 kg/m2  SpO2 99%  Results for orders placed during the hospital encounter of 01/17/12 (from the past 48 hour(s))  CBC     Status: Abnormal   Collection Time   01/17/12  5:49 PM      Component Value Range Comment   WBC 8.0  4.0 - 10.5 (K/uL)    RBC 4.23  4.22 - 5.81 (MIL/uL)    Hemoglobin 13.2  13.0 - 17.0 (g/dL)    HCT 44.0 (*) 34.7 - 52.0 (%)    MCV 89.1  78.0 - 100.0 (fL)  MCH 31.2  26.0 - 34.0 (pg)    MCHC 35.0  30.0 - 36.0 (g/dL)    RDW 16.1  09.6 - 04.5 (%)    Platelets 244  150 - 400 (K/uL)   BASIC METABOLIC PANEL     Status: Abnormal   Collection Time   01/17/12  5:49 PM      Component Value Range Comment   Sodium 137  135 - 145 (mEq/L)    Potassium 4.0  3.5 - 5.1 (mEq/L)    Chloride 101  96 - 112 (mEq/L)    CO2 27  19 - 32 (mEq/L)    Glucose, Bld 89  70 - 99 (mg/dL)    BUN 9  6 - 23 (mg/dL)    Creatinine, Ser 4.09  0.50 - 1.35 (mg/dL)    Calcium 9.9  8.4 - 10.5 (mg/dL)    GFR calc non Af Amer 86 (*) >90 (mL/min)    GFR calc Af Amer >90  >90 (mL/min)   PROTIME-INR     Status: Abnormal   Collection Time   01/17/12  5:49 PM      Component Value Range Comment   Prothrombin Time 25.2 (*) 11.6 - 15.2 (seconds)    INR 2.24 (*) 0.00 - 1.49    POCT I-STAT TROPONIN I     Status: Normal   Collection Time   01/17/12  6:15 PM      Component Value Range Comment     Troponin i, poc 0.02  0.00 - 0.08 (ng/mL)    Comment 3            POCT I-STAT TROPONIN I     Status: Abnormal   Collection Time   01/17/12  9:03 PM      Component Value Range Comment   Troponin i, poc 0.10 (*) 0.00 - 0.08 (ng/mL)    Comment NOTIFIED PHYSICIAN      Comment 3            CARDIAC PANEL(CRET KIN+CKTOT+MB+TROPI)     Status: Normal   Collection Time   01/17/12 11:26 PM      Component Value Range Comment   Total CK 132  7 - 232 (U/L)    CK, MB 1.7  0.3 - 4.0 (ng/mL)    Troponin I <0.30  <0.30 (ng/mL)    Relative Index 1.3  0.0 - 2.5    PROTIME-INR     Status: Abnormal   Collection Time   01/18/12  8:36 AM      Component Value Range Comment   Prothrombin Time 26.6 (*) 11.6 - 15.2 (seconds)    INR 2.41 (*) 0.00 - 1.49    CARDIAC PANEL(CRET KIN+CKTOT+MB+TROPI)     Status: Normal   Collection Time   01/18/12  8:36 AM      Component Value Range Comment   Total CK 130  7 - 232 (U/L)    CK, MB 1.8  0.3 - 4.0 (ng/mL)    Troponin I <0.30  <0.30 (ng/mL)    Relative Index 1.4  0.0 - 2.5    CBC     Status: Normal   Collection Time   01/18/12  8:36 AM      Component Value Range Comment   WBC 5.5  4.0 - 10.5 (K/uL)    RBC 4.32  4.22 - 5.81 (MIL/uL)    Hemoglobin 13.0  13.0 - 17.0 (g/dL)    HCT 81.1  91.4 - 78.2 (%)  MCV 90.3  78.0 - 100.0 (fL)    MCH 30.1  26.0 - 34.0 (pg)    MCHC 33.3  30.0 - 36.0 (g/dL)    RDW 16.1  09.6 - 04.5 (%)    Platelets 241  150 - 400 (K/uL)   COMPREHENSIVE METABOLIC PANEL     Status: Abnormal   Collection Time   01/18/12  8:36 AM      Component Value Range Comment   Sodium 141  135 - 145 (mEq/L)    Potassium 4.2  3.5 - 5.1 (mEq/L)    Chloride 106  96 - 112 (mEq/L)    CO2 29  19 - 32 (mEq/L)    Glucose, Bld 104 (*) 70 - 99 (mg/dL)    BUN 9  6 - 23 (mg/dL)    Creatinine, Ser 4.09  0.50 - 1.35 (mg/dL)    Calcium 9.6  8.4 - 10.5 (mg/dL)    Total Protein 6.8  6.0 - 8.3 (g/dL)    Albumin 3.5  3.5 - 5.2 (g/dL)    AST 17  0 - 37 (U/L)    ALT 14   0 - 53 (U/L)    Alkaline Phosphatase 78  39 - 117 (U/L)    Total Bilirubin 0.6  0.3 - 1.2 (mg/dL)    GFR calc non Af Amer >90  >90 (mL/min)    GFR calc Af Amer >90  >90 (mL/min)   MAGNESIUM     Status: Normal   Collection Time   01/18/12  8:36 AM      Component Value Range Comment   Magnesium 2.3  1.5 - 2.5 (mg/dL)   DIFFERENTIAL     Status: Normal   Collection Time   01/18/12  8:36 AM      Component Value Range Comment   Neutrophils Relative 61  43 - 77 (%)    Neutro Abs 3.4  1.7 - 7.7 (K/uL)    Lymphocytes Relative 30  12 - 46 (%)    Lymphs Abs 1.6  0.7 - 4.0 (K/uL)    Monocytes Relative 9  3 - 12 (%)    Monocytes Absolute 0.5  0.1 - 1.0 (K/uL)    Eosinophils Relative 0  0 - 5 (%)    Eosinophils Absolute 0.0  0.0 - 0.7 (K/uL)    Basophils Relative 0  0 - 1 (%)    Basophils Absolute 0.0  0.0 - 0.1 (K/uL)   CARDIAC PANEL(CRET KIN+CKTOT+MB+TROPI)     Status: Normal   Collection Time   01/18/12  3:35 PM      Component Value Range Comment   Total CK 132  7 - 232 (U/L)    CK, MB 1.8  0.3 - 4.0 (ng/mL)    Troponin I <0.30  <0.30 (ng/mL)    Relative Index 1.4  0.0 - 2.5      Ct Chest W Contrast  01/17/2012  *RADIOLOGY REPORT*  Clinical Data: Chest pain.  Hypertension.  Prior myocardial infarction.  Coronary artery disease.  CT CHEST WITH CONTRAST  Technique:  Multidetector CT imaging of the chest was performed following the standard protocol during bolus administration of intravenous contrast.  Contrast: 80mL OMNIPAQUE IOHEXOL 300 MG/ML IV SOLN  Comparison: 01/17/2012  Findings: There is volume loss in the left lower lobe along with suspicion for airspace filling process, likely a combination of atelectasis and pneumonia.  We do not demonstrate a central obstructive lesion or mass.  Small mediastinal lymph nodes are within normal  size limits.  There appears to be a stent in the left anterior descending coronary artery, along with an abnormal thickened appearance of the left ventricular  wall along the cardiac apex.  IMPRESSION:  1.  Abnormal thickened appearance of the left ventricular cardiac apical wall, with adjacent abnormal pericardial fluid.  I note in the [facility] EPIC documentation that the patient has been diagnosed with left ventricular mural thrombus, and this could possibly account for this appearance.  A thrombosed pseudoaneurysm or luminal thrombosis in the left ventricular wall cannot be readily excluded, and echocardiography may be warranted. 2.  No pulmonary embolus is observed. 3.  The airspace opacity left lower lobe appears primarily to be due to atelectasis, although a small amount of superimposed pneumonia is not excluded.  No central obstructing lesion as a predisposing factor for recurrent atelectasis/pneumonia is identified.  Original Report Authenticated By: Dellia Cloud, M.D.   Dg Chest Port 1 View  01/17/2012  *RADIOLOGY REPORT*  Clinical Data: Intermittent chest pain.  PORTABLE CHEST - 1 VIEW  Comparison: 10/31/2011  Findings: Recurrent left lower lobe airspace opacity is observed, suspicious for pneumonia.  The lungs appear otherwise clear. Cardiac and mediastinal contours appear unremarkable.  IMPRESSION:  1.  Recurrent left lower lobe airspace opacity, suspicious for pneumonia.  Follow-up to clearance is recommended.  Given the recurrence of pneumonia, if clinically warranted chest CT could be utilized to assess for concurrent underlying pathologic process.  Original Report Authenticated By: Dellia Cloud, M.D.   ECHO 01/18/12 Study Conclusions  - Left ventricle: The cavity size was normal. Wall thickness was normal. Systolic function was normal. The estimated ejection fraction was in the range of 60% to 65%. There is akinesis of the apical myocardium. - Mitral valve: Mild regurgitation. - Tricuspid valve: Moderate regurgitation. Transthoracic echocardiography. M-mode, complete 2D, spectral Doppler, and color Doppler. Height:  Height: 170.2cm. Height: 67in. Weight: Weight: 74.4kg. Weight: 163.7lb. Body mass index: BMI: 25.7kg/m^2. Body surface area: BSA: 1.58m^2. Blood pressure: 119/77. Patient status: Inpatient. Location: Bedside.      Disposition: home  Diet: cardiac  Activity:as tolerated   Follow-up Appts: Discharge Orders    Future Appointments: Provider: Department: Dept Phone: Center:   01/25/2012 10:20 AM [facility] [facility] [phone] [facility]   01/28/2012 10:20 AM [facility] [phone] None   01/28/2012 10:30 AM [facility]  None   01/28/2012 11:30 AM Shawnie Pons, MD [facility] [phone] [facility]       Time spent on discharge, talking to the patient, and coordinating care: 34 mins.   SignedMarlin Canary, DO 01/18/2012, 6:15 PM

## 2012-01-19 NOTE — Progress Notes (Signed)
   CARE MANAGEMENT NOTE 01/19/2012  Patient:  Rodney Marquez, Rodney Marquez   Account Number:  [id number]  Date Initiated:  01/19/2012  Documentation initiated by:  Donn Pierini  Subjective/Objective Assessment:   Pt admitted with chest pain     Action/Plan:   PTA pt lived at home alone, was independent with ADLs   Anticipated DC Date:  01/18/2012   Anticipated DC Plan:  HOME/SELF CARE      DC Planning Services  CM consult      Choice offered to / List presented to:             Status of service:  Completed, signed off Medicare Important Message given?   (If response is "NO", the following Medicare IM given date fields will be blank) Date Medicare IM given:   Date Additional Medicare IM given:    Discharge Disposition:  HOME/SELF CARE  Per UR Regulation:  Reviewed for med. necessity/level of care/duration of stay  Comments:  PCP- Husain  01/19/12 0940 Donn Pierini RN BSN [phone] Pt discharged home with no discharge needs

## 2012-01-21 ENCOUNTER — Other Ambulatory Visit: Payer: BC Managed Care – PPO | Admitting: *Deleted

## 2012-01-25 ENCOUNTER — Other Ambulatory Visit (INDEPENDENT_AMBULATORY_CARE_PROVIDER_SITE_OTHER): Payer: BC Managed Care – PPO

## 2012-01-25 DIAGNOSIS — E785 Hyperlipidemia, unspecified: Secondary | ICD-10-CM

## 2012-01-25 LAB — HEPATIC FUNCTION PANEL
ALT: 19 U/L (ref 0–53)
AST: 20 U/L (ref 0–37)
Albumin: 3.8 g/dL (ref 3.5–5.2)
Total Bilirubin: 0.4 mg/dL (ref 0.3–1.2)

## 2012-01-25 LAB — LIPID PANEL
Cholesterol: 108 mg/dL (ref 0–200)
LDL Cholesterol: 54 mg/dL (ref 0–99)

## 2012-01-28 ENCOUNTER — Encounter: Payer: Self-pay | Admitting: Cardiology

## 2012-01-28 ENCOUNTER — Ambulatory Visit (INDEPENDENT_AMBULATORY_CARE_PROVIDER_SITE_OTHER): Payer: BC Managed Care – PPO | Admitting: Cardiology

## 2012-01-28 ENCOUNTER — Ambulatory Visit (INDEPENDENT_AMBULATORY_CARE_PROVIDER_SITE_OTHER): Payer: BC Managed Care – PPO | Admitting: *Deleted

## 2012-01-28 ENCOUNTER — Ambulatory Visit (HOSPITAL_COMMUNITY): Payer: BC Managed Care – PPO

## 2012-01-28 DIAGNOSIS — E785 Hyperlipidemia, unspecified: Secondary | ICD-10-CM

## 2012-01-28 DIAGNOSIS — Z7901 Long term (current) use of anticoagulants: Secondary | ICD-10-CM

## 2012-01-28 DIAGNOSIS — I238 Other current complications following acute myocardial infarction: Secondary | ICD-10-CM

## 2012-01-28 DIAGNOSIS — I2510 Atherosclerotic heart disease of native coronary artery without angina pectoris: Secondary | ICD-10-CM

## 2012-01-28 DIAGNOSIS — I236 Thrombosis of atrium, auricular appendage, and ventricle as current complications following acute myocardial infarction: Secondary | ICD-10-CM

## 2012-01-28 DIAGNOSIS — I10 Essential (primary) hypertension: Secondary | ICD-10-CM

## 2012-01-28 MED ORDER — LISINOPRIL 10 MG PO TABS: 10.0000 mg | ORAL_TABLET | Freq: Every day | ORAL | Status: DC

## 2012-01-28 NOTE — Assessment & Plan Note (Signed)
Has had four months of warfarin, and no visible or mobile thrombus.  So at four months, we stop warfarin, and start ASA in its place.  Reviewed echo with patient in office.

## 2012-01-28 NOTE — Assessment & Plan Note (Signed)
Seems stable.  Symptoms are better.  Will repeat his CXR in 4 weeks given current findings.

## 2012-01-28 NOTE — Patient Instructions (Signed)
Your physician has requested that you regularly monitor and record your blood pressure readings at home. Please use the same machine at the same time of day to check your readings and record them to bring to your follow-up visit.  Your physician has recommended you make the following change in your medication: STOP Coumadin in 2 weeks (02/11/12), Please start Aspirin 81mg  once a day on 02/11/12, INCREASE Lisinopril to 10mg  take one by mouth daily  Your physician recommends that you schedule a follow-up appointment in: 4 WEEKS  Dr Riley Kill would like you to have a chest x-ray performed in 4 weeks (prior to your appointment with Dr Riley Kill).  This can be done at the [place] office on [facility]

## 2012-01-28 NOTE — Assessment & Plan Note (Signed)
Slight elevation.  Encourage home cuff.  Will increase Lisinopril to 10mg  per day.

## 2012-01-28 NOTE — Assessment & Plan Note (Signed)
Near target.  

## 2012-01-28 NOTE — Progress Notes (Signed)
HPI:  He is doing well the past few days.  He was admitted to the hospital about two weeks ago with an episode of chest pain.  He was kept overnight, and had a CT which showed a LLL density, and questioned a pseudoanuerysm.  There was an echo done which demonstrated good LV function.  See report.  He feels better, but is reluctant to exercise.  There was no described LV mural thrombus.  Stable at present.    Current Outpatient Prescriptions  Medication Sig Dispense Refill  . acetaminophen (TYLENOL) 500 MG tablet Take 1,000 mg by mouth every 6 (six) hours as needed. For pain      . clopidogrel (PLAVIX) 75 MG tablet Take 75 mg by mouth daily with breakfast.        . lisinopril (PRINIVIL,ZESTRIL) 10 MG tablet Take 1 tablet (10 mg total) by mouth daily.  30 tablet  6  . metoprolol (LOPRESSOR) 50 MG tablet Take 50 mg by mouth 2 (two) times daily.      . nitroGLYCERIN (NITROSTAT) 0.4 MG SL tablet Place 0.4 mg under the tongue every 5 (five) minutes as needed. For chest pain.       . pravastatin (PRAVACHOL) 80 MG tablet Take 80 mg by mouth every evening.      . warfarin (COUMADIN) 5 MG tablet Take 5 mg by mouth daily. Monday and wed 7.5mg , all other days 5mg       . zolpidem (AMBIEN) 5 MG tablet Take 5 mg by mouth at bedtime as needed. For sleep        No Known Allergies  Past Medical History  Diagnosis Date  . HTN (hypertension)   . HLD (hyperlipidemia)   . Family history of early CAD     Brother had MI at age 1 yo.  . Coronary artery disease     late presentation with ant STEMI 11/12: LHC 10/12/11: oLM 20%, LAD occluded with dissection 2/2 MI.  PCI with BMS to LAD with poor distal runoff; small caliber apical vessel c/w edematous apex from delayed presentation.  . ST elevation myocardial infarction (STEMI) of anterior wall 09/2011    s/p BMS to occluded LAD  . Cardiomyopathy, ischemic 09/2011    EF 40-45%;  echo 10/16/11: Mild LVH, EF 45-50%, very apical akinesis, LV apical thrombus, grade  2 diastolic dysfunction, trivial MR, PASP 29.  . 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 Elevated LFTs     Abdominal ultrasound was unremarkable  . PSVT (paroxysmal supraventricular tachycardia)     s/p adenocard 12 mg IVP 10/2011    Past Surgical History  Procedure Date  . Cardiac catheterization     ss/p PCI BMS to LAD    Family History  Problem Relation Age of Onset  . Coronary artery disease Brother     Had MI in his 29 yo    History   Social History  . Marital Status: Married    Spouse Name: N/A    Number of Children: N/A  . Years of Education: N/A   Occupational History  . Not on file.   Social History Main Topics  . Smoking status: Never Smoker   . Smokeless tobacco: Not on file  . Alcohol Use: 0.6 oz/week    1 Cans of beer per week  . Drug Use: No  . Sexually Active: Not on file   Other Topics Concern  . Not on file   Social History Narrative  .  No narrative on file    ROS: Please see the HPI.  All other systems reviewed and negative.  PHYSICAL EXAM:  BP 138/88  Pulse 48  Ht 5\' 7"  (1.702 m)  Wt 170 lb (77.111 kg)  BMI 26.63 kg/m2  General: Well developed, well nourished, in no acute distress. Head:  Normocephalic and atraumatic. Neck: no JVD Lungs: Clear to auscultation and percussion. Heart: Normal S1 and S2.  No murmur, rubs or gallops. Pos S4.   Abdomen:  Normal bowel sounds; soft; non tender; no organomegaly Pulses: Pulses normal in all 4 extremities. Extremities: No clubbing or cyanosis. No edema. Neurologic: Alert and oriented x 3.  EKG:    ECHO: Study Conclusions  - Left ventricle: The cavity size was normal. Wall thickness was normal. Systolic function was normal. The estimated ejection fraction was in the range of 60% to 65%. There is akinesis of the apical myocardium. - Mitral valve: Mild regurgitation. - Tricuspid valve: Moderate regurgitation  CT SCAN  IMPRESSION:  1. Abnormal thickened  appearance of the left ventricular cardiac apical wall, with adjacent abnormal pericardial fluid. I note in the [facility] EPIC documentation that the patient has been diagnosed with left ventricular mural thrombus, and this could possibly account for this appearance. A thrombosed pseudoaneurysm or luminal thrombosis in the left ventricular wall cannot be readily excluded, and echocardiography may be warranted. 2. No pulmonary embolus is observed. 3. The airspace opacity left lower lobe appears primarily to be due to atelectasis, although a small amount of superimposed pneumonia is not excluded. No central obstructing lesion as a predisposing factor for recurrent atelectasis/pneumonia is identified.  Original Report Authenticated By: Dellia Cloud, M.D.       Last Resulted: 01/17/12 8:58 PM        ASSESSMENT AND PLAN:

## 2012-02-03 NOTE — ED Provider Notes (Signed)
Medical screening examination/treatment/procedure(s) were conducted as a shared visit with non-physician practitioner(s) and myself.  I personally evaluated the patient during the encounter  Toy Baker, MD 02/03/12 [phone]

## 2012-02-18 ENCOUNTER — Other Ambulatory Visit: Payer: Self-pay | Admitting: *Deleted

## 2012-02-18 ENCOUNTER — Other Ambulatory Visit: Payer: Self-pay | Admitting: Cardiology

## 2012-02-18 MED ORDER — CLOPIDOGREL BISULFATE 75 MG PO TABS: 75.0000 mg | ORAL_TABLET | Freq: Every day | ORAL | Status: DC

## 2012-02-23 ENCOUNTER — Ambulatory Visit (INDEPENDENT_AMBULATORY_CARE_PROVIDER_SITE_OTHER)
Admission: RE | Admit: 2012-02-23 | Discharge: 2012-02-23 | Disposition: A | Discharge status: Home / Self Care | Payer: BC Managed Care – PPO | Source: Ambulatory Visit | Attending: Cardiology | Admitting: Cardiology

## 2012-02-23 ENCOUNTER — Other Ambulatory Visit: Payer: Self-pay

## 2012-02-23 DIAGNOSIS — I2510 Atherosclerotic heart disease of native coronary artery without angina pectoris: Secondary | ICD-10-CM

## 2012-02-23 DIAGNOSIS — I10 Essential (primary) hypertension: Secondary | ICD-10-CM

## 2012-02-23 MED ORDER — PRAVASTATIN SODIUM 80 MG PO TABS: 80.0000 mg | ORAL_TABLET | Freq: Every evening | ORAL | Status: DC

## 2012-02-23 MED ORDER — METOPROLOL TARTRATE 50 MG PO TABS: 50.0000 mg | ORAL_TABLET | Freq: Two times a day (BID) | ORAL | Status: DC

## 2012-02-23 NOTE — Telephone Encounter (Signed)
..   Requested Prescriptions   Signed Prescriptions Disp Refills  . metoprolol (LOPRESSOR) 50 MG tablet 60 tablet 6    Sig: Take 1 tablet (50 mg total) by mouth 2 (two) times daily.    Authorizing Provider: Cassell Clement    Ordering User: Parker Wherley M  . pravastatin (PRAVACHOL) 80 MG tablet 30 tablet 6    Sig: Take 1 tablet (80 mg total) by mouth every evening.    Authorizing Provider: Cassell Clement    Ordering User: Christella Hartigan, Raylen Tangonan Judie Petit

## 2012-02-25 ENCOUNTER — Ambulatory Visit (INDEPENDENT_AMBULATORY_CARE_PROVIDER_SITE_OTHER): Payer: BC Managed Care – PPO | Admitting: Cardiology

## 2012-02-25 ENCOUNTER — Encounter: Payer: Self-pay | Admitting: Cardiology

## 2012-02-25 VITALS — BP 148/88 | HR 68 | Ht 67.0 in | Wt 172.8 lb

## 2012-02-25 DIAGNOSIS — I238 Other current complications following acute myocardial infarction: Secondary | ICD-10-CM

## 2012-02-25 DIAGNOSIS — I236 Thrombosis of atrium, auricular appendage, and ventricle as current complications following acute myocardial infarction: Secondary | ICD-10-CM

## 2012-02-25 DIAGNOSIS — I2510 Atherosclerotic heart disease of native coronary artery without angina pectoris: Secondary | ICD-10-CM

## 2012-02-25 DIAGNOSIS — E785 Hyperlipidemia, unspecified: Secondary | ICD-10-CM

## 2012-02-25 DIAGNOSIS — I10 Essential (primary) hypertension: Secondary | ICD-10-CM

## 2012-02-25 NOTE — Patient Instructions (Signed)
Your physician wants you to follow-up in: 3 MONTHS with Dr Stuckey.  You will receive a reminder letter in the mail two months in advance. If you don't receive a letter, please call our office to schedule the follow-up appointment.  Your physician recommends that you continue on your current medications as directed. Please refer to the Current Medication list given to you today.  

## 2012-02-26 ENCOUNTER — Other Ambulatory Visit: Payer: Self-pay

## 2012-02-26 MED ORDER — PRAVASTATIN SODIUM 80 MG PO TABS: 80.0000 mg | ORAL_TABLET | Freq: Every evening | ORAL | Status: DC

## 2012-02-26 MED ORDER — CLOPIDOGREL BISULFATE 75 MG PO TABS: 75.0000 mg | ORAL_TABLET | Freq: Every day | ORAL | Status: DC

## 2012-02-26 MED ORDER — METOPROLOL TARTRATE 50 MG PO TABS: 50.0000 mg | ORAL_TABLET | Freq: Two times a day (BID) | ORAL | Status: DC

## 2012-02-27 NOTE — ED Provider Notes (Signed)
Medical screening examination/treatment/procedure(s) were conducted as a shared visit with non-physician practitioner(s) and myself.  I personally evaluated the patient during the encounter  Toy Baker, MD 02/27/12 [phone]

## 2012-03-25 ENCOUNTER — Encounter (HOSPITAL_COMMUNITY): Payer: Self-pay | Admitting: Emergency Medicine

## 2012-03-25 ENCOUNTER — Emergency Department (HOSPITAL_COMMUNITY): Payer: BC Managed Care – PPO

## 2012-03-25 ENCOUNTER — Emergency Department (HOSPITAL_COMMUNITY)
Admission: EM | Admit: 2012-03-25 | Discharge: 2012-03-25 | Disposition: A | Discharge status: Home / Self Care | Payer: BC Managed Care – PPO | Attending: Emergency Medicine | Admitting: Emergency Medicine

## 2012-03-25 DIAGNOSIS — I2510 Atherosclerotic heart disease of native coronary artery without angina pectoris: Secondary | ICD-10-CM | POA: Insufficient documentation

## 2012-03-25 DIAGNOSIS — Z79899 Other long term (current) drug therapy: Secondary | ICD-10-CM | POA: Insufficient documentation

## 2012-03-25 DIAGNOSIS — E785 Hyperlipidemia, unspecified: Secondary | ICD-10-CM | POA: Insufficient documentation

## 2012-03-25 DIAGNOSIS — I252 Old myocardial infarction: Secondary | ICD-10-CM | POA: Insufficient documentation

## 2012-03-25 DIAGNOSIS — R002 Palpitations: Secondary | ICD-10-CM

## 2012-03-25 DIAGNOSIS — I10 Essential (primary) hypertension: Secondary | ICD-10-CM | POA: Insufficient documentation

## 2012-03-25 LAB — DIFFERENTIAL
Blasts: 0 %
Eosinophils Relative: 1 % (ref 0–5)
Metamyelocytes Relative: 0 %
Myelocytes: 0 %
Neutro Abs: 2.8 10*3/uL (ref 1.7–7.7)
Neutrophils Relative %: 49 % (ref 43–77)
Promyelocytes Absolute: 0 %
nRBC: 0 /100 WBC

## 2012-03-25 LAB — COMPREHENSIVE METABOLIC PANEL
ALT: 21 U/L (ref 0–53)
AST: 25 U/L (ref 0–37)
Albumin: 4.1 g/dL (ref 3.5–5.2)
CO2: 24 mEq/L (ref 19–32)
Chloride: 101 mEq/L (ref 96–112)
GFR calc non Af Amer: >90 mL/min (ref >90)
Potassium: 3.9 mEq/L (ref 3.5–5.1)
Sodium: 136 mEq/L (ref 135–145)
Total Bilirubin: 0.5 mg/dL (ref 0.3–1.2)

## 2012-03-25 LAB — CBC
MCH: 31.2 pg (ref 26.0–34.0)
MCHC: 35.6 g/dL (ref 30.0–36.0)
MCV: 87.5 fL (ref 78.0–100.0)
Platelets: 218 10*3/uL (ref 150–400)
RBC: 4.65 MIL/uL (ref 4.22–5.81)
RDW: 14.7 % (ref 11.5–15.5)

## 2012-03-25 LAB — URINALYSIS, ROUTINE W REFLEX MICROSCOPIC
Bilirubin Urine: NEGATIVE
Glucose, UA: NEGATIVE mg/dL
Hgb urine dipstick: NEGATIVE
Protein, ur: NEGATIVE mg/dL
Urobilinogen, UA: 0.2 mg/dL (ref 0.0–1.0)

## 2012-03-25 LAB — POCT I-STAT TROPONIN I: Troponin i, poc: 0 ng/mL (ref 0.00–0.08)

## 2012-03-25 NOTE — Discharge Instructions (Signed)
Stop taking lisinopril.  Instead, take Losartan 25 mg once a day.

## 2012-03-25 NOTE — ED Provider Notes (Signed)
History     CSN: [id number]  Arrival date & time 03/25/12  0830   First MD Initiated Contact with Patient 03/25/12 [phone]      Chief Complaint  Patient presents with  . Palpitations    (Consider location/radiation/quality/duration/timing/severity/associated sxs/prior treatment) HPI Comments: The patient is a 46 year old man who says that his heart is having irregular heart beats. This started about 2 weeks ago. He feels a skip in the beat from time to time. Seems to be getting more often in the past day or so. He notes that he had recent increase of his lisinopril from 5-10 mg per day because his blood pressure was high. He also notes that he had a heart attack in November 2012 treated with angioplasty and stenting. He has had no chest pain and no shortness of breath with this particular complaint.  Patient is a 46 y.o. male presenting with palpitations. The history is provided by the patient and medical records. No language interpreter was used.  Palpitations  This is a new problem. The current episode started more than 1 week ago. Episode frequency: He feels a skipping of his heart intermittently, every hour or so in the past day or 2. The problem has been gradually worsening. Associated with: Possibly associated with increase of the dose of his lisinopril. Associated symptoms comments: He has also developed a cough since the lisinopril dose was increased.[name] He has tried nothing for the symptoms. Risk factors: Known coronary artery disease with prior myocardial infarction. His past medical history is significant for heart disease.    Past Medical History  Diagnosis Date  . HTN (hypertension)   . HLD (hyperlipidemia)   . Family history of early CAD     Brother had MI at age 10 yo.  . Coronary artery disease     late presentation with ant STEMI 11/12: LHC 10/12/11: oLM 20%, LAD occluded with dissection 2/2 MI.  PCI with BMS to LAD with poor distal runoff; small caliber apical vessel c/w  edematous apex from delayed presentation.  . ST elevation myocardial infarction (STEMI) of anterior wall 09/2011    s/p BMS to occluded LAD  . Cardiomyopathy, ischemic 09/2011    EF 40-45%;  echo 10/16/11: Mild LVH, EF 45-50%, very apical akinesis, LV apical thrombus, grade 2 diastolic dysfunction, trivial MR, PASP 29.  . 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 Elevated LFTs     Abdominal ultrasound was unremarkable  . PSVT (paroxysmal supraventricular tachycardia)     s/p adenocard 12 mg IVP 10/2011    Past Surgical History  Procedure Date  . Cardiac catheterization     ss/p PCI BMS to LAD    Family History  Problem Relation Age of Onset  . Coronary artery disease Brother     Had MI in his 52 yo    History  Substance Use Topics  . Smoking status: Never Smoker   . Smokeless tobacco: Not on file  . Alcohol Use: 0.6 oz/week    1 Cans of beer per week      Review of Systems  Cardiovascular: Positive for palpitations.    Allergies  Review of patient's allergies indicates no known allergies.  Home Medications   Current Outpatient Rx  Name Route Sig Dispense Refill  . ACETAMINOPHEN 500 MG PO TABS Oral Take 1,000 mg by mouth every 6 (six) hours as needed. For pain    . CLOPIDOGREL BISULFATE 75 MG PO TABS Oral Take 1 tablet (75  mg total) by mouth daily with breakfast. 30 tablet 6  . LISINOPRIL 10 MG PO TABS Oral Take 1 tablet (10 mg total) by mouth daily. 30 tablet 6  . METOPROLOL TARTRATE 50 MG PO TABS Oral Take 1 tablet (50 mg total) by mouth 2 (two) times daily. 60 tablet 6  . NITROGLYCERIN 0.4 MG SL SUBL Sublingual Place 0.4 mg under the tongue every 5 (five) minutes as needed. For chest pain.     [name] PRAVASTATIN SODIUM 80 MG PO TABS Oral Take 1 tablet (80 mg total) by mouth every evening. 30 tablet 6  . ZOLPIDEM TARTRATE 5 MG PO TABS Oral Take 2.5-5 mg by mouth at bedtime as needed. For sleep      BP 157/86  Pulse 78  Temp(Src) 98.2 F (36.8 C)  (Oral)  Resp 18  SpO2 100%  Physical Exam  Nursing note and vitals reviewed. Constitutional: He is oriented to person, place, and time. He appears well-developed and well-nourished. No distress.  HENT:  Head: Normocephalic and atraumatic.  Right Ear: External ear normal.  Left Ear: External ear normal.  Mouth/Throat: Oropharynx is clear and moist.  Eyes: Conjunctivae and EOM are normal. Pupils are equal, round, and reactive to light.  Neck: Normal range of motion.  Cardiovascular: Normal rate, regular rhythm and normal heart sounds.   Pulmonary/Chest: Effort normal and breath sounds normal.  Abdominal: Soft. Bowel sounds are normal.  Musculoskeletal: Normal range of motion. He exhibits no edema and no tenderness.  Neurological: He is alert and oriented to person, place, and time.       No sensory or motor deficits  Skin: Skin is warm and dry.  Psychiatric: He has a normal mood and affect. His behavior is normal.    ED Course  Procedures (including critical care time)   8:46 AM  Date: 03/25/2012  Rate:69  Rhythm: normal sinus rhythm  QRS Axis: left  Intervals: normal QRS:  Q waves in inferior leads suggest old inferior myocardial infarction, poor R wave progression in lateral precordial leads suggests old antero-lateral myocardial infarction.  ST/T Wave abnormalities: normal  Conduction Disutrbances:none  Narrative Interpretation: Abnormal EKG.  Old EKG Reviewed: unchanged  9:12 AM Pt was seen and physical exam was performed.  Lab workup was ordered.  EKG showed old myocardial infarction.  Old charts reviewed.  11:27 AM Results for orders placed during the hospital encounter of 03/25/12  CBC      Component Value Range   WBC 5.9  4.0 - 10.5 (K/uL)   RBC 4.65  4.22 - 5.81 (MIL/uL)   Hemoglobin 14.5  13.0 - 17.0 (g/dL)   HCT 40.9  81.1 - 91.4 (%)   MCV 87.5  78.0 - 100.0 (fL)   MCH 31.2  26.0 - 34.0 (pg)   MCHC 35.6  30.0 - 36.0 (g/dL)   RDW 78.2  95.6 - 21.3 (%)    Platelets 218  150 - 400 (K/uL)  DIFFERENTIAL      Component Value Range   Neutrophils Relative 49  43 - 77 (%)   Lymphocytes Relative 47 (*) 12 - 46 (%)   Monocytes Relative 3  3 - 12 (%)   Eosinophils Relative 1  0 - 5 (%)   Basophils Relative 0  0 - 1 (%)   Band Neutrophils 0  0 - 10 (%)   Metamyelocytes Relative 0     Myelocytes 0     Promyelocytes Absolute 0     Blasts 0  nRBC 0  0 (/100 WBC)   Neutro Abs 2.8  1.7 - 7.7 (K/uL)   Lymphs Abs 2.8  0.7 - 4.0 (K/uL)   Monocytes Absolute 0.2  0.1 - 1.0 (K/uL)   Eosinophils Absolute 0.1  0.0 - 0.7 (K/uL)   Basophils Absolute 0.0  0.0 - 0.1 (K/uL)   RBC Morphology ELLIPTOCYTES     WBC Morphology ATYPICAL LYMPHOCYTES     Smear Review LARGE PLATELETS PRESENT    COMPREHENSIVE METABOLIC PANEL      Component Value Range   Sodium 136  135 - 145 (mEq/L)   Potassium 3.9  3.5 - 5.1 (mEq/L)   Chloride 101  96 - 112 (mEq/L)   CO2 24  19 - 32 (mEq/L)   Glucose, Bld 101 (*) 70 - 99 (mg/dL)   BUN 11  6 - 23 (mg/dL)   Creatinine, Ser 6.21  0.50 - 1.35 (mg/dL)   Calcium 9.4  8.4 - 30.8 (mg/dL)   Total Protein 6.9  6.0 - 8.3 (g/dL)   Albumin 4.1  3.5 - 5.2 (g/dL)   AST 25  0 - 37 (U/L)   ALT 21  0 - 53 (U/L)   Alkaline Phosphatase 69  39 - 117 (U/L)   Total Bilirubin 0.5  0.3 - 1.2 (mg/dL)   GFR calc non Af Amer >90  >90 (mL/min)   GFR calc Af Amer >90  >90 (mL/min)  URINALYSIS, ROUTINE W REFLEX MICROSCOPIC      Component Value Range   Color, Urine YELLOW  YELLOW    APPearance CLEAR  CLEAR    Specific Gravity, Urine 1.010  1.005 - 1.030    pH 6.0  5.0 - 8.0    Glucose, UA NEGATIVE  NEGATIVE (mg/dL)   Hgb urine dipstick NEGATIVE  NEGATIVE    Bilirubin Urine NEGATIVE  NEGATIVE    Ketones, ur NEGATIVE  NEGATIVE (mg/dL)   Protein, ur NEGATIVE  NEGATIVE (mg/dL)   Urobilinogen, UA 0.2  0.0 - 1.0 (mg/dL)   Nitrite NEGATIVE  NEGATIVE    Leukocytes, UA NEGATIVE  NEGATIVE   POCT I-STAT TROPONIN I      Component Value Range   Troponin i,  poc 0.00  0.00 - 0.08 (ng/mL)   Comment 3            Dg Chest 2 View  03/25/2012  *RADIOLOGY REPORT*  Clinical Data: Chest pain, palpitations.  CHEST - 2 VIEW  Comparison: 02/23/2012  Findings: Heart and mediastinal contours are within normal limits. No focal opacities or effusions.  No acute bony abnormality.  IMPRESSION: No active cardiopulmonary disease.  Original Report Authenticated By: Cyndie Chime, M.D.    Lab tests were all negative.  Call To [facility] Cardiology -->  2:01 PM Pt seen by Dr. Juanito Doom --> advised stopping lisinopril, starting losartan 25 mg po qd.    1. Palpitations            Carleene Cooper III, MD 03/25/12 1407

## 2012-03-25 NOTE — Consult Note (Addendum)
CARDIOLOGY CONSULT NOTE  Rodney Marquez Internal Medicine Resident Note  Patient ID: Rodney Marquez MRN: [id number] DOB/AGE: [birth year] 46 y.o.  Admit date: 03/25/2012 Referring Physician: Dr Carleene Marquez  Primary Physician: Rodney Rodney Marquez Primary Cardiologist: Rodney Rodney Marquez Reason for Consultation: Palpitation   HPI: This is a 46 year old pleasant gentleman with past medical history significant for STEMI in  11/ 2012 status post bare-metal stenting to occluded LAD, Hypertension, Hyperlipidemia, Left ventricle  mural thrombus from MI completed 4 month of anticoagulation with Coumadin (d/c 01/28/12), paroxysmal supraventricular tachycardia status post Adenocard 10/2011 at [facility] who presented to the ED with palpitation. Patient reports that the palpitations started 2 weeks ago and has been increasing in frequency. He does not recall any aggravating or alleviating factors but noted he would experience the symptoms more with resting rather than walking. He noted that he has never experienced this before.  Denies any dizziness, lightheadedness, syncope, headache, chest pain, shortness of breath or abdominal pain with the palpitations. Per chart review patient's lisinopril was increased from 5 mg to 10 mg on 01/28/2012 by Rodney. Riley Marquez.  No other medication changes were performed. Patient is not taking any over-the-counter or herbal medication. Denies any drug use, recent travel or sick contact.  Patient endorses cough since the increased dosage of lisinopril from 5 mg to 10 mg. He noted that with 5 mg he had no cough. He reports that he had been on ACE inhibitors in the past which was discontinued due to persistent cough.   Of note: Patient was evaluated on 01/17/2012 in the ED for chest pain. CT chest with contrast was obtained which showed abnormal thickened appearance of the left ventricular cardiac apical Rodney Marquez, with adjacent abnormal pericardial fluid.  A thrombosed pseudoaneurysm or luminal thrombosis  in the left ventricular Rodney Marquez could not be excluded. No pulmonary embolus. A 2-D echo was performed on  01/18/2012 which showed cavity size was normal. Rodney Marquez thickness was normal. Systolic function was normal. The estimated ejection fraction was in the range of 60% to 65%. There is akinesis of the apical myocardium. Mitral valve: Mild regurgitation. Tricuspid valve: Moderate regurgitation.     Past Medical History  Diagnosis Date  . HTN (hypertension)   . HLD (hyperlipidemia)   . Family history of early CAD     Brother had MI at age 37 yo.  . Coronary artery disease     late presentation with ant STEMI 11/12: LHC 10/12/11: oLM 20%, LAD occluded with dissection 2/2 MI.  PCI with BMS to LAD with poor distal runoff; small caliber apical vessel c/w edematous apex from delayed presentation.  . ST elevation myocardial infarction (STEMI) of anterior Rodney Marquez 09/2011    s/p BMS to occluded LAD  . Cardiomyopathy, ischemic 09/2011    Eecho 10/16/11: Mild LVH, EF 45-50%, very apical akinesis, LV apical thrombus, grade 2 diastolic dysfunction, trivial MR, PASP 29. Echo 01/18/12 : The estimated ejection fraction was in the range of 60% to 65%. There is akinesis of the apical myocardium. Mitral valve: Mild regurgitation. Tricuspid valve: Moderate regurgitation.  . 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 Elevated LFTs     Abdominal ultrasound was unremarkable  . PSVT (paroxysmal supraventricular tachycardia)     s/p adenocard 12 mg IVP 10/2011     Past Surgical History  Procedure Date  . Cardiac catheterization     ss/p PCI BMS to LAD     Family History  Problem Relation Age of Onset  .  Coronary artery disease Brother     Had MI in his 83 yo    Social History: History   Social History  . Marital Status: Single    Spouse Name: N/A    Number of Children: N/A  . Years of Education: N/A   Occupational History  . Not on file.   Social History Main Topics  . Smoking status:  Never Smoker   . Smokeless tobacco: Not on file  . Alcohol Use: 0.6 oz/week    1 Cans of beer per week  . Drug Use: No  . Sexually Active: Not on file   Other Topics Concern  . Not on file   Social History Narrative  . No narrative on file      ROS: Bold if positive  General:  fevers/chills/night sweats Eyes:  blurry vision, diplopia,  amaurosis ENT:  sore throat , hearing loss Resp: cough, wheezing,  hemoptysis CV: edema , palpitations GI:  abdominal pain, nausea, vomiting, diarrhea,  constipation GU: dysuria, frequency,  hematuria Skin:  rash Neuro: headache, numbness, tingling,  weakness of extremities Musculoskeletal: joint pain or swelling Heme:  bleeding, , or easy bruising Endo:  polydipsia , polyuria    Physical Exam: Blood pressure 117/72, pulse 63, temperature 98.2 F (36.8 C), temperature source Oral, resp. rate 15, SpO2 98.00%.  General: Vital signs reviewed and noted. Well-developed, well-nourished, in no acute distress; alert, appropriate and cooperative throughout examination.  HEENT: normal Neck: JVP normal. Carotid upstrokes normal without bruits.  Lungs: Normal respiratory effort. Clear to auscultation BL without crackles or wheezes. CV: Apex is discrete and nondisplaced, RRR without murmur or gallop Abd: soft, NT, +BS, no bruit, no hepatosplenomegaly Back: no CVA tenderness Ext: no clubbing, cyanosis, edema         DP/PT pulses intact and equal Skin: warm and dry without rash Neuro: CNII-XII intact             Strength intact equaly bilaterally  Labs: CBC:  Basename 03/25/12 0907  WBC 5.9  NEUTROABS 2.8  HGB 14.5  HCT 40.7  MCV 87.5  PLT 218   CMET:  Lab 03/25/12 0907  NA 136  K 3.9  CL 101  CO2 24  BUN 11  CREATININE 0.96  CALCIUM 9.4  PROT 6.9  BILITOT 0.5  ALKPHOS 69  ALT 21  AST 25  GLUCOSE 101*   Cardiac Enzymes:   Ref. Range 03/25/2012 09:29  Troponin i, poc Latest Range: 0.00-0.08 ng/mL 0.00   Fasting Lipid  Panel:   Ref. Range 01/25/2012 08:44  Cholesterol Latest Range: 0-200 mg/dL 782  Triglycerides Latest Range: 0.0-149.0 mg/dL 95.6  HDL Latest Range: >39.00 mg/dL 21.30 (L)  LDL (calc) Latest Range: 0-99 mg/dL 54  VLDL Latest Range: 0.0-40.0 mg/dL 86.5  Total CHOL/HDL Ratio No range found 3  Thyroid Function Tests:   Ref. Range 10/31/2011 18:47  TSH Latest Range: 0.350-4.500 uIU/mL 0.490    Radiology: Dg Chest 2 View  03/25/2012  *RADIOLOGY REPORT*  Clinical Data: Chest pain, palpitations.  CHEST - 2 VIEW  Comparison: 02/23/2012  Findings: Heart and mediastinal contours are within normal limits. No focal opacities or effusions.  No acute bony abnormality.  IMPRESSION: No active cardiopulmonary disease.  Original Report Authenticated By: Rodney Marquez, M.D.    EKG: Normal sinus rhythm. Left Axis. HR 69.  Q waves in inferior leads suggestive of old inferior MI, Poor R wave progression in lateral precordial leads. Unchanged from ECG 01/17/12.  Last Echo: A 2-D echo was performed on  01/18/2012 which showed cavity size was normal. Rodney Marquez thickness was normal. Systolic function was normal. The estimated ejection fraction was in the range of 60% to 65%. There is akinesis of the apical myocardium. Mitral valve: Mild regurgitation. Tricuspid valve: Moderate regurgitation.  Tele: NSL. No arrythmia or PVC noted.   ASSESSMENT AND PLAN:  This is a 46 year old pleasant gentleman with past history significant for STEMI in  11/ 2012 status post bare-metal stenting to occluded LAD, Hypertension, Hyperlipidemia, Left ventricle  mural thrombus from MI completed 4 month of anticoagulation with Coumadin (d/c 01/28/12), paroxysmal supraventricular tachycardia status post Adenocard 10/2011 at [facility] who presented to the ED with palpitation.   1. Palpation unlikely cardiac in origin. No arrhythmias or PVCs were noted on Tele ( patient was in the ED from 8:30 am to 2 pm), no changes on ECG and negative Troponin x1. TSH  within normal limits 10/2011.  DD include MSK or anxiety. Unlikely Lisinopril have caused palpitation. Patient is currently on Metoprolol. Patient was discharged with instruction to continue his current medication regimen including Aspirin, Plavix and Metoprolol 50 mg BID.   2. Hypertension: Lisinopril was increased from 5 mg to 10 mg on 01/28/2012 secondary to elevated blood pressure.  Considering patient's cough recommended lisinopril and start patient on Lasartan 25 mg daily. Recommended to continue metoprolol 50 mg twice a day since patient's palpitation is not cardiac in origin.   3. Hyperlipidemia: LDL was 54 at goal (01/2012) continue current regimen.    Patient history and plan of care reviewed with attending, Rodney. Valera Castle  [facility] 03/25/2012, 12:07 PM I have taken a history, reviewed medications, allergies, PMH, SH, FH, and reviewed ROS and examined the patient.  I agree with the assessment and plan.Jesse Sans. Daleen Squibb, MD, [facility] Pager:  [phone]   Valera Castle, MD 03/27/2012 10:29 AM

## 2012-03-25 NOTE — ED Notes (Signed)
Cardiology at bedside.

## 2012-03-25 NOTE — ED Notes (Signed)
Pt c/o intermittent palpitations x 2 weeks that were worse today; pt sts hx of MI in November; pt denies CP; pt sts some SOB

## 2012-04-04 NOTE — Assessment & Plan Note (Signed)
Repeat by me was ok today.  See note.

## 2012-04-04 NOTE — Assessment & Plan Note (Signed)
Stable with symptomatic recurrence.

## 2012-04-04 NOTE — Assessment & Plan Note (Signed)
Now off warfarin for more than one month without difficulty.

## 2012-04-04 NOTE — Assessment & Plan Note (Signed)
LDL is at target.   

## 2012-04-04 NOTE — Progress Notes (Signed)
HPI:  He is doing great.  Walking regularly without symptoms.  No major issues today.  He is out nearly five months, and off warfarin anticoagulation for apical mural thrombus at this point.    Current Outpatient Prescriptions  Medication Sig Dispense Refill  . acetaminophen (TYLENOL) 500 MG tablet Take 1,000 mg by mouth every 6 (six) hours as needed. For pain      . lisinopril (PRINIVIL,ZESTRIL) 10 MG tablet Take 1 tablet (10 mg total) by mouth daily.  30 tablet  6  . nitroGLYCERIN (NITROSTAT) 0.4 MG SL tablet Place 0.4 mg under the tongue every 5 (five) minutes as needed. For chest pain.       [name] zolpidem (AMBIEN) 5 MG tablet Take 2.5-5 mg by mouth at bedtime as needed. For sleep      . clopidogrel (PLAVIX) 75 MG tablet Take 1 tablet (75 mg total) by mouth daily with breakfast.  30 tablet  6  . metoprolol (LOPRESSOR) 50 MG tablet Take 1 tablet (50 mg total) by mouth 2 (two) times daily.  60 tablet  6  . pravastatin (PRAVACHOL) 80 MG tablet Take 1 tablet (80 mg total) by mouth every evening.  30 tablet  6  . DISCONTD: lisinopril (PRINIVIL,ZESTRIL) 5 MG tablet Take 1 tablet (5 mg total) by mouth daily.  30 tablet  6  . DISCONTD: metoprolol (LOPRESSOR) 50 MG tablet Take 1 tablet (50 mg total) by mouth 2 (two) times daily.  60 tablet  6  . DISCONTD: nitroGLYCERIN (NITROSTAT) 0.4 MG SL tablet Place 1 tablet (0.4 mg total) under the tongue every 5 (five) minutes as needed for chest pain (up to 3 doses. If you have to take the 3rd dose, call 911).  25 tablet  4    No Known Allergies  Past Medical History  Diagnosis Date  . HTN (hypertension)   . HLD (hyperlipidemia)   . Family history of early CAD     Brother had MI at age 87 yo.  . Coronary artery disease     late presentation with ant STEMI 11/12: LHC 10/12/11: oLM 20%, LAD occluded with dissection 2/2 MI.  PCI with BMS to LAD with poor distal runoff; small caliber apical vessel c/w edematous apex from delayed presentation.  . ST elevation  myocardial infarction (STEMI) of anterior wall 09/2011    s/p BMS to occluded LAD  . Cardiomyopathy, ischemic 09/2011    EF 40-45%;  echo 10/16/11: Mild LVH, EF 45-50%, very apical akinesis, LV apical thrombus, grade 2 diastolic dysfunction, trivial MR, PASP 29.  . 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 Elevated LFTs     Abdominal ultrasound was unremarkable  . PSVT (paroxysmal supraventricular tachycardia)     s/p adenocard 12 mg IVP 10/2011    Past Surgical History  Procedure Date  . Cardiac catheterization     ss/p PCI BMS to LAD    Family History  Problem Relation Age of Onset  . Coronary artery disease Brother     Had MI in his 8 yo    History   Social History  . Marital Status: Single    Spouse Name: N/A    Number of Children: N/A  . Years of Education: N/A   Occupational History  . Not on file.   Social History Main Topics  . Smoking status: Never Smoker   . Smokeless tobacco: Not on file  . Alcohol Use: 0.6 oz/week    1 Cans of  beer per week  . Drug Use: No  . Sexually Active: Not on file   Other Topics Concern  . Not on file   Social History Narrative  . No narrative on file    ROS: Please see the HPI.  All other systems reviewed and negative.  PHYSICAL EXAM:  BP 148/88  Pulse 68  Ht 5\' 7"  (1.702 m)  Wt 172 lb 12.8 oz (78.382 kg)  BMI 27.06 kg/m2  Repeat BP  122/77 General: Well developed, well nourished, in no acute distress. Head:  Normocephalic and atraumatic. Neck: no JVD Lungs: Clear to auscultation and percussion. Heart: Normal S1 and S2.  No murmur, rubs or gallops.  Pulses: Pulses normal in all 4 extremities. Extremities: No clubbing or cyanosis. No edema. Neurologic: Alert and oriented x 3.  EKG:  ASSESSMENT AND PLAN:

## 2012-06-20 ENCOUNTER — Ambulatory Visit (INDEPENDENT_AMBULATORY_CARE_PROVIDER_SITE_OTHER): Payer: BC Managed Care – PPO | Admitting: Cardiology

## 2012-06-20 ENCOUNTER — Encounter: Payer: Self-pay | Admitting: Cardiology

## 2012-06-20 VITALS — BP 125/74 | HR 53 | Ht 67.0 in | Wt 172.0 lb

## 2012-06-20 DIAGNOSIS — E785 Hyperlipidemia, unspecified: Secondary | ICD-10-CM

## 2012-06-20 DIAGNOSIS — I10 Essential (primary) hypertension: Secondary | ICD-10-CM

## 2012-06-20 DIAGNOSIS — I2510 Atherosclerotic heart disease of native coronary artery without angina pectoris: Secondary | ICD-10-CM

## 2012-06-20 NOTE — Patient Instructions (Signed)
Your physician recommends that you schedule a follow-up appointment in: 4 MONTHS with Dr Stuckey  Your physician recommends that you continue on your current medications as directed. Please refer to the Current Medication list given to you today.  

## 2012-06-21 NOTE — Progress Notes (Signed)
HPI:  The patient is seen today in followup. We discussed his FMLA filing, and he says he is okay without the specific answers.  Clinically, the patient is been stable. However, about a week ago he had some discomfort lasted about 2 days. He did not get short of breath and there is no significant change in the way he felt. The patient is otherwise remained stable.  Current Outpatient Prescriptions  Medication Sig Dispense Refill  . acetaminophen (TYLENOL) 500 MG tablet Take 1,000 mg by mouth every 6 (six) hours as needed. For pain      . clopidogrel (PLAVIX) 75 MG tablet Take 1 tablet (75 mg total) by mouth daily with breakfast.  30 tablet  6  . lisinopril (PRINIVIL,ZESTRIL) 10 MG tablet Take 1 tablet (10 mg total) by mouth daily.  30 tablet  6  . metoprolol (LOPRESSOR) 50 MG tablet Take 1 tablet (50 mg total) by mouth 2 (two) times daily.  60 tablet  6  . nitroGLYCERIN (NITROSTAT) 0.4 MG SL tablet Place 0.4 mg under the tongue every 5 (five) minutes as needed. For chest pain.       . pravastatin (PRAVACHOL) 80 MG tablet Take 1 tablet (80 mg total) by mouth every evening.  30 tablet  6  . zolpidem (AMBIEN) 5 MG tablet Take 2.5-5 mg by mouth at bedtime as needed. For sleep      . aspirin EC 81 MG tablet Take 1 tablet (81 mg total) by mouth daily.  1 tablet  0  . DISCONTD: lisinopril (PRINIVIL,ZESTRIL) 5 MG tablet Take 1 tablet (5 mg total) by mouth daily.  30 tablet  6  . DISCONTD: metoprolol (LOPRESSOR) 50 MG tablet Take 1 tablet (50 mg total) by mouth 2 (two) times daily.  60 tablet  6  . DISCONTD: nitroGLYCERIN (NITROSTAT) 0.4 MG SL tablet Place 1 tablet (0.4 mg total) under the tongue every 5 (five) minutes as needed for chest pain (up to 3 doses. If you have to take the 3rd dose, call 911).  25 tablet  4    No Known Allergies  Past Medical History  Diagnosis Date  . HTN (hypertension)   . HLD (hyperlipidemia)   . Family history of early CAD     Brother had MI at age 52 yo.  .  Coronary artery disease     late presentation with ant STEMI 11/12: LHC 10/12/11: oLM 20%, LAD occluded with dissection 2/2 MI.  PCI with BMS to LAD with poor distal runoff; small caliber apical vessel c/w edematous apex from delayed presentation.  . ST elevation myocardial infarction (STEMI) of anterior wall 09/2011    s/p BMS to occluded LAD  . Cardiomyopathy, ischemic 09/2011    EF 40-45%;  echo 10/16/11: Mild LVH, EF 45-50%, very apical akinesis, LV apical thrombus, grade 2 diastolic dysfunction, trivial MR, PASP 29.  . 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 Elevated LFTs     Abdominal ultrasound was unremarkable  . PSVT (paroxysmal supraventricular tachycardia)     s/p adenocard 12 mg IVP 10/2011    Past Surgical History  Procedure Date  . Cardiac catheterization     ss/p PCI BMS to LAD    Family History  Problem Relation Age of Onset  . Coronary artery disease Brother     Had MI in his 82 yo    History   Social History  . Marital Status: Single    Spouse Name: N/A  Number of Children: N/A  . Years of Education: N/A   Occupational History  . Not on file.   Social History Main Topics  . Smoking status: Never Smoker   . Smokeless tobacco: Never Used  . Alcohol Use: 0.6 oz/week    1 Cans of beer per week  . Drug Use: No  . Sexually Active: Not on file   Other Topics Concern  . Not on file   Social History Narrative  . No narrative on file    ROS: Please see the HPI.  All other systems reviewed and negative.  PHYSICAL EXAM:  BP 125/74  Pulse 53  Ht 5\' 7"  (1.702 m)  Wt 172 lb (78.019 kg)  BMI 26.94 kg/m2  General: Well developed, well nourished, in no acute distress. Head:  Normocephalic and atraumatic. Neck: no JVD Lungs: Clear to auscultation and percussion. Heart: Normal S1 and S2.  No murmur, rubs or gallops.  Abdomen:  Normal bowel sounds; soft; non tender; no organomegaly Pulses: Pulses normal in all 4  extremities. Extremities: No clubbing or cyanosis. No edema. Neurologic: Alert and oriented x 3.  EKG:  NSR.  Marked T inversion in anterior precordial leads, unchanged from prior tracings.  See presenting ECG in EMR.    ECHO 12/2011  Study Conclusions  - Left ventricle: The cavity size was normal. Wall thickness was normal. Systolic function was normal. The estimated ejection fraction was in the range of 60% to 65%. There is akinesis of the apical myocardium. - Mitral valve: Mild regurgitation. - Tricuspid valve: Moderate regurgitation.    ASSESSMENT AND PLAN:

## 2012-06-26 NOTE — Assessment & Plan Note (Signed)
At target on pravastatin. 

## 2012-06-26 NOTE — Assessment & Plan Note (Signed)
Not currently an issue

## 2012-06-26 NOTE — Assessment & Plan Note (Signed)
The patient had late presentation with acute myocardial infarction predominantly involving the distal left anterior descending artery, and specifically the cardiac apex. He's continued to have electrocardiographic abnormalities, but his echocardiogram demonstrates good left ventricular function. He took a course of warfarin for left ventricular mural thrombus, he is now off this. He will continue on medical therapy.

## 2012-07-07 ENCOUNTER — Other Ambulatory Visit: Payer: Self-pay | Admitting: Physician Assistant

## 2012-10-26 ENCOUNTER — Ambulatory Visit (INDEPENDENT_AMBULATORY_CARE_PROVIDER_SITE_OTHER): Payer: BC Managed Care – PPO | Admitting: Cardiology

## 2012-10-26 ENCOUNTER — Encounter: Payer: Self-pay | Admitting: Cardiology

## 2012-10-26 VITALS — BP 142/100 | HR 67 | Ht 67.0 in | Wt 183.0 lb

## 2012-10-26 DIAGNOSIS — I10 Essential (primary) hypertension: Secondary | ICD-10-CM

## 2012-10-26 DIAGNOSIS — I2510 Atherosclerotic heart disease of native coronary artery without angina pectoris: Secondary | ICD-10-CM

## 2012-10-26 DIAGNOSIS — E785 Hyperlipidemia, unspecified: Secondary | ICD-10-CM

## 2012-10-26 NOTE — Patient Instructions (Addendum)
Your physician has recommended you make the following change in your medication: STOP Plavix  Your physician has requested that you regularly monitor and record your blood pressure readings at home. Please use the same machine at the same time of day to check your readings and record them to bring to your follow-up visit.  Please call the office if your BP is consistently above 140/90 and we will make medication adjustments.   Please obtain a copy of your recent lab results and bring them into the office.   Your physician recommends that you schedule a follow-up appointment in: [facility] 2014

## 2012-10-26 NOTE — Assessment & Plan Note (Signed)
The patient had recent labs done, and he says cholesterol is well controlled. We're also like to see his liver functions. Hopefully, these will be ok--he  is going to make a copy and bring it back in.

## 2012-10-26 NOTE — Assessment & Plan Note (Signed)
Patient's well and currently asymptomatic. He is now over a year from his myocardial infarction. We will discontinue his clopidogrel at this point in time. Importantly, the patient had placement of a non-drug-eluting stent at the time of his myocardial infarction due to vessel size.

## 2012-10-26 NOTE — Progress Notes (Signed)
HPI:   The patient is in for followup visit. Cardiac wise he is doing well. He denies any ongoing chest pain or major cardiac symptoms. His blood pressure is been pretty well controlled at home, although is higher today we came in, it was higher the day after Thanksgiving. He's had no unusual bleeding or any other major symptoms.     Current Outpatient Prescriptions  Medication Sig Dispense Refill  . acetaminophen (TYLENOL) 500 MG tablet Take 1,000 mg by mouth every 6 (six) hours as needed. For pain      . aspirin EC 81 MG tablet Take 1 tablet (81 mg total) by mouth daily.  1 tablet  0  . clopidogrel (PLAVIX) 75 MG tablet Take 1 tablet (75 mg total) by mouth daily with breakfast.  30 tablet  6  . losartan (COZAAR) 25 MG tablet TAKE 1 TABLET BY MOUTH IN THE MORNING  30 tablet  5  . metoprolol (LOPRESSOR) 50 MG tablet Take 1 tablet (50 mg total) by mouth 2 (two) times daily.  60 tablet  6  . nitroGLYCERIN (NITROSTAT) 0.4 MG SL tablet Place 0.4 mg under the tongue every 5 (five) minutes as needed.      . pravastatin (PRAVACHOL) 80 MG tablet Take 1 tablet (80 mg total) by mouth every evening.  30 tablet  6  . zolpidem (AMBIEN) 5 MG tablet Take 2.5-5 mg by mouth at bedtime as needed. For sleep        Allergies  Allergen Reactions  . Lisinopril Cough    Past Medical History  Diagnosis Date  . HTN (hypertension)   . HLD (hyperlipidemia)   . Family history of early CAD     Brother had MI at age 72 yo.  . Coronary artery disease     late presentation with ant STEMI 11/12: LHC 10/12/11: oLM 20%, LAD occluded with dissection 2/2 MI.  PCI with BMS to LAD with poor distal runoff; small caliber apical vessel c/w edematous apex from delayed presentation.  . ST elevation myocardial infarction (STEMI) of anterior wall 09/2011    s/p BMS to occluded LAD  . Cardiomyopathy, ischemic 09/2011    EF 40-45%;  echo 10/16/11: Mild LVH, EF 45-50%, very apical akinesis, LV apical thrombus, grade 2 diastolic  dysfunction, trivial MR, PASP 29.  . 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 Elevated LFTs     Abdominal ultrasound was unremarkable  . PSVT (paroxysmal supraventricular tachycardia)     s/p adenocard 12 mg IVP 10/2011    Past Surgical History  Procedure Date  . Cardiac catheterization     ss/p PCI BMS to LAD    Family History  Problem Relation Age of Onset  . Coronary artery disease Brother     Had MI in his 65 yo    History   Social History  . Marital Status: Single    Spouse Name: N/A    Number of Children: N/A  . Years of Education: N/A   Occupational History  . Not on file.   Social History Main Topics  . Smoking status: Never Smoker   . Smokeless tobacco: Never Used  . Alcohol Use: 0.6 oz/week    1 Cans of beer per week  . Drug Use: No  . Sexually Active: Not on file   Other Topics Concern  . Not on file   Social History Narrative  . No narrative on file    ROS: Please see the HPI.  All other systems reviewed and negative.  PHYSICAL EXAM:  BP 142/100  Pulse 67  Ht 5\' 7"  (1.702 m)  Wt 183 lb (83.008 kg)  BMI 28.66 kg/m2  SpO2 99%  General: Well developed, well nourished, in no acute distress. Head:  Normocephalic and atraumatic. Neck: no JVD Lungs: Clear to auscultation and percussion. Heart: Normal S1 and S2.  No murmur, rubs or gallops.  Pulses: Pulses normal in all 4 extremities. Extremities: No clubbing or cyanosis. No edema. Neurologic: Alert and oriented x 3.  EKG:  NSR.  Anterior MI, age indeterminate ASSESSMENT AND PLAN:

## 2012-10-26 NOTE — Assessment & Plan Note (Signed)
BP by me today was 130/85.  He will check at home.  If it remains elevated, then he will call so we can adjust.

## 2012-12-24 IMAGING — CR DG CHEST 1V PORT
1 series · 1 of 1 positions shown · non-contrast
Comparison: None.

CLINICAL DATA: Mid chest pain.

PORTABLE CHEST - 1 VIEW

[AP]
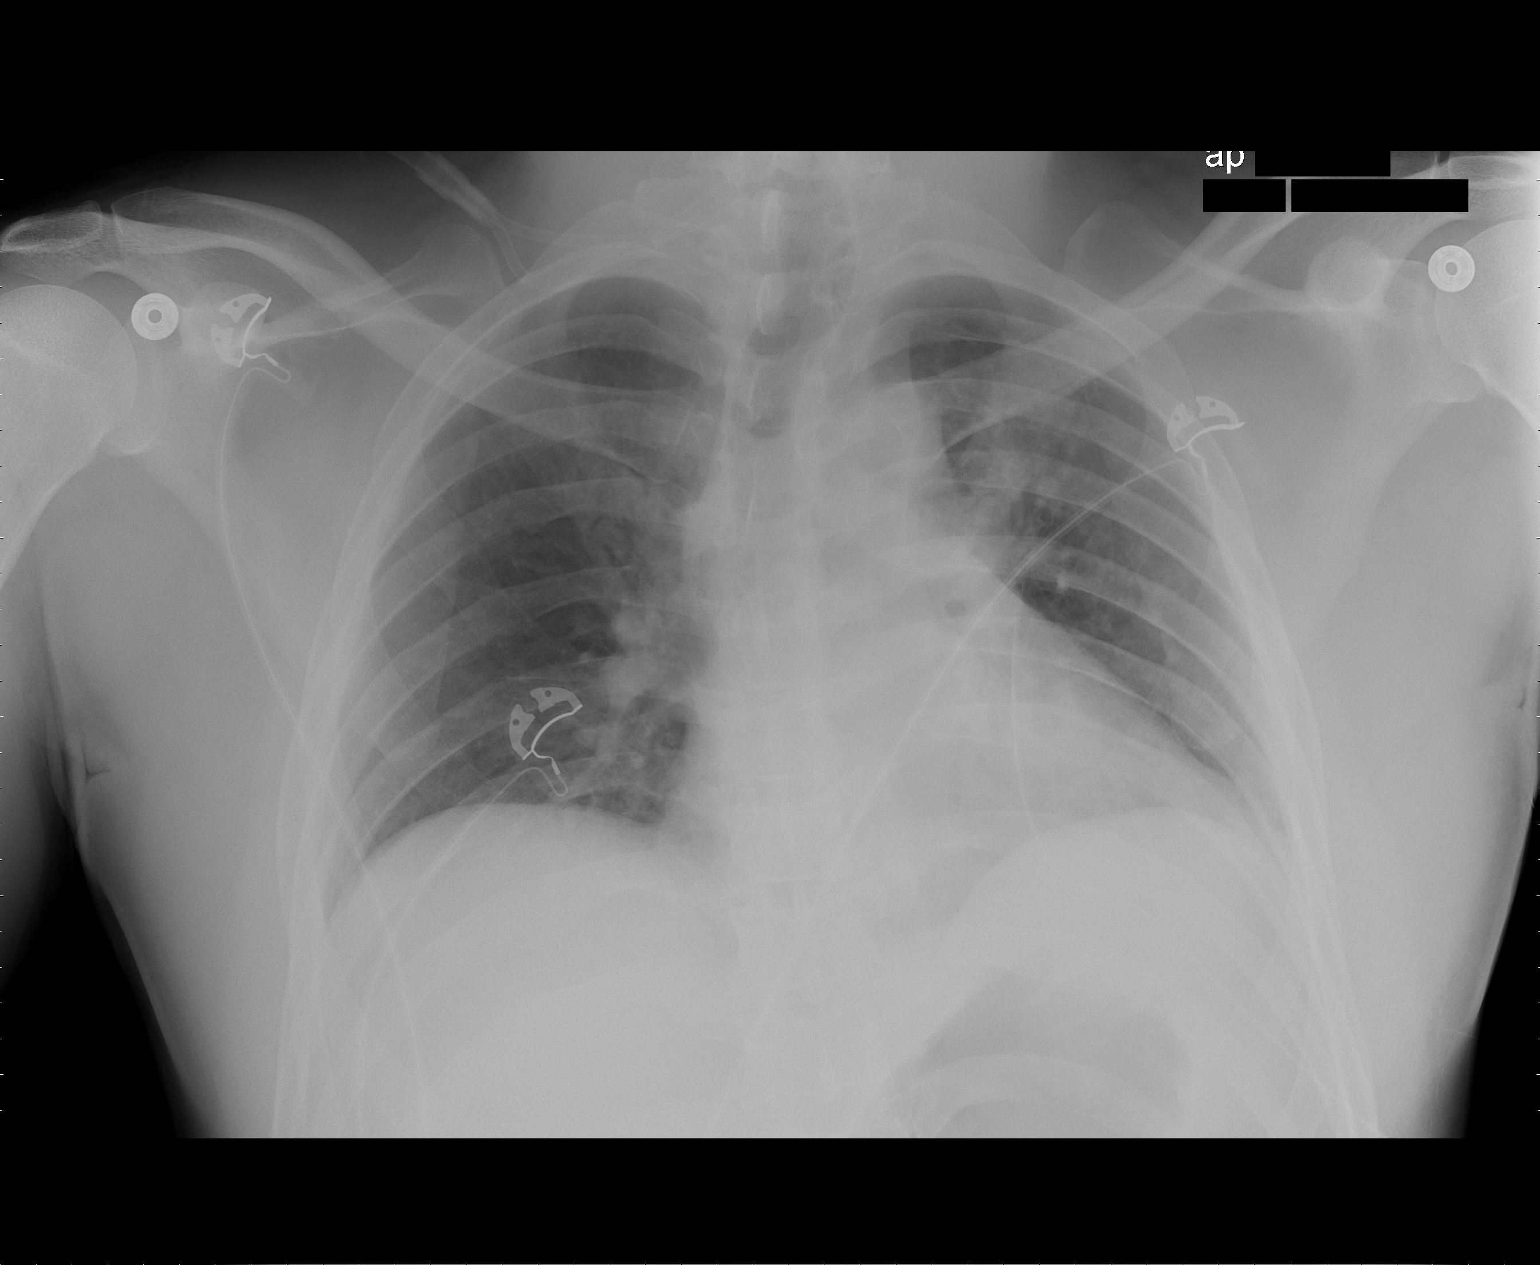

[1 of 1 positions shown; findings below may reference images not displayed]

FINDINGS: The heart size and vascularity are normal and the lungs
are clear.  Cardiac leads and another wire overlie the chest. No
pneumothorax or effusion.
IMPRESSION: No acute abnormalities.

## 2012-12-25 IMAGING — CR DG CHEST 1V PORT
1 series · 1 of 1 positions shown · non-contrast
Comparison: Portable chest x-ray yesterday.

CLINICAL DATA: Fever.

PORTABLE CHEST - 1 VIEW  [DATE]/[id number] hours:

[AP]
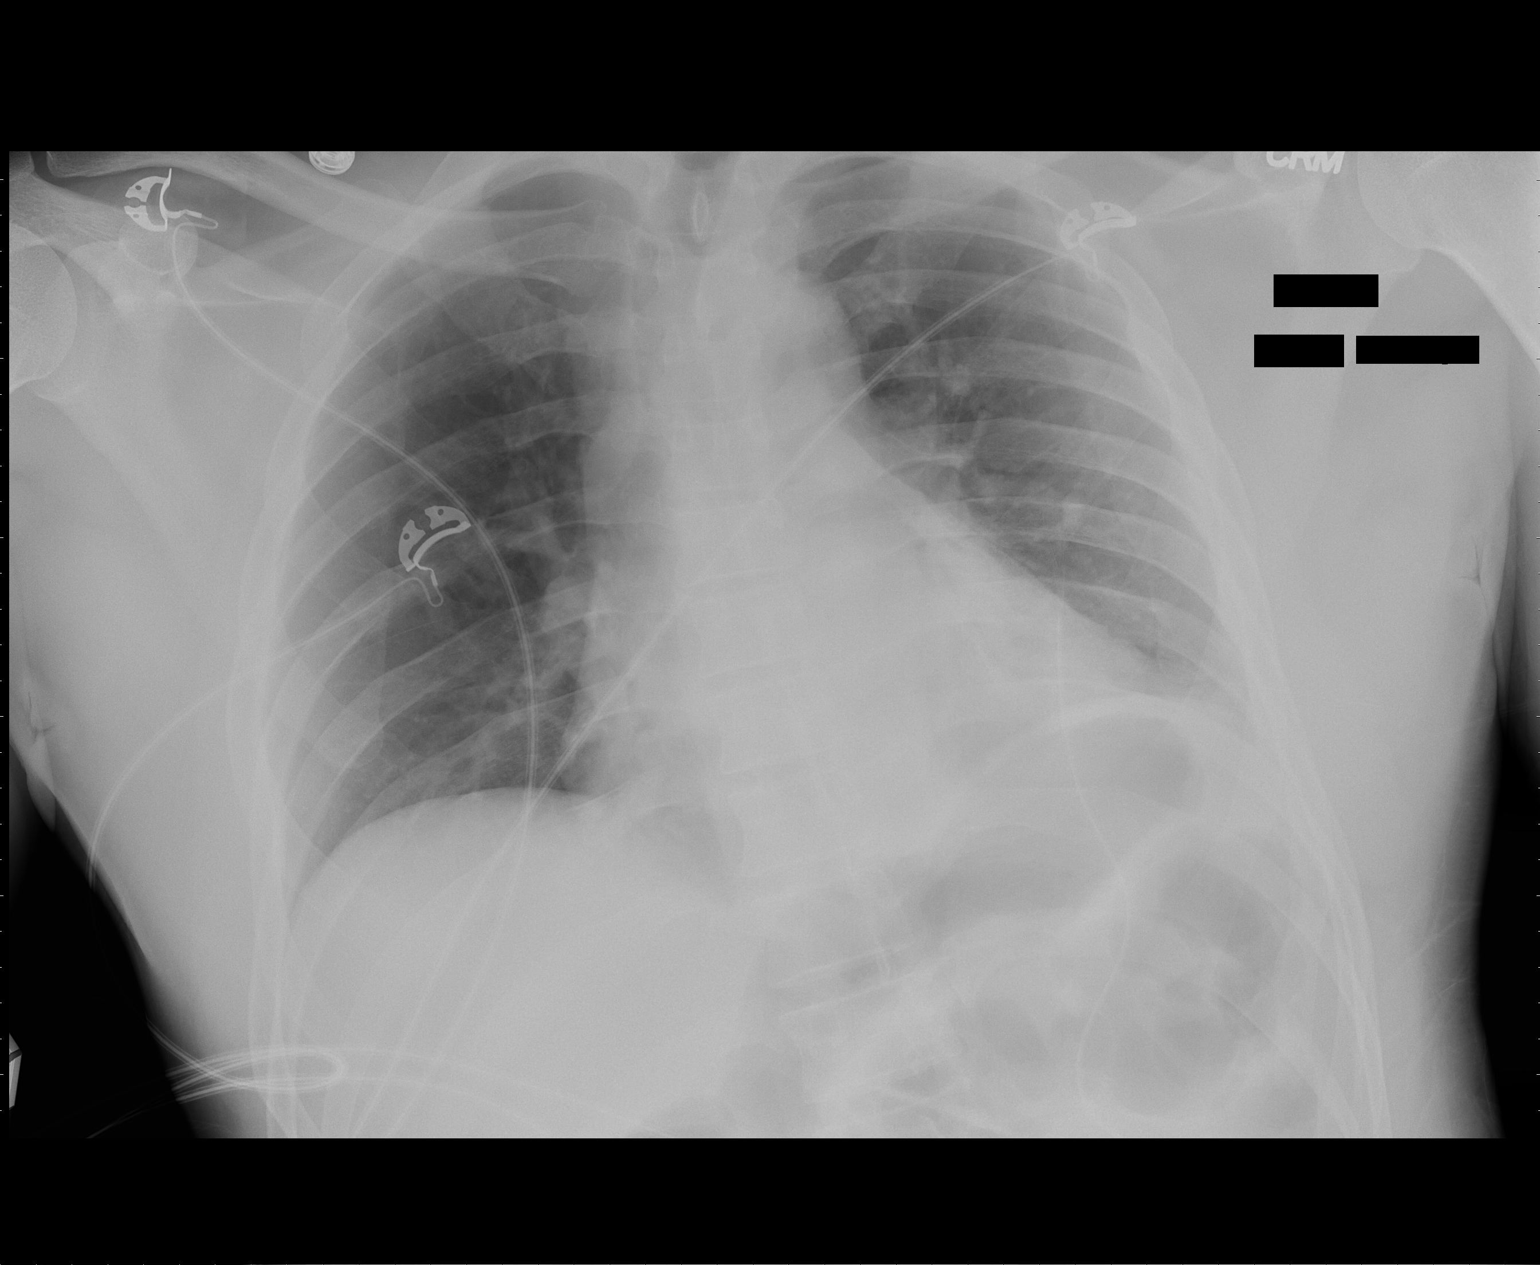

[1 of 1 positions shown; findings below may reference images not displayed]

FINDINGS: Suboptimal inspiration accounts for crowded
bronchovascular markings, especially in the lung bases, and
accentuates the cardiac silhouette.  Taking this into account,
cardiomediastinal silhouette unremarkable and lungs clear.
IMPRESSION: Suboptimal inspiration.  No acute cardiopulmonary disease.

## 2013-01-13 ENCOUNTER — Other Ambulatory Visit: Payer: Self-pay | Admitting: *Deleted

## 2013-01-13 MED ORDER — LOSARTAN POTASSIUM 25 MG PO TABS: 25.0000 mg | ORAL_TABLET | Freq: Every day | ORAL | Status: DC

## 2013-01-13 NOTE — Telephone Encounter (Signed)
Fax Received. Refill Completed. Rodney Marquez (R.M.A)   

## 2013-01-16 ENCOUNTER — Other Ambulatory Visit: Payer: Self-pay | Admitting: *Deleted

## 2013-01-25 ENCOUNTER — Ambulatory Visit (INDEPENDENT_AMBULATORY_CARE_PROVIDER_SITE_OTHER): Payer: BC Managed Care – PPO | Admitting: Cardiology

## 2013-01-25 ENCOUNTER — Ambulatory Visit: Payer: BC Managed Care – PPO | Admitting: Cardiology

## 2013-01-25 ENCOUNTER — Encounter: Payer: Self-pay | Admitting: Cardiology

## 2013-01-25 VITALS — BP 130/92 | HR 56 | Ht 67.0 in | Wt 181.0 lb

## 2013-01-25 DIAGNOSIS — I10 Essential (primary) hypertension: Secondary | ICD-10-CM

## 2013-01-25 DIAGNOSIS — I219 Acute myocardial infarction, unspecified: Secondary | ICD-10-CM

## 2013-01-25 DIAGNOSIS — I236 Thrombosis of atrium, auricular appendage, and ventricle as current complications following acute myocardial infarction: Secondary | ICD-10-CM

## 2013-01-25 DIAGNOSIS — I2510 Atherosclerotic heart disease of native coronary artery without angina pectoris: Secondary | ICD-10-CM

## 2013-01-25 DIAGNOSIS — I238 Other current complications following acute myocardial infarction: Secondary | ICD-10-CM

## 2013-01-25 DIAGNOSIS — E785 Hyperlipidemia, unspecified: Secondary | ICD-10-CM

## 2013-01-25 LAB — HEPATIC FUNCTION PANEL
ALT: 17 U/L (ref 0–53)
AST: 21 U/L (ref 0–37)
Albumin: 3.9 g/dL (ref 3.5–5.2)
Total Bilirubin: 0.6 mg/dL (ref 0.3–1.2)

## 2013-01-25 LAB — LIPID PANEL
HDL: 45 mg/dL (ref >39.00)
Total CHOL/HDL Ratio: 4
Triglycerides: 111 mg/dL (ref 0.0–149.0)
VLDL: 22.2 mg/dL (ref 0.0–40.0)

## 2013-01-25 LAB — BASIC METABOLIC PANEL
Calcium: 9.1 mg/dL (ref 8.4–10.5)
GFR: 89.51 mL/min (ref >60.00)
Potassium: 4 mEq/L (ref 3.5–5.1)
Sodium: 140 mEq/L (ref 135–145)

## 2013-01-25 MED ORDER — LOSARTAN POTASSIUM 50 MG PO TABS: 50.0000 mg | ORAL_TABLET | Freq: Every day | ORAL | Status: DC

## 2013-01-25 NOTE — Assessment & Plan Note (Addendum)
BP is elevated.  Will increase Cozaar or Losartan to 50mg  daily.  He will recheck BP and let us know if change in BP appropriately.  [name] BMET today.

## 2013-01-25 NOTE — Assessment & Plan Note (Signed)
Needs lipid and liver profile.,

## 2013-01-25 NOTE — Progress Notes (Signed)
HPI:  He worked last night but he is doing quite well.  Denies any chest pain.  Feels good overall.  No angina.    Current Outpatient Prescriptions  Medication Sig Dispense Refill  . acetaminophen (TYLENOL) 500 MG tablet Take 1,000 mg by mouth every 6 (six) hours as needed. For pain      . aspirin EC 81 MG tablet Take 1 tablet (81 mg total) by mouth daily.  1 tablet  0  . losartan (COZAAR) 25 MG tablet Take 1 tablet (25 mg total) by mouth daily.  30 tablet  5  . metoprolol (LOPRESSOR) 50 MG tablet Take 1 tablet (50 mg total) by mouth 2 (two) times daily.  60 tablet  6  . nitroGLYCERIN (NITROSTAT) 0.4 MG SL tablet Place 0.4 mg under the tongue every 5 (five) minutes as needed.      . pravastatin (PRAVACHOL) 80 MG tablet Take 1 tablet (80 mg total) by mouth every evening.  30 tablet  6  . zolpidem (AMBIEN) 5 MG tablet Take 2.5-5 mg by mouth at bedtime as needed. For sleep       No current facility-administered medications for this visit.    Allergies  Allergen Reactions  . Lisinopril Cough    Past Medical History  Diagnosis Date  . HTN (hypertension)   . HLD (hyperlipidemia)   . Family history of early CAD     Brother had MI at age 102 yo.  . Coronary artery disease     late presentation with ant STEMI 11/12: LHC 10/12/11: oLM 20%, LAD occluded with dissection 2/2 MI.  PCI with BMS to LAD with poor distal runoff; small caliber apical vessel c/w edematous apex from delayed presentation.  . ST elevation myocardial infarction (STEMI) of anterior wall 09/2011    s/p BMS to occluded LAD  . Cardiomyopathy, ischemic 09/2011    EF 40-45%;  echo 10/16/11: Mild LVH, EF 45-50%, very apical akinesis, LV apical thrombus, grade 2 diastolic dysfunction, trivial MR, PASP 29.  . 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 Elevated LFTs     Abdominal ultrasound was unremarkable  . PSVT (paroxysmal supraventricular tachycardia)     s/p adenocard 12 mg IVP 10/2011    Past  Surgical History  Procedure Laterality Date  . Cardiac catheterization      ss/p PCI BMS to LAD    Family History  Problem Relation Age of Onset  . Coronary artery disease Brother     Had MI in his 26 yo    History   Social History  . Marital Status: Single    Spouse Name: N/A    Number of Children: N/A  . Years of Education: N/A   Occupational History  . Not on file.   Social History Main Topics  . Smoking status: Never Smoker   . Smokeless tobacco: Never Used  . Alcohol Use: 0.6 oz/week    1 Cans of beer per week  . Drug Use: No  . Sexually Active: Not on file   Other Topics Concern  . Not on file   Social History Narrative  . No narrative on file    ROS: Please see the HPI.  All other systems reviewed and negative.  PHYSICAL EXAM:  BP 130/92  Pulse 56  Ht 5\' 7"  (1.702 m)  Wt 181 lb (82.101 kg)  BMI 28.34 kg/m2  SpO2 99%  General: Well developed, well nourished, in no acute distress. Head:  Normocephalic  and atraumatic. Neck: no JVD Lungs: Clear to auscultation and percussion. Heart: Normal S1 and S2.  No murmur, rubs or gallops.  Abdomen:  Normal bowel sounds; soft; non tender; no organomegaly Pulses: Pulses normal in all 4 extremities. Extremities: No clubbing or cyanosis. No edema. Neurologic: Alert and oriented x 3.  EKG:  SB.  Anterior T inversion, similar to prior tracings.  Moderate anterior T inversion.    ASSESSMENT AND PLAN:

## 2013-01-25 NOTE — Assessment & Plan Note (Addendum)
Remains class I-II.  No change in meds.  Had distal LAD dissection with MI and late presentation--stented with small stent.

## 2013-01-25 NOTE — Patient Instructions (Addendum)
Your physician wants you to follow-up in: 6 MONTHS with Dr [name] (previous pt of Dr Riley Kill). You will receive a reminder letter in the mail two months in advance. If you don't receive a letter, please call our office to schedule the follow-up appointment.  Your physician recommends that you have lab work today: LIPID, LIVER and BMP  Your physician has recommended you make the following change in your medication: INCREASE Losartan to 50mg  take one by mouth daily  Your physician has requested that you regularly monitor and record your blood pressure readings at home. Please use the same machine at the same time of day to check your readings and record them to bring to your follow-up visit.

## 2013-01-27 NOTE — Assessment & Plan Note (Signed)
Treated with anticoagulation following discharge.

## 2013-02-02 ENCOUNTER — Encounter: Payer: Self-pay | Admitting: Cardiovascular Disease

## 2013-02-02 NOTE — Telephone Encounter (Signed)
Pt rtn call re test results °

## 2013-02-03 NOTE — Telephone Encounter (Signed)
This encounter was created in error - please disregard.

## 2013-03-13 ENCOUNTER — Other Ambulatory Visit: Payer: Self-pay | Admitting: *Deleted

## 2013-03-13 MED ORDER — PRAVASTATIN SODIUM 80 MG PO TABS: 80.0000 mg | ORAL_TABLET | Freq: Every evening | ORAL | Status: DC

## 2013-03-17 ENCOUNTER — Other Ambulatory Visit: Payer: Self-pay | Admitting: *Deleted

## 2013-04-24 ENCOUNTER — Other Ambulatory Visit: Payer: Self-pay | Admitting: *Deleted

## 2013-04-24 ENCOUNTER — Telehealth: Payer: Self-pay | Admitting: *Deleted

## 2013-04-24 DIAGNOSIS — I2510 Atherosclerotic heart disease of native coronary artery without angina pectoris: Secondary | ICD-10-CM

## 2013-04-24 DIAGNOSIS — E785 Hyperlipidemia, unspecified: Secondary | ICD-10-CM

## 2013-04-24 MED ORDER — METOPROLOL TARTRATE 50 MG PO TABS: 50.0000 mg | ORAL_TABLET | Freq: Two times a day (BID) | ORAL | Status: DC

## 2013-04-24 NOTE — Telephone Encounter (Signed)
Pt called stating he is completely out of meds and would like his meds sent to mailorder. Asked pt if he wanted to have his rx sent to a local pharmacy and he stated he just wanted it sent to [facility].

## 2013-04-24 NOTE — Telephone Encounter (Signed)
Rodney Marquez ','<<< Less Detail       Lipid and liver Marquez  Rodney Marquez  MRN: [id number] DOB: 26-Mar-1966     Pt Work: [phone] Pt Home: [phone]   Sent: Sheral Flow April 24, 2013 8:20 AM     To: [facility] Triage                          Message    Please place Marquez for the the patient's lipid and liver lab draws on 05/02/13 per notes in EPIC left from patient's last lab draw on 01/25/13.   Rodney Marquez ','<<< Less Detail       Lipid and liver Marquez  Rodney Marquez  MRN: [id number] DOB: 12-05-1965     Pt Work: [phone] Pt Home: [phone]   Sent: Sheral Flow April 24, 2013 8:20 AM     To: [facility] Triage                          Message    Please place Marquez for the the patient's lipid and liver lab draws on 05/02/13 per notes in EPIC left from patient's last lab draw on 01/25/13.   Rodney Marquez ','<<< Less Detail       Lipid and liver Marquez  Rodney Marquez  MRN: [id number] DOB: 07-24-1966     Pt Work: [phone] Pt Home: [phone]   Sent: Sheral Flow April 24, 2013 8:20 AM     To: [facility] Triage                          Message    Please place Marquez for the the patient's lipid and liver lab draws on 05/02/13 per notes in EPIC left from patient's last lab draw on 01/25/13.   Rodney Marquez ','<<< Less Detail       Lipid and liver Marquez  Rodney Marquez  MRN: [id number] DOB: 1966-05-28     Pt Work: [phone] Pt Home: [phone]   Sent: Sheral Flow April 24, 2013 8:20 AM     To: [facility] Triage                          Message    Please place Marquez for the the patient's lipid and liver lab draws on 05/02/13 per notes in EPIC left from patient's last lab draw on 01/25/13.

## 2013-05-03 ENCOUNTER — Other Ambulatory Visit (INDEPENDENT_AMBULATORY_CARE_PROVIDER_SITE_OTHER): Payer: BC Managed Care – PPO

## 2013-05-03 DIAGNOSIS — I2510 Atherosclerotic heart disease of native coronary artery without angina pectoris: Secondary | ICD-10-CM

## 2013-05-03 DIAGNOSIS — E785 Hyperlipidemia, unspecified: Secondary | ICD-10-CM

## 2013-05-03 LAB — LIPID PANEL
Cholesterol: 110 mg/dL (ref 0–200)
HDL: 43.1 mg/dL (ref >39.00)
Triglycerides: 51 mg/dL (ref 0.0–149.0)
VLDL: 10.2 mg/dL (ref 0.0–40.0)

## 2013-05-03 LAB — HEPATIC FUNCTION PANEL
ALT: 15 U/L (ref 0–53)
AST: 17 U/L (ref 0–37)
Albumin: 3.9 g/dL (ref 3.5–5.2)
Total Protein: 6.6 g/dL (ref 6.0–8.3)

## 2013-05-04 ENCOUNTER — Telehealth: Payer: Self-pay | Admitting: *Deleted

## 2013-05-04 NOTE — Telephone Encounter (Signed)
Rodney Form, MD     Sent: Wed May 03, 2013  6:06 PM         Message      Lipids and LFTs are ok. cdm    Spoke with pt and reviewed lipid and liver results with him

## 2013-08-02 ENCOUNTER — Ambulatory Visit (INDEPENDENT_AMBULATORY_CARE_PROVIDER_SITE_OTHER): Payer: BC Managed Care – PPO | Admitting: Cardiovascular Disease

## 2013-08-02 ENCOUNTER — Encounter: Payer: Self-pay | Admitting: Cardiovascular Disease

## 2013-08-02 VITALS — BP 130/90 | HR 60 | Ht 67.0 in | Wt 175.0 lb

## 2013-08-02 DIAGNOSIS — E785 Hyperlipidemia, unspecified: Secondary | ICD-10-CM

## 2013-08-02 DIAGNOSIS — I10 Essential (primary) hypertension: Secondary | ICD-10-CM

## 2013-08-02 DIAGNOSIS — I2510 Atherosclerotic heart disease of native coronary artery without angina pectoris: Secondary | ICD-10-CM

## 2013-08-02 MED ORDER — LOSARTAN POTASSIUM 100 MG PO TABS: 100.0000 mg | ORAL_TABLET | Freq: Every day | ORAL | Status: DC

## 2013-08-02 NOTE — Patient Instructions (Signed)
Your physician wants you to follow-up in:  6 months. You will receive a reminder letter in the mail two months in advance. If you don't receive a letter, please call our office to schedule the follow-up appointment.  Your physician has recommended you make the following change in your medication: Increase Cozaar to 100 mg by mouth daily   

## 2013-08-02 NOTE — Progress Notes (Signed)
History of Present Illness: 47 yo with history of CAD, HTN, HLD, SVT, ischemic cardiomyopathy who is here today for cardiac follow up. He has been followed in the past by Dr. Riley Kill. He had an anterior STEMI 10/12/11 and found to have occluded LAD. He had a bare metal stent placed in the LAD. He was treated with anti-coagulation following d/c for LV apical thrombus but has been off since 2013.   He is here today for follow up. He has been feeling well.  No chest pain or SOB. He works full time. BP has been elevated at home.   Primary Care Physician: [name]  Last Lipid Profile:Lipid Panel     Component Value Date/Time   CHOL 110 05/03/2013 0806   TRIG 51.0 05/03/2013 0806   HDL 43.10 05/03/2013 0806   CHOLHDL 3 05/03/2013 0806   VLDL 10.2 05/03/2013 0806   LDLCALC 57 05/03/2013 0806     Past Medical History  Diagnosis Date  . HTN (hypertension)   . HLD (hyperlipidemia)   . Family history of early CAD     Brother had MI at age 8 yo.  . Coronary artery disease     late presentation with ant STEMI 11/12: LHC 10/12/11: oLM 20%, LAD occluded with dissection 2/2 MI.  PCI with BMS to LAD with poor distal runoff; small caliber apical vessel c/w edematous apex from delayed presentation.  . ST elevation myocardial infarction (STEMI) of anterior wall 09/2011    s/p BMS to occluded LAD  . Cardiomyopathy, ischemic 09/2011    EF 40-45%;  echo 10/16/11: Mild LVH, EF 45-50%, very apical akinesis, LV apical thrombus, grade 2 diastolic dysfunction, trivial MR, PASP 29.  . 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 Elevated LFTs     Abdominal ultrasound was unremarkable  . PSVT (paroxysmal supraventricular tachycardia)     s/p adenocard 12 mg IVP 10/2011    Past Surgical History  Procedure Laterality Date  . Wrist surgery      cyst removal   . Leg surgery      cyst removal    Current Outpatient Prescriptions  Medication Sig Dispense Refill  . acetaminophen (TYLENOL) 500  MG tablet Take 1,000 mg by mouth every 6 (six) hours as needed. For pain      . aspirin EC 81 MG tablet Take 1 tablet (81 mg total) by mouth daily.  1 tablet  0  . losartan (COZAAR) 50 MG tablet Take 1 tablet (50 mg total) by mouth daily.  30 tablet  11  . metoprolol (LOPRESSOR) 50 MG tablet Take 1 tablet (50 mg total) by mouth 2 (two) times daily.  180 tablet  3  . nitroGLYCERIN (NITROSTAT) 0.4 MG SL tablet Place 0.4 mg under the tongue every 5 (five) minutes as needed.      . pravastatin (PRAVACHOL) 80 MG tablet Take 1 tablet (80 mg total) by mouth every evening.  30 tablet  6   No current facility-administered medications for this visit.    Allergies  Allergen Reactions  . Lisinopril Cough    History   Social History  . Marital Status: Single    Spouse Name: N/A    Number of Children: N/A  . Years of Education: N/A   Occupational History  . Not on file.   Social History Main Topics  . Smoking status: Never Smoker   . Smokeless tobacco: Never Used  . Alcohol Use: 0.6 oz/week    1 Cans  of beer per week  . Drug Use: No  . Sexual Activity: Not on file   Other Topics Concern  . Not on file   Social History Narrative  . No narrative on file    Family History  Problem Relation Age of Onset  . Coronary artery disease Brother     Had MI in his 46 yo    Review of Systems:  As stated in the HPI and otherwise negative.   BP 130/90  Pulse 60  Ht 5\' 7"  (1.702 m)  Wt 175 lb (79.379 kg)  BMI 27.4 kg/m2  Physical Examination: General: Well developed, well nourished, NAD HEENT: OP clear, mucus membranes moist SKIN: warm, dry. No rashes. Neuro: No focal deficits Musculoskeletal: Muscle strength 5/5 all ext Psychiatric: Mood and affect normal Neck: No JVD, no carotid bruits, no thyromegaly, no lymphadenopathy. Lungs:Clear bilaterally, no wheezes, rhonci, crackles Cardiovascular: Regular rate and rhythm. No murmurs, gallops or rubs. Abdomen:Soft. Bowel sounds present.  Non-tender.  Extremities: No lower extremity edema. Pulses are 2 + in the bilateral DP/PT.  Echo February 2013: Left ventricle: The cavity size was normal. Wall thickness was normal. Systolic function was normal. The estimated ejection fraction was in the range of 60% to 65%. There is akinesis of the apical myocardium. - Mitral valve: Mild regurgitation. - Tricuspid valve: Moderate regurgitation.  Cardiac cath 10/12/11: Left mainstem: The left main coronary artery was without critical narrowing. There was mild ostial narrowing of 20%.  Left anterior descending (LAD): The left anterior descending artery was totally occluded just beyond the origin of the second diagonal branch. The vessel proximal to the site, including the first diagonal and septal, were free of significant disease. At the site of total occlusion, the vessel was dissected with TIMI 0 flow. Following wiring, and balloon dilatation, the vessel was opened demonstrating an infarction related dissection. Despite balloon dilatation, there was very poor distal runoff. A 2 mm x 28 mm non-drug-eluting mini vision stent was placed and post dilated. Following passage of the balloon distally, there was TIMI 2 flow into a small caliber apical vessel. This was most consistent with an edematous apex due to delayed presentation.  Left circumflex (LCx): The circumflex artery consisted of 2 major marginal branches, both of which were free of significant disease.  Right coronary artery (RCA): The right coronary artery was nonselectively injected. It has an anterior takeoff from the left coronary cusp. It will require an AMPLATZ in the future to engage. It was well visualized and provided a posterior descending and posterolateral branch, both of which were free of disease.   Assessment and Plan:   1. CAD: Stable. No change in medications. LV function normal by echo 2/13. Apical hypokinesis. Will repeat echo in march 2015.   2. HLD: On a statin.  Lipids well controlled.     3. HTN: BP is controlled today but has been elevated at home. Will increase Cozaar to 100 mg po Qdaily.

## 2013-08-19 ENCOUNTER — Encounter (HOSPITAL_COMMUNITY): Payer: Self-pay

## 2013-08-19 ENCOUNTER — Emergency Department (HOSPITAL_COMMUNITY)
Admission: EM | Admit: 2013-08-19 | Discharge: 2013-08-19 | Disposition: A | Discharge status: Home / Self Care | Payer: BC Managed Care – PPO | Attending: Emergency Medicine | Admitting: Emergency Medicine

## 2013-08-19 DIAGNOSIS — Z7982 Long term (current) use of aspirin: Secondary | ICD-10-CM | POA: Insufficient documentation

## 2013-08-19 DIAGNOSIS — I10 Essential (primary) hypertension: Secondary | ICD-10-CM | POA: Insufficient documentation

## 2013-08-19 DIAGNOSIS — Z711 Person with feared health complaint in whom no diagnosis is made: Secondary | ICD-10-CM

## 2013-08-19 DIAGNOSIS — I252 Old myocardial infarction: Secondary | ICD-10-CM | POA: Insufficient documentation

## 2013-08-19 DIAGNOSIS — Z113 Encounter for screening for infections with a predominantly sexual mode of transmission: Secondary | ICD-10-CM | POA: Insufficient documentation

## 2013-08-19 DIAGNOSIS — E785 Hyperlipidemia, unspecified: Secondary | ICD-10-CM | POA: Insufficient documentation

## 2013-08-19 DIAGNOSIS — Z79899 Other long term (current) drug therapy: Secondary | ICD-10-CM | POA: Insufficient documentation

## 2013-08-19 DIAGNOSIS — I2510 Atherosclerotic heart disease of native coronary artery without angina pectoris: Secondary | ICD-10-CM | POA: Insufficient documentation

## 2013-08-19 MED ORDER — AZITHROMYCIN 250 MG PO TABS
1000.0000 mg | ORAL_TABLET | Freq: Once | ORAL | Status: AC
  Administered 2013-08-19: 1000 mg via ORAL
  Filled 2013-08-19: qty 4

## 2013-08-19 MED ORDER — CEFTRIAXONE SODIUM 250 MG IJ SOLR
250.0000 mg | Freq: Once | INTRAMUSCULAR | Status: AC
  Administered 2013-08-19: 250 mg via INTRAMUSCULAR
  Filled 2013-08-19: qty 250

## 2013-08-19 NOTE — ED Notes (Signed)
Pt presents for STD check.  Sts his sexual partner for the past 2 months was diagnosed with chlamydia yesterday.  Denies pain.  Denies symptoms.

## 2013-08-19 NOTE — ED Provider Notes (Signed)
CSN: [id number]     Arrival date & time 08/19/13  0809 History   First MD Initiated Contact with Patient 08/19/13 0827     Chief Complaint  Patient presents with  . SEXUALLY TRANSMITTED DISEASE   (Consider location/radiation/quality/duration/timing/severity/associated sxs/prior Treatment) HPI Comments: 47 year old male presents to the emergency department for an STD check. States his partner was diagnosed with Chlamydia yesterday, he has been sexually active with. States she is his only partner. Does not use protection. Denies penile discharge, pain, testicular pain or swelling.  The history is provided by the patient.    Past Medical History  Diagnosis Date  . HTN (hypertension)   . HLD (hyperlipidemia)   . Family history of early CAD     Brother had MI at age 36 yo.  . Coronary artery disease     late presentation with ant STEMI 11/12: LHC 10/12/11: oLM 20%, LAD occluded with dissection 2/2 MI.  PCI with BMS to LAD with poor distal runoff; small caliber apical vessel c/w edematous apex from delayed presentation.  . ST elevation myocardial infarction (STEMI) of anterior wall 09/2011    s/p BMS to occluded LAD  . Cardiomyopathy, ischemic 09/2011    EF 40-45%;  echo 10/16/11: Mild LVH, EF 45-50%, very apical akinesis, LV apical thrombus, grade 2 diastolic dysfunction, trivial MR, PASP 29.  . 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 Elevated LFTs     Abdominal ultrasound was unremarkable  . PSVT (paroxysmal supraventricular tachycardia)     s/p adenocard 12 mg IVP 10/2011   Past Surgical History  Procedure Laterality Date  . Wrist surgery      cyst removal   . Leg surgery      cyst removal   Family History  Problem Relation Age of Onset  . Coronary artery disease Brother     Had MI in his 26 yo   History  Substance Use Topics  . Smoking status: Never Smoker   . Smokeless tobacco: Never Used  . Alcohol Use: 0.6 oz/week    1 Cans of beer per week     Review of Systems  Constitutional: Negative for fever and chills.  Gastrointestinal: Negative for nausea, vomiting and abdominal pain.  Genitourinary: Negative for penile swelling, scrotal swelling, penile pain and testicular pain.  Musculoskeletal: Negative for back pain.  Skin: Negative for rash.    Allergies  Lisinopril  Home Medications   Current Outpatient Rx  Name  Route  Sig  Dispense  Refill  . aspirin EC 81 MG tablet   Oral   Take 1 tablet (81 mg total) by mouth daily.   1 tablet   0   . losartan (COZAAR) 100 MG tablet   Oral   Take 1 tablet (100 mg total) by mouth daily.   30 tablet   11   . metoprolol (LOPRESSOR) 50 MG tablet   Oral   Take 1 tablet (50 mg total) by mouth 2 (two) times daily.   180 tablet   3   . nitroGLYCERIN (NITROSTAT) 0.4 MG SL tablet   Sublingual   Place 0.4 mg under the tongue every 5 (five) minutes as needed for chest pain.          . pravastatin (PRAVACHOL) 80 MG tablet   Oral   Take 1 tablet (80 mg total) by mouth every evening.   30 tablet   6    BP 138/84  Pulse 61  Temp(Src) 98 F (36.7  C) (Oral)  Resp 16  SpO2 95% Physical Exam  Nursing note and vitals reviewed. Constitutional: He is oriented to person, place, and time. He appears well-developed and well-nourished. No distress.  HENT:  Head: Normocephalic and atraumatic.  Mouth/Throat: Oropharynx is clear and moist.  Eyes: Conjunctivae are normal.  Neck: Normal range of motion. Neck supple.  Cardiovascular: Normal rate, regular rhythm and normal heart sounds.   Pulmonary/Chest: Effort normal and breath sounds normal.  Genitourinary: Right testis shows no mass, no swelling and no tenderness. Left testis shows no mass, no swelling and no tenderness. Circumcised. No discharge found.  Musculoskeletal: Normal range of motion. He exhibits no edema.  Neurological: He is alert and oriented to person, place, and time.  Skin: Skin is warm and dry. He is not  diaphoretic.  Psychiatric: He has a normal mood and affect. His behavior is normal.    ED Course  Procedures (including critical care time) Labs Review Labs Reviewed  GC/CHLAMYDIA PROBE AMP   Imaging Review No results found.  MDM   1. Concern about STD in male without diagnosis    GC/Chlamydia cultures obtained. 1 g azithromycin 250 g IM Rocephin given.    Trevor Mace, PA-C 08/19/13 [phone]

## 2013-08-21 ENCOUNTER — Telehealth (HOSPITAL_COMMUNITY): Payer: Self-pay | Admitting: Emergency Medicine

## 2013-08-21 LAB — GC/CHLAMYDIA PROBE AMP
CT Probe RNA: POSITIVE — AB
GC Probe RNA: NEGATIVE

## 2013-08-22 NOTE — ED Notes (Signed)
+   Chlamydia Patient treated with Rocephin And Zithromax-[facility] faxed 

## 2013-08-22 NOTE — ED Notes (Signed)
Patient informed of positive results after id'd x 2 and informed of need to notify partner to be treated. 

## 2013-08-22 NOTE — ED Provider Notes (Signed)
Medical screening examination/treatment/procedure(s) were performed by non-physician practitioner and as supervising physician I was immediately available for consultation/collaboration.   Suzi Roots, MD 08/22/13 [phone]

## 2013-12-19 ENCOUNTER — Other Ambulatory Visit: Payer: Self-pay | Admitting: Cardiology

## 2014-01-18 ENCOUNTER — Emergency Department (HOSPITAL_COMMUNITY)
Admission: EM | Admit: 2014-01-18 | Discharge: 2014-01-18 | Disposition: A | Discharge status: Home / Self Care | Payer: BC Managed Care – PPO | Attending: Emergency Medicine | Admitting: Emergency Medicine

## 2014-01-18 ENCOUNTER — Emergency Department (HOSPITAL_COMMUNITY): Payer: BC Managed Care – PPO

## 2014-01-18 ENCOUNTER — Encounter (HOSPITAL_COMMUNITY): Payer: Self-pay | Admitting: Emergency Medicine

## 2014-01-18 DIAGNOSIS — I10 Essential (primary) hypertension: Secondary | ICD-10-CM | POA: Insufficient documentation

## 2014-01-18 DIAGNOSIS — Z7982 Long term (current) use of aspirin: Secondary | ICD-10-CM | POA: Insufficient documentation

## 2014-01-18 DIAGNOSIS — E860 Dehydration: Secondary | ICD-10-CM | POA: Insufficient documentation

## 2014-01-18 DIAGNOSIS — E785 Hyperlipidemia, unspecified: Secondary | ICD-10-CM | POA: Insufficient documentation

## 2014-01-18 DIAGNOSIS — I252 Old myocardial infarction: Secondary | ICD-10-CM | POA: Insufficient documentation

## 2014-01-18 DIAGNOSIS — R739 Hyperglycemia, unspecified: Secondary | ICD-10-CM

## 2014-01-18 DIAGNOSIS — R7309 Other abnormal glucose: Secondary | ICD-10-CM | POA: Insufficient documentation

## 2014-01-18 DIAGNOSIS — R5383 Other fatigue: Secondary | ICD-10-CM

## 2014-01-18 DIAGNOSIS — R5381 Other malaise: Secondary | ICD-10-CM | POA: Insufficient documentation

## 2014-01-18 DIAGNOSIS — I2510 Atherosclerotic heart disease of native coronary artery without angina pectoris: Secondary | ICD-10-CM | POA: Insufficient documentation

## 2014-01-18 DIAGNOSIS — Z79899 Other long term (current) drug therapy: Secondary | ICD-10-CM | POA: Insufficient documentation

## 2014-01-18 LAB — CBC
HEMATOCRIT: 42.4 % (ref 39.0–52.0)
Hemoglobin: 15.2 g/dL (ref 13.0–17.0)
MCH: 33 pg (ref 26.0–34.0)
MCHC: 35.8 g/dL (ref 30.0–36.0)
MCV: 92.2 fL (ref 78.0–100.0)
PLATELETS: 223 10*3/uL (ref 150–400)
RBC: 4.6 MIL/uL (ref 4.22–5.81)
RDW: 13.1 % (ref 11.5–15.5)
WBC: 5.7 10*3/uL (ref 4.0–10.5)

## 2014-01-18 LAB — URINALYSIS, ROUTINE W REFLEX MICROSCOPIC
BILIRUBIN URINE: NEGATIVE
Glucose, UA: NEGATIVE mg/dL
Hgb urine dipstick: NEGATIVE
KETONES UR: NEGATIVE mg/dL
Leukocytes, UA: NEGATIVE
Nitrite: NEGATIVE
Protein, ur: NEGATIVE mg/dL
Specific Gravity, Urine: 1.017 (ref 1.005–1.030)
UROBILINOGEN UA: 0.2 mg/dL (ref 0.0–1.0)
pH: 5.5 (ref 5.0–8.0)

## 2014-01-18 LAB — BASIC METABOLIC PANEL
BUN: 11 mg/dL (ref 6–23)
CHLORIDE: 100 meq/L (ref 96–112)
CO2: 25 mEq/L (ref 19–32)
CREATININE: 1.23 mg/dL (ref 0.50–1.35)
Calcium: 9.4 mg/dL (ref 8.4–10.5)
GFR calc Af Amer: 80 mL/min — ABNORMAL LOW (ref >90)
GFR calc non Af Amer: 69 mL/min — ABNORMAL LOW (ref >90)
Glucose, Bld: 124 mg/dL — ABNORMAL HIGH (ref 70–99)
Potassium: 3.8 mEq/L (ref 3.7–5.3)
Sodium: 141 mEq/L (ref 137–147)

## 2014-01-18 LAB — I-STAT TROPONIN, ED: Troponin i, poc: 0.01 ng/mL (ref 0.00–0.08)

## 2014-01-18 LAB — PRO B NATRIURETIC PEPTIDE: Pro B Natriuretic peptide (BNP): 98.4 pg/mL (ref 0–125)

## 2014-01-18 MED ORDER — SODIUM CHLORIDE 0.9 % IV BOLUS (SEPSIS)
1000.0000 mL | Freq: Once | INTRAVENOUS | Status: AC
  Administered 2014-01-18: 1000 mL via INTRAVENOUS

## 2014-01-18 NOTE — ED Notes (Signed)
Patient transported to X-ray 

## 2014-01-18 NOTE — ED Notes (Signed)
Pt placed back on cardiac monitor.

## 2014-01-18 NOTE — Discharge Instructions (Signed)
Dehydration, Adult Dehydration is when you lose more fluids from the body than you take in. Vital organs like the kidneys, brain, and heart cannot function without a proper amount of fluids and salt. Any loss of fluids from the body can cause dehydration.  CAUSES   Vomiting.  Diarrhea.  Excessive sweating.  Excessive urine output.  Fever. SYMPTOMS  Mild dehydration  Thirst.  Dry lips.  Slightly dry mouth. Moderate dehydration  Very dry mouth.  Sunken eyes.  Skin does not bounce back quickly when lightly pinched and released.  Dark urine and decreased urine production.  Decreased tear production.  Headache. Severe dehydration  Very dry mouth.  Extreme thirst.  Rapid, weak pulse (more than 100 beats per minute at rest).  Cold hands and feet.  Not able to sweat in spite of heat and temperature.  Rapid breathing.  Blue lips.  Confusion and lethargy.  Difficulty being awakened.  Minimal urine production.  No tears. DIAGNOSIS  Your caregiver will diagnose dehydration based on your symptoms and your exam. Blood and urine tests will help confirm the diagnosis. The diagnostic evaluation should also identify the cause of dehydration. TREATMENT  Treatment of mild or moderate dehydration can often be done at home by increasing the amount of fluids that you drink. It is best to drink small amounts of fluid more often. Drinking too much at one time can make vomiting worse. Refer to the home care instructions below. Severe dehydration needs to be treated at the hospital where you will probably be given intravenous (IV) fluids that contain water and electrolytes. HOME CARE INSTRUCTIONS   Ask your caregiver about specific rehydration instructions.  Drink enough fluids to keep your urine clear or pale yellow.  Drink small amounts frequently if you have nausea and vomiting.  Eat as you normally do.  Avoid:  Foods or drinks high in sugar.  Carbonated  drinks.  Juice.  Extremely hot or cold fluids.  Drinks with caffeine.  Fatty, greasy foods.  Alcohol.  Tobacco.  Overeating.  Gelatin desserts.  Wash your hands well to avoid spreading bacteria and viruses.  Only take over-the-counter or prescription medicines for pain, discomfort, or fever as directed by your caregiver.  Ask your caregiver if you should continue all prescribed and over-the-counter medicines.  Keep all follow-up appointments with your caregiver. SEEK MEDICAL CARE IF:  You have abdominal pain and it increases or stays in one area (localizes).  You have a rash, stiff neck, or severe headache.  You are irritable, sleepy, or difficult to awaken.  You are weak, dizzy, or extremely thirsty. SEEK IMMEDIATE MEDICAL CARE IF:   You are unable to keep fluids down or you get worse despite treatment.  You have frequent episodes of vomiting or diarrhea.  You have blood or green matter (bile) in your vomit.  You have blood in your stool or your stool looks black and tarry.  You have not urinated in 6 to 8 hours, or you have only urinated a small amount of very dark urine.  You have a fever.  You faint. MAKE SURE YOU:   Understand these instructions.  Will watch your condition.  Will get help right away if you are not doing well or get worse. Document Released: 11/09/2005 Document Revised: 02/01/2012 Document Reviewed: 06/29/2011 [facility] Patient Information 2014 [place], [place]. Fatigue Fatigue is a feeling of tiredness, lack of energy, lack of motivation, or feeling tired all the time. Having enough rest, good nutrition, and reducing stress will  normally reduce fatigue. Consult your caregiver if it persists. The nature of your fatigue will help your caregiver to find out its cause. The treatment is based on the cause.  CAUSES  There are many causes for fatigue. Most of the time, fatigue can be traced to one or more of your habits or routines. Most  causes fit into one or more of three general areas. They are: Lifestyle problems  Sleep disturbances.  Overwork.  Physical exertion.  Unhealthy habits.  Poor eating habits or eating disorders.  Alcohol and/or drug use .  Lack of proper nutrition (malnutrition). Psychological problems  Stress and/or anxiety problems.  Depression.  Grief.  Boredom. Medical Problems or Conditions  Anemia.  Pregnancy.  Thyroid gland problems.  Recovery from major surgery.  Continuous pain.  Emphysema or asthma that is not well controlled  Allergic conditions.  Diabetes.  Infections (such as mononucleosis).  Obesity.  Sleep disorders, such as sleep apnea.  Heart failure or other heart-related problems.  Cancer.  Kidney disease.  Liver disease.  Effects of certain medicines such as antihistamines, cough and cold remedies, prescription pain medicines, heart and blood pressure medicines, drugs used for treatment of cancer, and some antidepressants. SYMPTOMS  The symptoms of fatigue include:   Lack of energy.  Lack of drive (motivation).  Drowsiness.  Feeling of indifference to the surroundings. DIAGNOSIS  The details of how you feel help guide your caregiver in finding out what is causing the fatigue. You will be asked about your present and past health condition. It is important to review all medicines that you take, including prescription and non-prescription items. A thorough exam will be done. You will be questioned about your feelings, habits, and normal lifestyle. Your caregiver may suggest blood tests, urine tests, or other tests to look for common medical causes of fatigue.  TREATMENT  Fatigue is treated by correcting the underlying cause. For example, if you have continuous pain or depression, treating these causes will improve how you feel. Similarly, adjusting the dose of certain medicines will help in reducing fatigue.  HOME CARE INSTRUCTIONS   Try to get  the required amount of good sleep every night.  Eat a healthy and nutritious diet, and drink enough water throughout the day.  Practice ways of relaxing (including yoga or meditation).  Exercise regularly.  Make plans to change situations that cause stress. Act on those plans so that stresses decrease over time. Keep your work and personal routine reasonable.  Avoid street drugs and minimize use of alcohol.  Start taking a daily multivitamin after consulting your caregiver. SEEK MEDICAL CARE IF:   You have persistent tiredness, which cannot be accounted for.  You have fever.  You have unintentional weight loss.  You have headaches.  You have disturbed sleep throughout the night.  You are feeling sad.  You have constipation.  You have dry skin.  You have gained weight.  You are taking any new or different medicines that you suspect are causing fatigue.  You are unable to sleep at night.  You develop any unusual swelling of your legs or other parts of your body. SEEK IMMEDIATE MEDICAL CARE IF:   You are feeling confused.  Your vision is blurred.  You feel faint or pass out.  You develop severe headache.  You develop severe abdominal, pelvic, or back pain.  You develop chest pain, shortness of breath, or an irregular or fast heartbeat.  You are unable to pass a normal amount of urine.  You develop abnormal bleeding such as bleeding from the rectum or you vomit blood.  You have thoughts about harming yourself or committing suicide.  You are worried that you might harm someone else. MAKE SURE YOU:   Understand these instructions.  Will watch your condition.  Will get help right away if you are not doing well or get worse. Document Released: 09/06/2007 Document Revised: 02/01/2012 Document Reviewed: 09/06/2007 [facility] Patient Information 2014 [place], [place]. Hyperglycemia Hyperglycemia occurs when the glucose (sugar) in your blood is too high.  Hyperglycemia can happen for many reasons, but it most often happens to people who do not know they have diabetes or are not managing their diabetes properly.  CAUSES  Whether you have diabetes or not, there are other causes of hyperglycemia. Hyperglycemia can occur when you have diabetes, but it can also occur in other situations that you might not be as aware of, such as: Diabetes  If you have diabetes and are having problems controlling your blood glucose, hyperglycemia could occur because of some of the following reasons:  Not following your meal plan.  Not taking your diabetes medications or not taking it properly.  Exercising less or doing less activity than you normally do.  Being sick. Pre-diabetes  This cannot be ignored. Before people develop Type 2 diabetes, they almost always have "pre-diabetes." This is when your blood glucose levels are higher than normal, but not yet high enough to be diagnosed as diabetes. Research has shown that some long-term damage to the body, especially the heart and circulatory system, may already be occurring during pre-diabetes. If you take action to manage your blood glucose when you have pre-diabetes, you may delay or prevent Type 2 diabetes from developing. Stress  If you have diabetes, you may be "diet" controlled or on oral medications or insulin to control your diabetes. However, you may find that your blood glucose is higher than usual in the hospital whether you have diabetes or not. This is often referred to as "stress hyperglycemia." Stress can elevate your blood glucose. This happens because of hormones put out by the body during times of stress. If stress has been the cause of your high blood glucose, it can be followed regularly by your caregiver. That way he/she can make sure your hyperglycemia does not continue to get worse or progress to diabetes. Steroids  Steroids are medications that act on the infection fighting system (immune system)  to block inflammation or infection. One side effect can be a rise in blood glucose. Most people can produce enough extra insulin to allow for this rise, but for those who cannot, steroids make blood glucose levels go even higher. It is not unusual for steroid treatments to "uncover" diabetes that is developing. It is not always possible to determine if the hyperglycemia will go away after the steroids are stopped. A special blood test called an A1c is sometimes done to determine if your blood glucose was elevated before the steroids were started. SYMPTOMS  Thirsty.  Frequent urination.  Dry mouth.  Blurred vision.  Tired or fatigue.  Weakness.  Sleepy.  Tingling in feet or leg. DIAGNOSIS  Diagnosis is made by monitoring blood glucose in one or all of the following ways:  A1c test. This is a chemical found in your blood.  Fingerstick blood glucose monitoring.  Laboratory results. TREATMENT  First, knowing the cause of the hyperglycemia is important before the hyperglycemia can be treated. Treatment may include, but is not be limited to:  Education.  Change or adjustment in medications.  Change or adjustment in meal plan.  Treatment for an illness, infection, etc.  More frequent blood glucose monitoring.  Change in exercise plan.  Decreasing or stopping steroids.  Lifestyle changes. HOME CARE INSTRUCTIONS   Test your blood glucose as directed.  Exercise regularly. Your caregiver will give you instructions about exercise. Pre-diabetes or diabetes which comes on with stress is helped by exercising.  Eat wholesome, balanced meals. Eat often and at regular, fixed times. Your caregiver or nutritionist will give you a meal plan to guide your sugar intake.  Being at an ideal weight is important. If needed, losing as little as 10 to 15 pounds may help improve blood glucose levels. SEEK MEDICAL CARE IF:   You have questions about medicine, activity, or diet.  You  continue to have symptoms (problems such as increased thirst, urination, or weight gain). SEEK IMMEDIATE MEDICAL CARE IF:   You are vomiting or have diarrhea.  Your breath smells fruity.  You are breathing faster or slower.  You are very sleepy or incoherent.  You have numbness, tingling, or pain in your feet or hands.  You have chest pain.  Your symptoms get worse even though you have been following your caregiver's orders.  If you have any other questions or concerns. Document Released: 05/05/2001 Document Revised: 02/01/2012 Document Reviewed: 03/07/2012 [facility] Patient Information 2014 [place].

## 2014-01-18 NOTE — ED Notes (Signed)
Pt c/o SOB x 2 days with chills. Denies cough, or CP, or pain.

## 2014-01-18 NOTE — ED Provider Notes (Signed)
CSN: [id number]     Arrival date & time 01/18/14  0500 History   First MD Initiated Contact with Patient 01/18/14 0510     Chief Complaint  Patient presents with  . Shortness of Breath     (Consider location/radiation/quality/duration/timing/severity/associated sxs/prior Treatment) Patient is a 48 y.o. male presenting with shortness of breath.  Shortness of Breath Associated symptoms: no abdominal pain, no chest pain, no cough, no fever, no headaches, no rash and no vomiting     This is a 48 year old male with history of hypertension, hyperlipidemia, coronary artery disease who presents with generalized fatigue. Patient states he just feels tired. He reports symptoms since yesterday. He works third shift but states that he just had difficulty feeling rested and getting a sleep. He states he gets short of breath "sometimes." He states it is usually with walking or lying flat. He denies any chest pain. He denies any leg swelling. He denies any history of blood clots, recent hospitalizations, recent travel.  He denies any recent weight gain. He denies any other symptoms including fever, cough, abdominal pain, nausea, vomiting. He denies any chills or sick contacts.  Past Medical History  Diagnosis Date  . HTN (hypertension)   . HLD (hyperlipidemia)   . Family history of early CAD     Brother had MI at age 46 yo.  . Coronary artery disease     late presentation with ant STEMI 11/12: LHC 10/12/11: oLM 20%, LAD occluded with dissection 2/2 MI.  PCI with BMS to LAD with poor distal runoff; small caliber apical vessel c/w edematous apex from delayed presentation.  . ST elevation myocardial infarction (STEMI) of anterior wall 09/2011    s/p BMS to occluded LAD  . Cardiomyopathy, ischemic 09/2011    EF 40-45%;  echo 10/16/11: Mild LVH, EF 45-50%, very apical akinesis, LV apical thrombus, grade 2 diastolic dysfunction, trivial MR, PASP 29.  . LV (left ventricular) mural thrombus following MI  09/2011  . Elevated LFTs     Abdominal ultrasound was unremarkable  . PSVT (paroxysmal supraventricular tachycardia)     s/p adenocard 12 mg IVP 10/2011   Past Surgical History  Procedure Laterality Date  . Wrist surgery      cyst removal   . Leg surgery      cyst removal   Family History  Problem Relation Age of Onset  . Coronary artery disease Brother     Had MI in his 46 yo   History  Substance Use Topics  . Smoking status: Never Smoker   . Smokeless tobacco: Never Used  . Alcohol Use: 0.6 oz/week    1 Cans of beer per week    Review of Systems  Constitutional: Positive for fatigue. Negative for fever and chills.  Respiratory: Positive for shortness of breath. Negative for cough and chest tightness.   Cardiovascular: Negative.  Negative for chest pain and leg swelling.  Gastrointestinal: Negative.  Negative for nausea, vomiting, abdominal pain and diarrhea.  Endocrine: Negative for cold intolerance.  Genitourinary: Negative.  Negative for dysuria.  Musculoskeletal: Negative for back pain.  Skin: Negative for rash.  Neurological: Negative for headaches.  All other systems reviewed and are negative.      Allergies  Lisinopril  Home Medications   Current Outpatient Rx  Name  Route  Sig  Dispense  Refill  . aspirin EC 81 MG tablet   Oral   Take 1 tablet (81 mg total) by mouth daily.   1  tablet   0   . IBUPROFEN PO   Oral   Take 1 tablet by mouth once.         [name] losartan (COZAAR) 100 MG tablet   Oral   Take 1 tablet (100 mg total) by mouth daily.   30 tablet   11   . metoprolol (LOPRESSOR) 50 MG tablet   Oral   Take 1 tablet (50 mg total) by mouth 2 (two) times daily.   180 tablet   3   . nitroGLYCERIN (NITROSTAT) 0.4 MG SL tablet   Sublingual   Place 0.4 mg under the tongue every 5 (five) minutes as needed for chest pain.          . pravastatin (PRAVACHOL) 80 MG tablet   Oral   Take 80 mg by mouth daily.          BP 138/86  Pulse  81  Temp(Src) 98.7 F (37.1 C) (Oral)  Resp 18  Ht 5\' 7"  (1.702 m)  Wt 178 lb (80.74 kg)  BMI 27.87 kg/m2  SpO2 100% Physical Exam  Nursing note and vitals reviewed. Constitutional: He is oriented to person, place, and time. No distress.  HENT:  Head: Normocephalic and atraumatic.  Mouth/Throat: Oropharynx is clear and moist.  Eyes: Pupils are equal, round, and reactive to light.  Neck: Neck supple.  Cardiovascular: Normal rate, regular rhythm and normal heart sounds.   No murmur heard. Pulmonary/Chest: Effort normal and breath sounds normal. No respiratory distress. He has no wheezes. He has no rales.  Abdominal: Soft. Bowel sounds are normal. There is no tenderness. There is no rebound.  Musculoskeletal: He exhibits no edema.  Lymphadenopathy:    He has no cervical adenopathy.  Neurological: He is alert and oriented to person, place, and time.  Skin: Skin is warm and dry.  Psychiatric:  Flat affect    ED Course  Procedures (including critical care time) Labs Review Labs Reviewed  BASIC METABOLIC PANEL - Abnormal; Notable for the following:    Glucose, Bld 124 (*)    GFR calc non Af Amer 69 (*)    GFR calc Af Amer 80 (*)    All other components within normal limits  CBC  PRO B NATRIURETIC PEPTIDE  URINALYSIS, ROUTINE W REFLEX MICROSCOPIC  I-STAT TROPOININ, ED   Imaging Review Dg Chest 2 View  01/18/2014   CLINICAL DATA:  Fatigue and shortness of Breath  EXAM: CHEST  2 VIEW  COMPARISON:  Prior radiograph from 04/21/2012  FINDINGS: The cardiac and mediastinal silhouettes are stable in size and contour, and remain within normal limits.  The lungs are normally inflated. No airspace consolidation, pleural effusion, or pulmonary edema is identified. There is no pneumothorax.  No acute osseous abnormality identified.  IMPRESSION: No active cardiopulmonary disease.   Electronically Signed   By: Rise Mu M.D.   On: 01/18/2014 06:11    EKG Interpretation     Date/Time:  Thursday January 18 2014 05:03:07 EST Ventricular Rate:  85 PR Interval:  124 QRS Duration: 78 QT Interval:  330 QTC Calculation: 392 R Axis:   -100 Text Interpretation:  Normal sinus rhythm Right superior axis deviation Inferior infarct , age undetermined Anterior infarct , age undetermined T wave abnormality, consider lateral ischemia Abnormal ECG No significant change since last tracing Confirmed by Teira Arcilla  MD, Ileigh Mettler ([id number]) on 01/18/2014 5:09:56 AM            MDM   Final diagnoses:  Fatigue  Dehydration  Hyperglycemia    Patient presents with generalized fatigue. he otherwise has no other complaints.  He is nontoxic and afebrile on exam. Vital signs are reassuring. Screening lab work was obtained. He appears mildly dehydrated and was given fluids. Glucose Is 124. No history diabetes. Discussed with patient that hyperglycemia and diabetes can sometimes present with vague symptoms including fatigue. Patient denies any polysdipsia or polyuria.   Patient does have a primary care physician. Patient encouraged followup with primary care physician for repeat testing. Patient given strict return precautions.  After history, exam, and medical workup I feel the patient has been appropriately medically screened and is safe for discharge home. Pertinent diagnoses were discussed with the patient. Patient was given return precautions.     Shon Baton, MD 01/19/14 [phone]

## 2014-02-28 ENCOUNTER — Other Ambulatory Visit: Payer: Self-pay | Admitting: Cardiovascular Disease

## 2014-03-12 ENCOUNTER — Other Ambulatory Visit: Payer: Self-pay | Admitting: Cardiology

## 2014-05-11 ENCOUNTER — Other Ambulatory Visit: Payer: Self-pay | Admitting: Cardiovascular Disease

## 2014-05-23 ENCOUNTER — Other Ambulatory Visit: Payer: Self-pay | Admitting: Cardiovascular Disease

## 2014-07-22 ENCOUNTER — Other Ambulatory Visit: Payer: Self-pay | Admitting: Cardiovascular Disease

## 2014-08-03 ENCOUNTER — Other Ambulatory Visit: Payer: Self-pay | Admitting: Cardiovascular Disease

## 2014-08-17 ENCOUNTER — Other Ambulatory Visit: Payer: Self-pay | Admitting: Cardiovascular Disease

## 2014-08-21 ENCOUNTER — Encounter (HOSPITAL_COMMUNITY): Payer: Self-pay | Admitting: Emergency Medicine

## 2014-08-21 ENCOUNTER — Emergency Department (HOSPITAL_COMMUNITY)
Admission: EM | Admit: 2014-08-21 | Discharge: 2014-08-21 | Disposition: A | Discharge status: Home / Self Care | Payer: BC Managed Care – PPO | Attending: Emergency Medicine | Admitting: Emergency Medicine

## 2014-08-21 DIAGNOSIS — Z202 Contact with and (suspected) exposure to infections with a predominantly sexual mode of transmission: Secondary | ICD-10-CM | POA: Diagnosis present

## 2014-08-21 DIAGNOSIS — Z7982 Long term (current) use of aspirin: Secondary | ICD-10-CM | POA: Diagnosis not present

## 2014-08-21 DIAGNOSIS — I252 Old myocardial infarction: Secondary | ICD-10-CM | POA: Diagnosis not present

## 2014-08-21 DIAGNOSIS — E785 Hyperlipidemia, unspecified: Secondary | ICD-10-CM | POA: Diagnosis not present

## 2014-08-21 DIAGNOSIS — I471 Supraventricular tachycardia, unspecified: Secondary | ICD-10-CM | POA: Insufficient documentation

## 2014-08-21 DIAGNOSIS — I10 Essential (primary) hypertension: Secondary | ICD-10-CM | POA: Insufficient documentation

## 2014-08-21 DIAGNOSIS — I2510 Atherosclerotic heart disease of native coronary artery without angina pectoris: Secondary | ICD-10-CM | POA: Insufficient documentation

## 2014-08-21 DIAGNOSIS — Z79899 Other long term (current) drug therapy: Secondary | ICD-10-CM | POA: Insufficient documentation

## 2014-08-21 LAB — RPR

## 2014-08-21 LAB — HIV ANTIBODY (ROUTINE TESTING W REFLEX): HIV 1&2 Ab, 4th Generation: NONREACTIVE

## 2014-08-21 MED ORDER — AZITHROMYCIN 1 G PO PACK
1.0000 g | PACK | Freq: Once | ORAL | Status: DC
  Filled 2014-08-21: qty 1

## 2014-08-21 MED ORDER — LIDOCAINE HCL 1 % IJ SOLN
INTRAMUSCULAR | Status: AC
  Filled 2014-08-21: qty 20

## 2014-08-21 MED ORDER — PENICILLIN G BENZATHINE 1200000 UNIT/2ML IM SUSP
2.4000 10*6.[IU] | Freq: Once | INTRAMUSCULAR | Status: DC
  Filled 2014-08-21: qty 4

## 2014-08-21 MED ORDER — CEFTRIAXONE SODIUM 250 MG IJ SOLR
250.0000 mg | Freq: Once | INTRAMUSCULAR | Status: DC
  Filled 2014-08-21: qty 250

## 2014-08-21 NOTE — Discharge Instructions (Signed)
Today you were tested for syphilis, gonorrhea, Chlamydia, HIV in the emergency department. As you are currently asymptomatic he were not given any treatment. He will receive a phone call in the next 2 days if any of these tests come back positive. At that point he will need prescriptions. Please return to the emergency department with new or worsening symptoms. Please abstain from intercourse for the next 2 days. He did receive a call that any of these tests are positive please receive treatment before having sexual intercourse.  Sexually Transmitted Disease A sexually transmitted disease (STD) is a disease or infection often passed to another person during sex. However, STDs can be passed through nonsexual ways. An STD can be passed through:  Spit (saliva).  Semen.  Blood.  Mucus from the vagina.  Pee (urine). HOW CAN I LESSEN MY CHANCES OF GETTING AN STD?  Use:  Latex condoms.  Water-soluble lubricants with condoms. Do not use petroleum jelly or oils.  Dental dams. These are small pieces of latex that are used as a barrier during oral sex.  Avoid having more than one sex partner.  Do not have sex with someone who has other sex partners.  Do not have sex with anyone you do not know or who is at high risk for an STD.  Avoid risky sex that can break your skin.  Do not have sex if you have open sores on your mouth or skin.  Avoid drinking too much alcohol or taking illegal drugs. Alcohol and drugs can affect your good judgment.  Avoid oral and anal sex acts.  Get shots (vaccines) for HPV and hepatitis.  If you are at risk of being infected with HIV, it is advised that you take a certain medicine daily to prevent HIV infection. This is called pre-exposure prophylaxis (PrEP). You may be at risk if:  You are a man who has sex with other men (MSM).  You are attracted to the opposite sex (heterosexual) and are having sex with more than one partner.  You take drugs with a  needle.  You have sex with someone who has HIV.  Talk with your doctor about if you are at high risk of being infected with HIV. If you begin to take PrEP, get tested for HIV first. Get tested every 3 months for as long as you are taking PrEP. WHAT SHOULD I DO IF I THINK I HAVE AN STD?  See your doctor.  Tell your sex partner(s) that you have an STD. They should be tested and treated.  Do not have sex until your doctor says it is okay. WHEN SHOULD I GET HELP? Get help right away if:  You have bad belly (abdominal) pain.  You are a man and have puffiness (swelling) or pain in your testicles.  You are a woman and have puffiness in your vagina. Document Released: 12/17/2004 Document Revised: 11/14/2013 Document Reviewed: 05/05/2013 [facility] Patient Information 2015 [place], [place]. This information is not intended to replace advice given to you by your health care provider. Make sure you discuss any questions you have with your health care provider.

## 2014-08-21 NOTE — ED Provider Notes (Signed)
CSN: [id number]     Arrival date & time 08/21/14  7341 History   First MD Initiated Contact with Patient 08/21/14 0413     Chief Complaint  Patient presents with  . Exposure to STD     (Consider location/radiation/quality/duration/timing/severity/associated sxs/prior Treatment) HPI Comments: Patient is a 48 year old male with history of HTN, HLP, CAD, STEMI, PSVT who presents to ED after STD exposure. He reports that he recently began having intercourse with a new partner. This partner told him that she had just been diagnosed with either chlamydia or syphilis.  He is unsure which disease she has. He denies any symptoms including pain, dysuria, urgency, frequency, rashes, lesions, penile discharge, abdominal pain, nausea, vomiting.   Patient is a 48 y.o. male presenting with STD exposure. The history is provided by the patient. No language interpreter was used.  Exposure to STD Pertinent negatives include no abdominal pain, chest pain, chills, fever, nausea or vomiting.    Past Medical History  Diagnosis Date  . HTN (hypertension)   . HLD (hyperlipidemia)   . Family history of early CAD     Brother had MI at age 78 yo.  . Coronary artery disease     late presentation with ant STEMI 11/12: LHC 10/12/11: oLM 20%, LAD occluded with dissection 2/2 MI.  PCI with BMS to LAD with poor distal runoff; small caliber apical vessel c/w edematous apex from delayed presentation.  . ST elevation myocardial infarction (STEMI) of anterior wall 09/2011    s/p BMS to occluded LAD  . Cardiomyopathy, ischemic 09/2011    EF 40-45%;  echo 10/16/11: Mild LVH, EF 45-50%, very apical akinesis, LV apical thrombus, grade 2 diastolic dysfunction, trivial MR, PASP 29.  . 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 Elevated LFTs     Abdominal ultrasound was unremarkable  . PSVT (paroxysmal supraventricular tachycardia)     s/p adenocard 12 mg IVP 10/2011   Past Surgical History  Procedure  Laterality Date  . Wrist surgery      cyst removal   . Leg surgery      cyst removal   Family History  Problem Relation Age of Onset  . Coronary artery disease Brother     Had MI in his 86 yo  . Hypertension Mother   . Hypertension Brother   . Hypertension Brother    History  Substance Use Topics  . Smoking status: Never Smoker   . Smokeless tobacco: Never Used  . Alcohol Use: 0.6 oz/week    1 Cans of beer per week     Comment: socially    Review of Systems  Constitutional: Negative for fever and chills.  Respiratory: Negative for shortness of breath.   Cardiovascular: Negative for chest pain.  Gastrointestinal: Negative for nausea, vomiting and abdominal pain.  Genitourinary: Negative for dysuria, urgency, frequency, hematuria, flank pain, decreased urine volume, discharge, penile swelling, scrotal swelling, difficulty urinating, genital sores, penile pain and testicular pain.       Exposure to STD  All other systems reviewed and are negative.     Allergies  Lisinopril  Home Medications   Prior to Admission medications   Medication Sig Start Date End Date Taking? Authorizing Provider  aspirin EC 81 MG tablet Take 1 tablet (81 mg total) by mouth daily. 06/20/12  Yes Herby Abraham, MD  losartan (COZAAR) 100 MG tablet Take 100 mg by mouth daily.   Yes Historical Provider, MD  metoprolol (LOPRESSOR) 50  MG tablet Take 1 tablet (50 mg total) by mouth 2 (two) times daily. 08/03/14  Yes Kathleene Hazelhristopher D McAlhany, MD  nitroGLYCERIN (NITROSTAT) 0.4 MG SL tablet Place 0.4 mg under the tongue every 5 (five) minutes as needed for chest pain.    Yes Historical Provider, MD  pravastatin (PRAVACHOL) 80 MG tablet Take 1 tablet (80 mg total) by mouth daily. 07/23/14  Yes Kathleene Hazelhristopher D McAlhany, MD   BP 142/86  Pulse 69  Temp(Src) 98.5 F (36.9 C) (Oral)  Resp 16  Ht 5\' 7"  (1.702 m)  Wt 170 lb (77.111 kg)  BMI 26.62 kg/m2  SpO2 100% Physical Exam  Nursing note and vitals  reviewed. Constitutional: He is oriented to person, place, and time. He appears well-developed and well-nourished. No distress.  HENT:  Head: Normocephalic and atraumatic.  Right Ear: External ear normal.  Left Ear: External ear normal.  Nose: Nose normal.  Eyes: Conjunctivae are normal.  Neck: Normal range of motion. No tracheal deviation present.  Cardiovascular: Normal rate, regular rhythm and normal heart sounds.   Pulmonary/Chest: Effort normal and breath sounds normal. No stridor.  Abdominal: Soft. He exhibits no distension. There is no tenderness.  Genitourinary: Testes normal and penis normal. Right testis shows no mass, no swelling and no tenderness. Left testis shows no mass, no swelling and no tenderness. Circumcised. No phimosis, paraphimosis, hypospadias, penile erythema or penile tenderness. No discharge found.  Musculoskeletal: Normal range of motion.  Neurological: He is alert and oriented to person, place, and time.  Skin: Skin is warm and dry. He is not diaphoretic.  Psychiatric: He has a normal mood and affect. His behavior is normal.    ED Course  Procedures (including critical care time) Labs Review Labs Reviewed  GC/CHLAMYDIA PROBE AMP  RPR  HIV ANTIBODY (ROUTINE TESTING)    Imaging Review No results found.   EKG Interpretation None      MDM   Final diagnoses:  STD exposure    Patient presents to ED after STD exposure. He is asymptomatic. HIV/RPR/GC/Chlamydia was done in ED. Patient was not given treatment as he is currently not experiencing any symptoms. Discussed with patient that he needs to abstain from sexual intercourse for the next few days while he waits for a call. He understands he will receive a call if any of his tests are positive. If he is positive he needs to continue to abstain for sexual intercourse until he has been treated appropriately. Also discussed health department follow up. Discussed reasons to return to ED immediately. Vital  signs stable for discharge. Discussed case with Dr. Patria Maneampos who agrees with plan. Patient / Family / Caregiver informed of clinical course, understand medical decision-making process, and agree with plan.     Mora BellmanHannah S Jerica Creegan, PA-C 08/21/14 [phone]

## 2014-08-21 NOTE — ED Notes (Signed)
Patient reports the girl he was sleeping with informed him she had an STD, he believes chlamydia. Patient is requesting STD check this am. Patient denies any symptoms.

## 2014-08-21 NOTE — ED Provider Notes (Signed)
Medical screening examination/treatment/procedure(s) were performed by non-physician practitioner and as supervising physician I was immediately available for consultation/collaboration.   EKG Interpretation None        Lyanne Co, MD 08/21/14 [phone]

## 2014-08-22 LAB — GC/CHLAMYDIA PROBE AMP
CT Probe RNA: NEGATIVE
GC Probe RNA: NEGATIVE

## 2014-08-26 ENCOUNTER — Other Ambulatory Visit: Payer: Self-pay | Admitting: Cardiovascular Disease

## 2014-08-29 ENCOUNTER — Other Ambulatory Visit: Payer: Self-pay | Admitting: Cardiovascular Disease

## 2014-10-14 ENCOUNTER — Other Ambulatory Visit: Payer: Self-pay | Admitting: Cardiovascular Disease

## 2014-10-31 ENCOUNTER — Encounter (HOSPITAL_COMMUNITY): Payer: Self-pay | Admitting: Cardiology

## 2014-11-05 ENCOUNTER — Other Ambulatory Visit: Payer: Self-pay | Admitting: Cardiology

## 2014-11-06 ENCOUNTER — Other Ambulatory Visit: Payer: Self-pay | Admitting: Cardiology

## 2014-11-20 ENCOUNTER — Ambulatory Visit (INDEPENDENT_AMBULATORY_CARE_PROVIDER_SITE_OTHER): Payer: BC Managed Care – PPO | Admitting: Cardiology

## 2014-11-20 ENCOUNTER — Encounter: Payer: Self-pay | Admitting: Cardiology

## 2014-11-20 VITALS — BP 134/92 | HR 61 | Ht 67.0 in | Wt 181.4 lb

## 2014-11-20 DIAGNOSIS — Z9861 Coronary angioplasty status: Secondary | ICD-10-CM

## 2014-11-20 DIAGNOSIS — E785 Hyperlipidemia, unspecified: Secondary | ICD-10-CM

## 2014-11-20 DIAGNOSIS — I10 Essential (primary) hypertension: Secondary | ICD-10-CM

## 2014-11-20 DIAGNOSIS — I2510 Atherosclerotic heart disease of native coronary artery without angina pectoris: Secondary | ICD-10-CM

## 2014-11-20 NOTE — Assessment & Plan Note (Signed)
Followed by PCP

## 2014-11-20 NOTE — Patient Instructions (Signed)
Your physician recommends that you continue on your current medications as directed. Please refer to the Current Medication list given to you today.   Your physician wants you to follow-up in: 1 year with Dr.Mcalhany You will receive a reminder letter in the mail two months in advance. If you don't receive a letter, please call our office to schedule the follow-up appointment.

## 2014-11-20 NOTE — Progress Notes (Signed)
    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 He was treated with an LAD BMS. He had an LV thrombus post PCI treated with anticoagulation for a time but he has been off that since 2013. His EF initially was 40-45% but f/u echo in 2013 showed his EF to be 60-65%. He is here for an annual check up. He denies any chest pain or unusual dyspnea.    Current Outpatient Prescriptions  Medication Sig Dispense Refill  . aspirin EC 81 MG tablet Take 1 tablet (81 mg total) by mouth daily. 1 tablet 0  . losartan (COZAAR) 100 MG tablet TAKE 1 TABLET BY MOUTH EVERY DAY 30 tablet 11  . metoprolol (LOPRESSOR) 50 MG tablet Take 1 tablet (50 mg total) by mouth 2 (two) times daily. 180 tablet 1  . nitroGLYCERIN (NITROSTAT) 0.4 MG SL tablet Place 0.4 mg under the tongue every 5 (five) minutes as needed for chest pain.     . pravastatin (PRAVACHOL) 80 MG tablet Take 1 tablet (80 mg total) by mouth daily. 30 tablet 0   No current facility-administered medications for this visit.    Allergies  Allergen Reactions  . Lisinopril Cough    History   Social History  . Marital Status: Single    Spouse Name: N/A    Number of Children: N/A  . Years of Education: N/A   Occupational History  . Not on file.   Social History Main Topics  . Smoking status: Never Smoker   . Smokeless tobacco: Never Used  . Alcohol Use: 0.6 oz/week    1 Cans of beer per week     Comment: socially  . Drug Use: No  . Sexual Activity: Yes    Birth Control/ Protection: None   Other Topics Concern  . Not on file   Social History Narrative     Review of Systems: General: negative for chills, fever, night sweats or weight changes.  Cardiovascular: negative for chest pain, dyspnea on exertion, edema, orthopnea, palpitations,  paroxysmal nocturnal dyspnea or shortness of breath Dermatological: negative for rash Respiratory: negative for cough or wheezing Urologic: negative for hematuria Abdominal: negative for nausea, vomiting, diarrhea, bright red blood per rectum, melena, or hematemesis Neurologic: negative for visual changes, syncope, or dizziness All other systems reviewed and are otherwise negative except as noted above.    Blood pressure 134/92, pulse 61, height 5\' 7"  (1.702 m), weight 181 lb 6.4 oz (82.283 kg).  General appearance: alert, cooperative and no distress Neck: no carotid bruit and no JVD Lungs: clear to auscultation bilaterally Heart: regular rate and rhythm  EKG NSR Inferior Qs, TWI V3-V5 - no new changes  ASSESSMENT AND PLAN:   CAD S/P percutaneous coronary angioplasty MI, LAD BMS Nov 2012  Hypertension Repeat B/P 122/82  HLD (hyperlipidemia) Followed by PCP   PLAN  I have requested lipids and LFTs from his PCP. He will f/u with Dr Sanjuana Kava in a year.   [name] KPA-C 11/20/2014 8:17 AM

## 2014-11-20 NOTE — Assessment & Plan Note (Signed)
Repeat B/P 122/82

## 2014-11-20 NOTE — Assessment & Plan Note (Signed)
MI, LAD BMS Nov 2012

## 2015-03-24 DIAGNOSIS — I2699 Other pulmonary embolism without acute cor pulmonale: Secondary | ICD-10-CM

## 2015-03-24 HISTORY — DX: Other pulmonary embolism without acute cor pulmonale: I26.99

## 2015-04-02 ENCOUNTER — Emergency Department (HOSPITAL_COMMUNITY): Payer: BLUE CROSS/BLUE SHIELD

## 2015-04-02 ENCOUNTER — Encounter (HOSPITAL_COMMUNITY): Payer: Self-pay | Admitting: Physical Medicine and Rehabilitation

## 2015-04-02 ENCOUNTER — Emergency Department (HOSPITAL_COMMUNITY)
Admission: EM | Admit: 2015-04-02 | Discharge: 2015-04-02 | Disposition: A | Discharge status: Home / Self Care | Payer: BLUE CROSS/BLUE SHIELD | Attending: Emergency Medicine | Admitting: Emergency Medicine

## 2015-04-02 DIAGNOSIS — I252 Old myocardial infarction: Secondary | ICD-10-CM | POA: Insufficient documentation

## 2015-04-02 DIAGNOSIS — R0602 Shortness of breath: Secondary | ICD-10-CM | POA: Insufficient documentation

## 2015-04-02 DIAGNOSIS — Z79899 Other long term (current) drug therapy: Secondary | ICD-10-CM | POA: Diagnosis not present

## 2015-04-02 DIAGNOSIS — G4700 Insomnia, unspecified: Secondary | ICD-10-CM | POA: Diagnosis not present

## 2015-04-02 DIAGNOSIS — I10 Essential (primary) hypertension: Secondary | ICD-10-CM | POA: Insufficient documentation

## 2015-04-02 DIAGNOSIS — E785 Hyperlipidemia, unspecified: Secondary | ICD-10-CM | POA: Insufficient documentation

## 2015-04-02 DIAGNOSIS — Z7982 Long term (current) use of aspirin: Secondary | ICD-10-CM | POA: Diagnosis not present

## 2015-04-02 DIAGNOSIS — Z9861 Coronary angioplasty status: Secondary | ICD-10-CM | POA: Insufficient documentation

## 2015-04-02 DIAGNOSIS — R5383 Other fatigue: Secondary | ICD-10-CM | POA: Diagnosis present

## 2015-04-02 DIAGNOSIS — I2510 Atherosclerotic heart disease of native coronary artery without angina pectoris: Secondary | ICD-10-CM | POA: Insufficient documentation

## 2015-04-02 LAB — COMPREHENSIVE METABOLIC PANEL
ALK PHOS: 67 U/L (ref 38–126)
ALT: 21 U/L (ref 17–63)
AST: 26 U/L (ref 15–41)
Albumin: 4 g/dL (ref 3.5–5.0)
Anion gap: 9 (ref 5–15)
BUN: 5 mg/dL — AB (ref 6–20)
CHLORIDE: 107 mmol/L (ref 101–111)
CO2: 23 mmol/L (ref 22–32)
Calcium: 9.2 mg/dL (ref 8.9–10.3)
Creatinine, Ser: 1.23 mg/dL (ref 0.61–1.24)
GFR calc non Af Amer: >60 mL/min (ref >60)
Glucose, Bld: 121 mg/dL — ABNORMAL HIGH (ref 70–99)
Potassium: 3.6 mmol/L (ref 3.5–5.1)
SODIUM: 139 mmol/L (ref 135–145)
Total Bilirubin: 1.1 mg/dL (ref 0.3–1.2)
Total Protein: 6.7 g/dL (ref 6.5–8.1)

## 2015-04-02 LAB — CBC WITH DIFFERENTIAL/PLATELET
BASOS ABS: 0 10*3/uL (ref 0.0–0.1)
BASOS PCT: 0 % (ref 0–1)
EOS PCT: 0 % (ref 0–5)
Eosinophils Absolute: 0 10*3/uL (ref 0.0–0.7)
HCT: 43.1 % (ref 39.0–52.0)
Hemoglobin: 15.3 g/dL (ref 13.0–17.0)
Lymphocytes Relative: 34 % (ref 12–46)
Lymphs Abs: 1.8 10*3/uL (ref 0.7–4.0)
MCH: 32.1 pg (ref 26.0–34.0)
MCHC: 35.5 g/dL (ref 30.0–36.0)
MCV: 90.5 fL (ref 78.0–100.0)
Monocytes Absolute: 0.3 10*3/uL (ref 0.1–1.0)
Monocytes Relative: 6 % (ref 3–12)
NEUTROS PCT: 60 % (ref 43–77)
Neutro Abs: 3.3 10*3/uL (ref 1.7–7.7)
Platelets: 257 10*3/uL (ref 150–400)
RBC: 4.76 MIL/uL (ref 4.22–5.81)
RDW: 12.8 % (ref 11.5–15.5)
WBC: 5.5 10*3/uL (ref 4.0–10.5)

## 2015-04-02 LAB — I-STAT TROPONIN, ED
TROPONIN I, POC: 0 ng/mL (ref 0.00–0.08)
Troponin i, poc: 0.01 ng/mL (ref 0.00–0.08)

## 2015-04-02 MED ORDER — SODIUM CHLORIDE 0.9 % IV BOLUS (SEPSIS)
1000.0000 mL | Freq: Once | INTRAVENOUS | Status: AC
  Administered 2015-04-02: 1000 mL via INTRAVENOUS

## 2015-04-02 MED ORDER — GADOBENATE DIMEGLUMINE 529 MG/ML IV SOLN
15.0000 mL | Freq: Once | INTRAVENOUS | Status: AC | PRN
  Administered 2015-04-02: 15 mL via INTRAVENOUS

## 2015-04-02 NOTE — ED Notes (Signed)
Troponin 0.01

## 2015-04-02 NOTE — ED Notes (Signed)
Pt denies needing a wheelchair for discharge upon walking through the hall pt became very weak and reported he feels like he may pass out. This RN brought patient a wheelchair and wheeled him to lobby. Pt requesting a cup of water. Reports he has no ride home and is driving himself. Attempted to walk again and became very dizzy. Encouraged patient not to drive home due to weakness. Pt wheeled back to treatment room. Dr. Criss Alvine aware.

## 2015-04-02 NOTE — ED Notes (Signed)
Pt presents to department for evaluation of generalized fatigue, and SOB. Onset last night. History of MI. Denies chest pain upon arrival. Respirations unlabored. Pt is alert and oriented x4.

## 2015-04-02 NOTE — ED Notes (Signed)
Pt connected to cardiac monitor and continuous BP and pulse ox. Phlebotomy at bedside.

## 2015-04-02 NOTE — ED Notes (Signed)
Attempted to walk patient, still reporting dizziness. Pt reporting he does not feel any better. MD aware.

## 2015-04-02 NOTE — ED Provider Notes (Signed)
CSN: [id number]     Arrival date & time 04/02/15  4098 History   First MD Initiated Contact with Patient 04/02/15 [phone]     Chief Complaint  Patient presents with  . Shortness of Breath  . Fatigue     (Consider location/radiation/quality/duration/timing/severity/associated sxs/prior Treatment) HPI Comments: 49 year old male comes in with several days of worsening fatigue. Patient states he has not been sleeping well. THIRD shift. Has been taking over-the-counter sleeping medications without significant benefit. States he comes today because of this worsening fatigue. He initially stated he had some shortness of breath in triage however on my exam patient states he is not having shortness of breath. He just "feels funny". Denies any chest pain or shortness of breath multiple times to me. Has been taking medications as prescribed. No other change to health.  Patient is a 49 y.o. male presenting with general illness.  Illness Quality:  Fatigue Severity:  Moderate Onset quality:  Gradual Timing:  Constant Progression:  Unchanged Chronicity:  New Context:  Not sleeping Associated symptoms: fatigue   Associated symptoms: no abdominal pain, no chest pain, no congestion, no cough, no fever, no headaches and no rash     Past Medical History  Diagnosis Date  . HTN (hypertension)   . HLD (hyperlipidemia)   . Family history of early CAD     Brother had MI at age 29 yo.  . Coronary artery disease     late presentation with ant STEMI 11/12: LHC 10/12/11: oLM 20%, LAD occluded with dissection 2/2 MI.  PCI with BMS to LAD with poor distal runoff; small caliber apical vessel c/w edematous apex from delayed presentation.  . ST elevation myocardial infarction (STEMI) of anterior wall 09/2011    s/p BMS to occluded LAD  . Cardiomyopathy, ischemic 09/2011    EF 40-45%;  echo 10/16/11: Mild LVH, EF 45-50%, very apical akinesis, LV apical thrombus, grade 2 diastolic dysfunction, trivial MR, PASP 29.  .  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 Elevated LFTs     Abdominal ultrasound was unremarkable  . PSVT (paroxysmal supraventricular tachycardia)     s/p adenocard 12 mg IVP 10/2011   Past Surgical History  Procedure Laterality Date  . Wrist surgery      cyst removal   . Leg surgery      cyst removal  . Coronary angiogram N/A 10/12/2011    Procedure: CORONARY ANGIOGRAM;  Surgeon: Herby Abraham, MD;  Location: [facility] CATH LAB;  Service: Cardiovascular;  Laterality: N/A;  . Percutaneous stent intervention N/A 10/12/2011    Procedure: PERCUTANEOUS STENT INTERVENTION;  Surgeon: Herby Abraham, MD;  Location: [facility] CATH LAB;  Service: Cardiovascular;  Laterality: N/A;   Family History  Problem Relation Age of Onset  . Coronary artery disease Brother     Had MI in his 28 yo  . Hypertension Mother   . Hypertension Brother   . Hypertension Brother    History  Substance Use Topics  . Smoking status: Never Smoker   . Smokeless tobacco: Never Used  . Alcohol Use: 0.6 oz/week    1 Cans of beer per week     Comment: socially    Review of Systems  Constitutional: Positive for fatigue. Negative for fever.  HENT: Negative for congestion.   Respiratory: Negative for cough.   Cardiovascular: Negative for chest pain.  Gastrointestinal: Negative for abdominal pain.  Musculoskeletal: Negative for back pain.  Skin: Negative for rash.  Neurological: Negative for  headaches.  Psychiatric/Behavioral: Negative for confusion.  All other systems reviewed and are negative.     Allergies  Lisinopril  Home Medications   Prior to Admission medications   Medication Sig Start Date End Date Taking? Authorizing Provider  acetaminophen (TYLENOL) 325 MG tablet Take 650 mg by mouth every 6 (six) hours as needed for mild pain.   Yes Historical Provider, MD  aspirin EC 81 MG tablet Take 1 tablet (81 mg total) by mouth daily. 06/20/12  Yes Herby Abraham, MD  ibuprofen  (ADVIL,MOTRIN) 200 MG tablet Take 400 mg by mouth every 6 (six) hours as needed for headache.   Yes Historical Provider, MD  losartan (COZAAR) 100 MG tablet TAKE 1 TABLET BY MOUTH EVERY DAY 08/30/14  Yes Kathleene Hazel, MD  metoprolol (LOPRESSOR) 50 MG tablet Take 1 tablet (50 mg total) by mouth 2 (two) times daily. 10/15/14  Yes Kathleene Hazel, MD  nitroGLYCERIN (NITROSTAT) 0.4 MG SL tablet Place 0.4 mg under the tongue every 5 (five) minutes as needed for chest pain.    Yes Historical Provider, MD  pravastatin (PRAVACHOL) 80 MG tablet Take 1 tablet (80 mg total) by mouth daily. 07/23/14  Yes Kathleene Hazel, MD   BP 148/80 mmHg  Pulse 84  Temp(Src) 98.4 F (36.9 C) (Oral)  Resp 26  SpO2 99% Physical Exam  Constitutional: He appears well-developed.  HENT:  Head: Normocephalic.  Eyes: Pupils are equal, round, and reactive to light.  Neck: Normal range of motion.  Cardiovascular: Normal rate.   No murmur heard. Pulmonary/Chest: Effort normal. No respiratory distress. He has no wheezes.  Abdominal: Soft. He exhibits no distension. There is no tenderness.  Musculoskeletal: Normal range of motion. He exhibits no edema.  Neurological: He is alert. No cranial nerve deficit. He exhibits normal muscle tone.  Skin: Skin is warm.  Psychiatric: He has a normal mood and affect.  Vitals reviewed.   ED Course  Procedures (including critical care time) Labs Review Labs Reviewed  COMPREHENSIVE METABOLIC PANEL - Abnormal; Notable for the following:    Glucose, Bld 121 (*)    BUN 5 (*)    All other components within normal limits  CBC WITH DIFFERENTIAL/PLATELET  Rosezena Sensor, ED    Imaging Review Dg Chest 2 View  04/02/2015   CLINICAL DATA:  49 year old male with shortness of breath and fatigue for 2 days. Initial encounter.  EXAM: CHEST  2 VIEW  COMPARISON:  01/18/2014 and earlier.  FINDINGS: Lower lung volumes. Stable cardiac size at the upper limits of normal.  Other mediastinal contours are within normal limits. Visualized tracheal air column is within normal limits. No pneumothorax, pulmonary edema, pleural effusion or consolidation. Crowding of lung markings. No confluent pulmonary opacity. No acute osseous abnormality identified.  IMPRESSION: Low lung volumes, otherwise no acute cardiopulmonary abnormality.   Electronically Signed   By: Odessa Fleming M.D.   On: 04/02/2015 09:47     EKG Interpretation   Date/Time:  Tuesday Apr 02 2015 08:45:09 EDT Ventricular Rate:  72 PR Interval:  148 QRS Duration: 82 QT Interval:  364 QTC Calculation: 398 R Axis:   -67 Text Interpretation:  Normal sinus rhythm Left axis deviation Inferior  infarct , age undetermined Anterolateral infarct , age undetermined  Abnormal ECG no significant change since Feb 2015 Confirmed by Criss Alvine   MD, SCOTT ([id number]) on 04/02/2015 9:09:56 AM      MDM  49 year old male comes in with complaint of fatigue. Patient endorses  difficulty sleeping for several days. This is likely positive fatigue at this time. His physical exam is unremarkable. He is denying any shortness of breath or chest pain on my examination. This does not appear to be cardiac related. His EKG is checked unchanged from previous. Initial troponin obtained in triage 0.00. CBC CMP unremarkable. Chest x-ray showed no cardio or abnormalities. Patient's vital signs were normal. Given his overall well appearance, history and clinical exam do not suspect serious at this time. Follow PCP. Discussed return precautions with patient who voiced understanding.   12:02 PM Patient in processes being discharged. Upon leaving the room he felt very weak and lightheaded. Aren't reported he was moving fairly slowly however had no syncopal episode. When discussed with patient upon return to room he now endorses taking multiple doses of melatonin multiple doses of NyQuil and Benadryl this morning in attempt to sleep. His exam is otherwise  similar previous unremarkable with no focal deficits. Will allow to metabolize. Reassess  3:16 PM Reevaluation of patient revealed patient states he is still feeling lightheaded and off balance. He continues to have a normal neurological exam without focal deficits. He is tolerating by mouth. We have obtained additional troponin which was negative. He is on multiple negative troponins as well as a reassuring EKG. Tends to have no concerning signs or history for ACS or serious intrathoracic pathology. Patient's finger-nose-finger coordination is normal. However when he is standing and ambulating he still appears to have moderate gait instability. Because of this we have ordered an MR brain. Patient does endorse taking melatonin and NyQuil and it is possible he has yet to metabolize. We'll reassess after MR. At 3:30 PM patient care to Dr. Rush Landmark.    Final diagnoses:  Other fatigue  Insomnia          Bridgett Larsson, MD 04/02/15 1518  Pricilla Loveless, MD 04/04/15 [phone]

## 2015-04-02 NOTE — ED Provider Notes (Signed)
Physical Exam  BP 126/57 mmHg  Pulse 80  Temp(Src) 98.4 F (36.9 C) (Oral)  Resp 21  SpO2 98%  Physical Exam  Constitutional: He is oriented to person, place, and time. He appears well-developed and well-nourished. No distress.  HENT:  Head: Normocephalic.  Mouth/Throat: Oropharynx is clear and moist. No oropharyngeal exudate.  Eyes: Conjunctivae are normal. Pupils are equal, round, and reactive to light.  Neck: Normal range of motion.  Cardiovascular: Normal rate, regular rhythm, normal heart sounds and intact distal pulses.   No murmur heard. Pulmonary/Chest: Breath sounds normal. No respiratory distress. He has no wheezes. He has no rales. He exhibits no tenderness.  Abdominal: Soft. He exhibits no distension. There is no tenderness. There is no rebound and no guarding.  Musculoskeletal: He exhibits no tenderness.  Neurological: He is alert and oriented to person, place, and time. He has normal reflexes. He displays normal reflexes. No cranial nerve deficit. He exhibits normal muscle tone. Coordination normal.  Skin: Skin is warm. No rash noted. He is not diaphoretic. No erythema. No pallor.  Psychiatric: He has a normal mood and affect.  Nursing note and vitals reviewed.  MR Brain W Wo Contrast  Final Result    DG Chest 2 View  Final Result     I-Stat Troponin, ED (not at [facility]) (Final result) Component (Lab Inquiry)    Collection Time Result Time Troponin i, poc Comment 3   04/02/15 13:17:00 04/02/15 14:02:45 0.01        (Comment)    Due to the release kinetics of cTnI,  a negative result within the first hours  of the onset of symptoms does not rule out  myocardial infarction with certainty.  If myocardial infarction is still suspected,  repeat the test at appropriate intervals.                   I-Stat Troponin, ED (not at [facility]) (Final result) Component (Lab Inquiry)    Collection Time Result Time Troponin i, poc Comment 3   04/02/15  09:03:00 04/02/15 09:14:11 0.00        (Comment)    Due to the release kinetics of cTnI,  a negative result within the first hours  of the onset of symptoms does not rule out  myocardial infarction with certainty.  If myocardial infarction is still suspected,  repeat the test at appropriate intervals.                   CBC with Differential (Final result) Component (Lab Inquiry)    Collection Time Result Time WBC RBC HGB HCT MCV   04/02/15 09:00:00 04/02/15 09:16:56 5.5 4.76 15.3 43.1 90.5      Collection Time Result Time MCH MCHC RDW PLT NEUTRO PCT   04/02/15 09:00:00 04/02/15 09:16:56 32.1 35.5 12.8 257 60      Collection Time Result Time AB NEUTRO LYMPHO PCT AB LYM MONO PCT MONO ABS   04/02/15 09:00:00 04/02/15 09:16:56 3.3 34 1.8 6 0.3      Collection Time Result Time EOS PCT EOSINO ABS BASOS PCT BASOS ABS   04/02/15 09:00:00 04/02/15 09:16:56 0 0.0 0 0.0             Comprehensive metabolic panel (Final result)Abnormal Component (Lab Inquiry)    Collection Time Result Time NA K CL CO2 GLUCOSE   04/02/15 09:00:00 04/02/15 10:08:20 139 3.6 107 23 121 (H)      Collection Time Result Time BUN Creatinine, Ser  CALCIUM PROTEIN Albumin   04/02/15 09:00:00 04/02/15 10:08:20 5 (L) 1.23 9.2 6.7 4.0      Collection Time Result Time AST ALT ALK PHOS BILI TOTL GFR calc non Af Amer   04/02/15 09:00:00 04/02/15 10:08:20 26 21  67 1.1 >60      Collection Time Result Time GFR calc Af Amer Anion gap   04/02/15 09:00:00 04/02/15 10:08:20  >60    (Comment)    (NOTE)  The eGFR has been calculated using the CKD EPI equation.  This calculation has not been validated in all clinical situations.  eGFR's persistently <60 mL/min signify possible Chronic Kidney  Disease.       9           Imaging Results       MR Brain W Wo Contrast (Final result) Result time: 04/02/15 17:30:36   Final result by Rad Results In Interface  (04/02/15 17:30:36)   Narrative:   CLINICAL DATA: Gait instability. Fatigue. Short of breath.  EXAM: MRI HEAD WITHOUT AND WITH CONTRAST  TECHNIQUE: Multiplanar, multiecho pulse sequences of the brain and surrounding structures were obtained without and with intravenous contrast.  CONTRAST: 29m MULTIHANCE GADOBENATE DIMEGLUMINE 529 MG/ML IV SOLN  COMPARISON: None.  FINDINGS: Ventricle size is normal. Cerebral volume is normal. Pituitary normal in size. Craniocervical junction normal. Negative for Chiari malformation.  Mild mucosal edema in the paranasal sinuses without air-fluid level. Calvarium intact. Negative orbit.  Negative for acute or chronic infarction  Negative for demyelinating disease. Cerebral white matter normal. Brainstem and cerebellum normal.  Negative for intracranial hemorrhage or fluid collection  Negative for mass or edema  Normal enhancement following contrast infusion. No enhancing mass lesion. Leptomeningeal enhancement normal.  IMPRESSION: Normal MRI of the brain with contrast  Mild mucosal edema in the paranasal sinuses.   Electronically Signed By: CFranchot GalloM.D. On: 04/02/2015 17:30          DG Chest 2 View (Final result) Result time: 04/02/15 09:47:38   Final result by Rad Results In Interface (04/02/15 09:47:38)   Narrative:   CLINICAL DATA: 49year old male with shortness of breath and fatigue for 2 days. Initial encounter.  EXAM: CHEST 2 VIEW  COMPARISON: 01/18/2014 and earlier.  FINDINGS: Lower lung volumes. Stable cardiac size at the upper limits of normal. Other mediastinal contours are within normal limits. Visualized tracheal air column is within normal limits. No pneumothorax, pulmonary edema, pleural effusion or consolidation. Crowding of lung markings. No confluent pulmonary opacity. No acute osseous abnormality identified.  IMPRESSION: Low lung volumes, otherwise no acute cardiopulmonary  abnormality.   Electronically Signed By: HGenevie AnnM.D. On: 04/02/2015 09:47         ED Course  Procedures  MDM 4:18 PM Pt's care assumed from Dr. POlene Floss  At time of sign out, pt is awaiting results of brain MRI. Plan to reassess and DC if normal. If abnormal, plan to consult neuro.   The patient's MRI returned showing no acute abnormalities. The patient was then reassessed and reassured about the diagnostic imaging and laboratory workup to that point. The patient was walked in the exam room and he was able to ambulate without gait abnormality. The patient continued to report very mild lightheadedness however, he reported that his shortness of breath and improved. The patient was again reassured about his workup and was instructed to stop taking the sleep aids which she took multiple of prior to arrival. The patient was instructed to follow up with a primary physician  in the next several days and was given a note to miss work.  The patient forced understanding of the plan of care with follow-up and return precautions for new or worsening symptoms. The patient had no other problems, questions, or complaints prior to discharge and was discharged in good condition with improvement in his presenting symptoms.  The patient was seen with Dr. Vanita Panda, emergency medicine attending.     Antony Blackbird, MD 04/03/15 [id number]  Carmin Muskrat, MD 04/03/15 1725

## 2015-04-02 NOTE — Discharge Instructions (Signed)

## 2015-04-03 ENCOUNTER — Other Ambulatory Visit: Payer: Self-pay | Admitting: Internal Medicine

## 2015-04-03 DIAGNOSIS — R0602 Shortness of breath: Secondary | ICD-10-CM

## 2015-04-04 ENCOUNTER — Ambulatory Visit
Admission: RE | Admit: 2015-04-04 | Discharge: 2015-04-04 | Disposition: A | Discharge status: Home / Self Care | Payer: BLUE CROSS/BLUE SHIELD | Source: Ambulatory Visit | Attending: Internal Medicine | Admitting: Internal Medicine

## 2015-04-04 DIAGNOSIS — R0602 Shortness of breath: Secondary | ICD-10-CM

## 2015-04-04 MED ORDER — IOPAMIDOL (ISOVUE-370) INJECTION 76%
120.0000 mL | Freq: Once | INTRAVENOUS | Status: AC | PRN
  Administered 2015-04-04: 120 mL via INTRAVENOUS

## 2015-04-05 ENCOUNTER — Ambulatory Visit (HOSPITAL_COMMUNITY): Payer: BLUE CROSS/BLUE SHIELD | Attending: Internal Medicine

## 2015-04-05 ENCOUNTER — Other Ambulatory Visit: Payer: Self-pay | Admitting: Internal Medicine

## 2015-04-05 DIAGNOSIS — I10 Essential (primary) hypertension: Secondary | ICD-10-CM | POA: Diagnosis not present

## 2015-04-05 DIAGNOSIS — R0602 Shortness of breath: Secondary | ICD-10-CM

## 2015-04-05 DIAGNOSIS — E785 Hyperlipidemia, unspecified: Secondary | ICD-10-CM | POA: Insufficient documentation

## 2015-04-05 NOTE — Progress Notes (Unsigned)
Apical thrombus noted in LV, reviewed images with DOD (Dr Patty Sermons), released patient per Dr Patty Sermons.

## 2015-04-08 ENCOUNTER — Telehealth: Payer: Self-pay | Admitting: Cardiovascular Disease

## 2015-04-08 NOTE — Telephone Encounter (Signed)
Called patient back to notify him that the test was actually ordered by Dr. [name], therefore he will need to obtain the results from the ordering physician. Patient verbalized understanding.

## 2015-04-08 NOTE — Telephone Encounter (Signed)
New message ° ° ° ° ° °Want echo results °

## 2015-04-08 NOTE — Telephone Encounter (Signed)
Notified patient that results from his ECHO are not available as of yet from Dr. [name] since he has been in the Cath Lab all day today. Patient verbalized understanding and he was informed that office would call him with results upon receiving them from Dr. [name].

## 2015-04-10 ENCOUNTER — Telehealth: Payer: Self-pay | Admitting: Cardiovascular Disease

## 2015-04-10 NOTE — Telephone Encounter (Signed)
I can see him Friday. cdm

## 2015-04-10 NOTE — Telephone Encounter (Signed)
New Message   Dr. Eula Listen office calling to make OV w/in 1 week w/ Mclahnay or PA- per [facility] @ Dr. Paulita Fujita office- Possible Vent thrombis. No avail appt w/ APP, does need flex? Please call back and discuss.

## 2015-04-10 NOTE — Telephone Encounter (Signed)
I spoke with [name] at Dr. Venita Sheffield office and told her Dr. [name] could see pt on Apr 12, 2015 at 2:45.  [name] will contact him with appt time.  Will fax echo results to Dr. [name] per [name]'s request--Fax-[phone]

## 2015-04-12 ENCOUNTER — Encounter: Payer: Self-pay | Admitting: Cardiovascular Disease

## 2015-04-12 ENCOUNTER — Other Ambulatory Visit: Payer: Self-pay

## 2015-04-12 ENCOUNTER — Ambulatory Visit (INDEPENDENT_AMBULATORY_CARE_PROVIDER_SITE_OTHER): Payer: BLUE CROSS/BLUE SHIELD | Admitting: Cardiovascular Disease

## 2015-04-12 VITALS — BP 148/92 | HR 62 | Ht 67.0 in | Wt 174.2 lb

## 2015-04-12 DIAGNOSIS — R002 Palpitations: Secondary | ICD-10-CM

## 2015-04-12 DIAGNOSIS — E785 Hyperlipidemia, unspecified: Secondary | ICD-10-CM | POA: Diagnosis not present

## 2015-04-12 DIAGNOSIS — I25110 Atherosclerotic heart disease of native coronary artery with unstable angina pectoris: Secondary | ICD-10-CM | POA: Diagnosis not present

## 2015-04-12 DIAGNOSIS — I10 Essential (primary) hypertension: Secondary | ICD-10-CM

## 2015-04-12 NOTE — Patient Instructions (Signed)
Medication Instructions:  Your physician recommends that you continue on your current medications as directed. Please refer to the Current Medication list given to you today.   Labwork: none  Testing/Procedures: Your physician has requested that you have a lexiscan myoview. For further information please visit [email or web address] Please follow instruction sheet, as given.  Your physician has recommended that you wear a holter monitor. Holter monitors are medical devices that record the heart's electrical activity. Doctors most often use these monitors to diagnose arrhythmias. Arrhythmias are problems with the speed or rhythm of the heartbeat. The monitor is a small, portable device. You can wear one while you do your normal daily activities. This is usually used to diagnose what is causing palpitations/syncope (passing out).    Follow-Up: Your physician recommends that you schedule a follow-up appointment in: about 3 weeks with NP or PA.

## 2015-04-12 NOTE — Progress Notes (Signed)
Chief Complaint  Patient presents with  . Fatigue     History of Present Illness: 49 yo with history of CAD, HTN, HLD, SVT, ischemic cardiomyopathy who is here today for cardiac follow up. He has been followed in the past by Dr. Riley Kill. He had an anterior STEMI 10/12/11 and found to have occluded LAD. He had a bare metal stent placed in the LAD. He was treated with anti-coagulation following d/c for LV apical thrombus but has been off since 2013. Last echo in 2013 with LVEF over 60%.   He is here today for follow up. He has been feeling poorly for the last 10 days with weakness and fatigue. Describes feeling too tired to walk. Seen in [facility] ED 04/02/15 and had negative head MRI. Labs ok. Per pt primary care found a PE on CTA and started Eliquis. I do not have these records. Also echo ordered by primary care in our office with apical akinesis and possible LV apical thrombus noted, as seen in the past. He has been started on Eliquis. He is still feeling poorly. No chest pain but some SOB. Having episodes of his heart "pounding". No skipping of beats. No near syncope.   Primary Care Physician: [name]  Last Lipid Profile:Lipid Panel     Component Value Date/Time   CHOL 110 05/03/2013 0806   TRIG 51.0 05/03/2013 0806   HDL 43.10 05/03/2013 0806   CHOLHDL 3 05/03/2013 0806   VLDL 10.2 05/03/2013 0806   LDLCALC 57 05/03/2013 0806     Past Medical History  Diagnosis Date  . HTN (hypertension)   . HLD (hyperlipidemia)   . Family history of early CAD     Brother had MI at age 28 yo.  . Coronary artery disease     late presentation with ant STEMI 11/12: LHC 10/12/11: oLM 20%, LAD occluded with dissection 2/2 MI.  PCI with BMS to LAD with poor distal runoff; small caliber apical vessel c/w edematous apex from delayed presentation.  . ST elevation myocardial infarction (STEMI) of anterior wall 09/2011    s/p BMS to occluded LAD  . Cardiomyopathy, ischemic 09/2011    EF 40-45%;  echo  10/16/11: Mild LVH, EF 45-50%, very apical akinesis, LV apical thrombus, grade 2 diastolic dysfunction, trivial MR, PASP 29.  . 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 Elevated LFTs     Abdominal ultrasound was unremarkable  . PSVT (paroxysmal supraventricular tachycardia)     s/p adenocard 12 mg IVP 10/2011    Past Surgical History  Procedure Laterality Date  . Wrist surgery      cyst removal   . Leg surgery      cyst removal  . Coronary angiogram N/A 10/12/2011    Procedure: CORONARY ANGIOGRAM;  Surgeon: Herby Abraham, MD;  Location: [facility] CATH LAB;  Service: Cardiovascular;  Laterality: N/A;  . Percutaneous stent intervention N/A 10/12/2011    Procedure: PERCUTANEOUS STENT INTERVENTION;  Surgeon: Herby Abraham, MD;  Location: [facility] CATH LAB;  Service: Cardiovascular;  Laterality: N/A;    Current Outpatient Prescriptions  Medication Sig Dispense Refill  . acetaminophen (TYLENOL) 325 MG tablet Take 650 mg by mouth every 6 (six) hours as needed for mild pain.    [name] ELIQUIS 5 MG TABS tablet Take 1 tablet by mouth 2 (two) times daily.    [name] ibuprofen (ADVIL,MOTRIN) 200 MG tablet Take 400 mg by mouth every 6 (six) hours as needed for headache.    [name]  losartan (COZAAR) 100 MG tablet TAKE 1 TABLET BY MOUTH EVERY DAY 30 tablet 11  . metoprolol (LOPRESSOR) 50 MG tablet Take 1 tablet (50 mg total) by mouth 2 (two) times daily. 180 tablet 1  . nitroGLYCERIN (NITROSTAT) 0.4 MG SL tablet Place 0.4 mg under the tongue every 5 (five) minutes as needed for chest pain.     . pravastatin (PRAVACHOL) 80 MG tablet Take 1 tablet (80 mg total) by mouth daily. 30 tablet 0   No current facility-administered medications for this visit.    Allergies  Allergen Reactions  . Lisinopril Cough    History   Social History  . Marital Status: Single    Spouse Name: N/A  . Number of Children: N/A  . Years of Education: N/A   Occupational History  . Not on file.   Social History Main  Topics  . Smoking status: Never Smoker   . Smokeless tobacco: Never Used  . Alcohol Use: 0.6 oz/week    1 Cans of beer per week     Comment: socially  . Drug Use: No  . Sexual Activity: Yes    Birth Control/ Protection: None   Other Topics Concern  . Not on file   Social History Narrative    Family History  Problem Relation Age of Onset  . Coronary artery disease Brother     Had MI in his 44 yo  . Hypertension Mother   . Hypertension Brother   . Hypertension Brother     Review of Systems:  As stated in the HPI and otherwise negative.   BP 148/92 mmHg  Pulse 62  Ht  (1.702 m)  Wt 174 lb 3.2 oz (79.017 kg)  BMI 27.28 kg/m2  Physical Examination: General: Well developed, well nourished, NAD HEENT: OP clear, mucus membranes moist SKIN: warm, dry. No rashes. Neuro: No focal deficits Musculoskeletal: Muscle strength 5/5 all ext Psychiatric: Mood and affect normal Neck: No JVD, no carotid bruits, no thyromegaly, no lymphadenopathy. Lungs:Clear bilaterally, no wheezes, rhonci, crackles Cardiovascular: Regular rate and rhythm. No murmurs, gallops or rubs. Abdomen:Soft. Bowel sounds present. Non-tender.  Extremities: No lower extremity edema. Pulses are 2 + in the bilateral DP/PT.  Echo 04/05/15: Left ventricle: LVEF is approximately 40% with akinesis of the distal 1/4 of LV Large mural thrombus at apex (15 x 17 mm) The cavity size was normal. Wall thickness was normal. Doppler parameters are consistent with abnormal left ventricular relaxation (grade 1 diastolic dysfunction). - Mitral valve: There was mild regurgitation.  Cardiac cath 10/12/11: Left mainstem: The left main coronary artery was without critical narrowing. There was mild ostial narrowing of 20%.  Left anterior descending (LAD): The left anterior descending artery was totally occluded just beyond the origin of the second diagonal branch. The vessel proximal to the site, including the first  diagonal and septal, were free of significant disease. At the site of total occlusion, the vessel was dissected with TIMI 0 flow. Following wiring, and balloon dilatation, the vessel was opened demonstrating an infarction related dissection. Despite balloon dilatation, there was very poor distal runoff. A 2 mm x 28 mm non-drug-eluting mini vision stent was placed and post dilated. Following passage of the balloon distally, there was TIMI 2 flow into a small caliber apical vessel. This was most consistent with an edematous apex due to delayed presentation.  Left circumflex (LCx): The circumflex artery consisted of 2 major marginal branches, both of which were free of significant disease.  Right coronary  artery (RCA): The right coronary artery was nonselectively injected. It has an anterior takeoff from the left coronary cusp. It will require an AMPLATZ in the future to engage. It was well visualized and provided a posterior descending and posterolateral branch, both of which were free of disease.   EKG:  EKG is not ordered today.   Recent Labs: 04/02/2015: ALT 21; BUN 5*; Creatinine 1.23; Hemoglobin 15.3; Platelets 257; Potassium 3.6; Sodium 139   Lipid Panel    Component Value Date/Time   CHOL 110 05/03/2013 0806   TRIG 51.0 05/03/2013 0806   HDL 43.10 05/03/2013 0806   CHOLHDL 3 05/03/2013 0806   VLDL 10.2 05/03/2013 0806   LDLCALC 57 05/03/2013 0806     Wt Readings from Last 3 Encounters:  04/12/15 174 lb 3.2 oz (79.017 kg)  11/20/14 181 lb 6.4 oz (82.283 kg)  08/21/14 170 lb (77.111 kg)     Other studies Reviewed: Additional studies/ records that were reviewed today include: . Review of the above records demonstrates:    Assessment and Plan:   1. CAD: Stable. Recent fatigue and weakness. This does not sound like angina but need to exclude ischemia with known CAD. Will arrange exercise stress myoview. No change in medications.  2. HLD: On a statin. Lipids followed in primary  care  3. HTN: BP is elevated today. Continue current meds. Recheck at follow up and adjust meds if needed.   4. Palpitations: Will arrange 48 hour monitor to exclude arrhythmias.    Current medicines are reviewed at length with the patient today.  The patient does not have concerns regarding medicines.  The following changes have been made:  no change  Labs/ tests ordered today include:   Orders Placed This Encounter  Procedures  . Myocardial Perfusion Imaging  . Holter monitor - 48 hour    Disposition:   FU with me or office APP in 3 weeks  Signed, Verne Carrow, MD 04/12/2015 5:03 PM    [facility] [address] [place], [place], [place]  [address] Phone: [phone]; Fax: [phone]

## 2015-04-25 ENCOUNTER — Telehealth (HOSPITAL_COMMUNITY): Payer: Self-pay | Admitting: *Deleted

## 2015-04-25 NOTE — Telephone Encounter (Signed)
Patient given detailed instructions per Myocardial Perfusion Study Information Sheet for test on 04/29/15 at  12 noon. Patient verbalized understanding. Antionette Char, RN

## 2015-04-27 ENCOUNTER — Encounter (HOSPITAL_COMMUNITY): Payer: Self-pay | Admitting: *Deleted

## 2015-04-27 ENCOUNTER — Emergency Department (HOSPITAL_COMMUNITY): Payer: BLUE CROSS/BLUE SHIELD

## 2015-04-27 ENCOUNTER — Observation Stay (HOSPITAL_COMMUNITY)
Admission: EM | Admit: 2015-04-27 | Discharge: 2015-04-28 | Disposition: A | Discharge status: Home / Self Care | Payer: BLUE CROSS/BLUE SHIELD | Attending: Cardiology | Admitting: Cardiology

## 2015-04-27 DIAGNOSIS — I2510 Atherosclerotic heart disease of native coronary artery without angina pectoris: Secondary | ICD-10-CM | POA: Insufficient documentation

## 2015-04-27 DIAGNOSIS — I10 Essential (primary) hypertension: Secondary | ICD-10-CM | POA: Insufficient documentation

## 2015-04-27 DIAGNOSIS — Z86711 Personal history of pulmonary embolism: Secondary | ICD-10-CM | POA: Diagnosis not present

## 2015-04-27 DIAGNOSIS — I255 Ischemic cardiomyopathy: Secondary | ICD-10-CM | POA: Insufficient documentation

## 2015-04-27 DIAGNOSIS — R0789 Other chest pain: Secondary | ICD-10-CM

## 2015-04-27 DIAGNOSIS — I200 Unstable angina: Secondary | ICD-10-CM

## 2015-04-27 DIAGNOSIS — R079 Chest pain, unspecified: Secondary | ICD-10-CM | POA: Diagnosis present

## 2015-04-27 DIAGNOSIS — I252 Old myocardial infarction: Secondary | ICD-10-CM | POA: Insufficient documentation

## 2015-04-27 DIAGNOSIS — Z955 Presence of coronary angioplasty implant and graft: Secondary | ICD-10-CM | POA: Insufficient documentation

## 2015-04-27 DIAGNOSIS — E785 Hyperlipidemia, unspecified: Secondary | ICD-10-CM | POA: Insufficient documentation

## 2015-04-27 HISTORY — DX: Other pulmonary embolism without acute cor pulmonale: I26.99

## 2015-04-27 LAB — CREATININE, SERUM: Creatinine, Ser: 1.17 mg/dL (ref 0.61–1.24)

## 2015-04-27 LAB — CBC
HCT: 43.1 % (ref 39.0–52.0)
HEMATOCRIT: 44.9 % (ref 39.0–52.0)
HEMOGLOBIN: 15.2 g/dL (ref 13.0–17.0)
Hemoglobin: 15.8 g/dL (ref 13.0–17.0)
MCH: 32.1 pg (ref 26.0–34.0)
MCH: 32.2 pg (ref 26.0–34.0)
MCHC: 35.2 g/dL (ref 30.0–36.0)
MCHC: 35.3 g/dL (ref 30.0–36.0)
MCV: 91.3 fL (ref 78.0–100.0)
MCV: 91.3 fL (ref 78.0–100.0)
PLATELETS: 234 10*3/uL (ref 150–400)
Platelets: 236 10*3/uL (ref 150–400)
RBC: 4.92 MIL/uL (ref 4.22–5.81)
RBC: 4.72 MIL/uL (ref 4.22–5.81)
RDW: 12.7 % (ref 11.5–15.5)
RDW: 12.7 % (ref 11.5–15.5)
WBC: 6.7 10*3/uL (ref 4.0–10.5)
WBC: 6.9 10*3/uL (ref 4.0–10.5)

## 2015-04-27 LAB — BASIC METABOLIC PANEL
ANION GAP: 10 (ref 5–15)
BUN: 9 mg/dL (ref 6–20)
CO2: 25 mmol/L (ref 22–32)
CREATININE: 1.31 mg/dL — AB (ref 0.61–1.24)
Calcium: 9.6 mg/dL (ref 8.9–10.3)
Chloride: 102 mmol/L (ref 101–111)
GLUCOSE: 114 mg/dL — AB (ref 65–99)
Potassium: 3.9 mmol/L (ref 3.5–5.1)
Sodium: 137 mmol/L (ref 135–145)

## 2015-04-27 LAB — I-STAT TROPONIN, ED: Troponin i, poc: 0 ng/mL (ref 0.00–0.08)

## 2015-04-27 LAB — TROPONIN I

## 2015-04-27 LAB — TSH: TSH: 0.615 u[IU]/mL (ref 0.350–4.500)

## 2015-04-27 LAB — BRAIN NATRIURETIC PEPTIDE: B Natriuretic Peptide: 14.4 pg/mL (ref 0.0–100.0)

## 2015-04-27 MED ORDER — LOSARTAN POTASSIUM 50 MG PO TABS
100.0000 mg | ORAL_TABLET | Freq: Every day | ORAL | Status: DC
  Administered 2015-04-28: 100 mg via ORAL
  Filled 2015-04-27 (×2): qty 2

## 2015-04-27 MED ORDER — PRAVASTATIN SODIUM 80 MG PO TABS
80.0000 mg | ORAL_TABLET | Freq: Every day | ORAL | Status: DC
  Administered 2015-04-28: 80 mg via ORAL
  Filled 2015-04-27 (×2): qty 1

## 2015-04-27 MED ORDER — NITROGLYCERIN 0.4 MG SL SUBL: 0.4000 mg | SUBLINGUAL_TABLET | SUBLINGUAL | Status: DC | PRN

## 2015-04-27 MED ORDER — SERTRALINE HCL 25 MG PO TABS
25.0000 mg | ORAL_TABLET | Freq: Every day | ORAL | Status: DC
  Administered 2015-04-28: 25 mg via ORAL
  Filled 2015-04-27 (×2): qty 1

## 2015-04-27 MED ORDER — ACETAMINOPHEN 325 MG PO TABS: 650.0000 mg | ORAL_TABLET | ORAL | Status: DC | PRN

## 2015-04-27 MED ORDER — ASPIRIN EC 81 MG PO TBEC
81.0000 mg | DELAYED_RELEASE_TABLET | Freq: Every day | ORAL | Status: DC
  Administered 2015-04-28: 81 mg via ORAL
  Filled 2015-04-27: qty 1

## 2015-04-27 MED ORDER — APIXABAN 5 MG PO TABS
5.0000 mg | ORAL_TABLET | Freq: Two times a day (BID) | ORAL | Status: DC
  Administered 2015-04-27 – 2015-04-28 (×2): 5 mg via ORAL
  Filled 2015-04-27 (×3): qty 1

## 2015-04-27 MED ORDER — ONDANSETRON HCL 4 MG/2ML IJ SOLN: 4.0000 mg | Freq: Four times a day (QID) | INTRAMUSCULAR | Status: DC | PRN

## 2015-04-27 MED ORDER — HEPARIN SODIUM (PORCINE) 5000 UNIT/ML IJ SOLN
5000.0000 [IU] | Freq: Three times a day (TID) | INTRAMUSCULAR | Status: DC
Start: 2015-04-27 — End: 2015-04-27

## 2015-04-27 MED ORDER — METOPROLOL TARTRATE 50 MG PO TABS
50.0000 mg | ORAL_TABLET | Freq: Two times a day (BID) | ORAL | Status: DC
  Administered 2015-04-27 – 2015-04-28 (×2): 50 mg via ORAL
  Filled 2015-04-27 (×3): qty 1

## 2015-04-27 MED ORDER — ACETAMINOPHEN 325 MG PO TABS: 650.0000 mg | ORAL_TABLET | Freq: Four times a day (QID) | ORAL | Status: DC | PRN

## 2015-04-27 NOTE — ED Notes (Signed)
Pt made aware of bed assignment 

## 2015-04-27 NOTE — ED Notes (Signed)
Attempted report 

## 2015-04-27 NOTE — ED Notes (Signed)
Attempted report. State room is not assigned as of yet.  Pt resting quietly.  Family at bedside.  No pain

## 2015-04-27 NOTE — ED Notes (Signed)
Pt reports recently had PE and started on blood thinners. Now has mid chest discomfort x 2 days and fatigue.  Denies SOB.

## 2015-04-27 NOTE — H&P (Signed)
Primary cardiologist: Dr [name] Consulting cardiologist: Dr Dina Rich MD  Clinical Summary Rodney Marquez is a 49 y.o.male hx of HTN, HLD, PSVT, CAD with prior anterior STEMI 09/2011 w/ BMS to LAD, hx of apical thrombus and completed course of anticoag presents with chest pain. There is a mention of a prior history of PE for which he has been on eliquis. He was recently seen by Dr [name] in clinic with plans for outpatient stress testing and an outpatient monitor for palpitations, however his symptoms progressed and he presented to the ER.   Reports not feeling well since May. Episodes of severe fatigue, SOB/DOE that makes it difficult walking short distances. Notes occasional palpitations, lasting just a few seconds. Intermittent mild left sided chest pain can occur with episodes, lasts a few seconds to minutes.  BNP 14, K 3.9, Cr 1.31, Hgb 15.8, Plt 236 CXR no acute process EKG RAD, SR, anterior and anterolateral Qwaves and TWI old.    Allergies  Allergen Reactions  . Lisinopril Cough    Medications Scheduled Medications:    Infusions:    PRN Medications:     Past Medical History  Diagnosis Date  . HTN (hypertension)   . HLD (hyperlipidemia)   . Family history of early CAD     Brother had MI at age 40 yo.  . Coronary artery disease     late presentation with ant STEMI 11/12: LHC 10/12/11: oLM 20%, LAD occluded with dissection 2/2 MI.  PCI with BMS to LAD with poor distal runoff; small caliber apical vessel c/w edematous apex from delayed presentation.  . ST elevation myocardial infarction (STEMI) of anterior wall 09/2011    s/p BMS to occluded LAD  . Cardiomyopathy, ischemic 09/2011    EF 40-45%;  echo 10/16/11: Mild LVH, EF 45-50%, very apical akinesis, LV apical thrombus, grade 2 diastolic dysfunction, trivial MR, PASP 29.  . 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 Elevated LFTs     Abdominal ultrasound was unremarkable  . PSVT  (paroxysmal supraventricular tachycardia)     s/p adenocard 12 mg IVP 10/2011  . PE (pulmonary embolism)     Past Surgical History  Procedure Laterality Date  . Wrist surgery      cyst removal   . Leg surgery      cyst removal  . Coronary angiogram N/A 10/12/2011    Procedure: CORONARY ANGIOGRAM;  Surgeon: Herby Abraham, MD;  Location: [facility] CATH LAB;  Service: Cardiovascular;  Laterality: N/A;  . Percutaneous stent intervention N/A 10/12/2011    Procedure: PERCUTANEOUS STENT INTERVENTION;  Surgeon: Herby Abraham, MD;  Location: [facility] CATH LAB;  Service: Cardiovascular;  Laterality: N/A;    Family History  Problem Relation Age of Onset  . Coronary artery disease Brother     Had MI in his 35 yo  . Hypertension Mother   . Hypertension Brother   . Hypertension Brother     Social History Mr. Krampitz reports that he has never smoked. He has never used smokeless tobacco. Mr. Misiaszek reports that he drinks about 0.6 oz of alcohol per week.  Review of Systems CONSTITUTIONAL: + fatigue HEENT: Eyes: No visual loss, blurred vision, double vision or yellow sclerae. No hearing loss, sneezing, congestion, runny nose or sore throat.  SKIN: No rash or itching.  CARDIOVASCULAR: per HPI RESPIRATORY: No shortness of breath, cough or sputum.  GASTROINTESTINAL: No anorexia, nausea, vomiting or diarrhea. No abdominal pain or blood.  GENITOURINARY: no  polyuria, no dysuria NEUROLOGICAL: No headache, dizziness, syncope, paralysis, ataxia, numbness or tingling in the extremities. No change in bowel or bladder control.  MUSCULOSKELETAL: No muscle, back pain, joint pain or stiffness.  HEMATOLOGIC: No anemia, bleeding or bruising.  LYMPHATICS: No enlarged nodes. No history of splenectomy.  PSYCHIATRIC: No history of depression or anxiety.      Physical Examination Blood pressure 143/89, pulse 66, temperature 98.5 F (36.9 C), temperature source Oral, resp. rate 17, SpO2 99 %. No intake or output  data in the 24 hours ending 04/27/15 1648  HEENT: sclera clear  Cardiovascular: RRR, no m/r/g, no JVD  Respiratory:CTAB  GI: abdomen soft, NT, ND  MSK: no LE edema  Neuro: no focal deficits    Lab Results  Basic Metabolic Panel:  Recent Labs Lab 04/27/15 1405  NA 137  K 3.9  CL 102  CO2 25  GLUCOSE 114*  BUN 9  CREATININE 1.31*  CALCIUM 9.6    Liver Function Tests: No results for input(s): AST, ALT, ALKPHOS, BILITOT, PROT, ALBUMIN in the last 168 hours.  CBC:  Recent Labs Lab 04/27/15 1405  WBC 6.7  HGB 15.8  HCT 44.9  MCV 91.3  PLT 236    Cardiac Enzymes: No results for input(s): CKTOTAL, CKMB, CKMBINDEX, TROPONINI in the last 168 hours.  BNP: Invalid input(s): POCBNP    Impression/Recommendations  1. Chest pain - non-specific symptoms of significant fatigue and DOE, can often have palpitations and chest pain associated - CAD history  With prior stenting - will admit overnight and cycle enzymes. We will change his previously scheduled outpatient stress test to inpatient. Monitor on tele overnight.   Dina Rich, M.D.

## 2015-04-27 NOTE — ED Notes (Signed)
Pt returned from Rad

## 2015-04-27 NOTE — ED Notes (Signed)
Charge nurse will call for report

## 2015-04-27 NOTE — ED Provider Notes (Addendum)
CSN: [id number]     Arrival date & time 04/27/15  1340 History   First MD Initiated Contact with Patient 04/27/15 1423     Chief Complaint  Patient presents with  . Chest Pain     (Consider location/radiation/quality/duration/timing/severity/associated sxs/prior Treatment) HPI Complains of chest pain intermittent for the past 4 days pain lasts anywhere from 30 seconds to 3 minutes at a time. Left-sided parasternal nonradiating. Associated symptoms include fatigue, inability to walk further than across the room for approximately the past 10 days, due to fatigue. Patient states at baseline he could walk 3 miles. He is presently pain-free. Denies other associated symptoms no shortness of breath no nausea no sweatiness. Patient was evaluated by Dr.McAlhany in the office on 04/12/2015. Outpatient Myoview study ordered. Patient states he is scheduled to have it on 04/30/2015. Nothing makes symptoms better or worse. No other associated symptoms. No treatment prior to coming Past Medical History  Diagnosis Date  . HTN (hypertension)   . HLD (hyperlipidemia)   . Family history of early CAD     Brother had MI at age 10 yo.  . Coronary artery disease     late presentation with ant STEMI 11/12: LHC 10/12/11: oLM 20%, LAD occluded with dissection 2/2 MI.  PCI with BMS to LAD with poor distal runoff; small caliber apical vessel c/w edematous apex from delayed presentation.  . ST elevation myocardial infarction (STEMI) of anterior wall 09/2011    s/p BMS to occluded LAD  . Cardiomyopathy, ischemic 09/2011    EF 40-45%;  echo 10/16/11: Mild LVH, EF 45-50%, very apical akinesis, LV apical thrombus, grade 2 diastolic dysfunction, trivial MR, PASP 29.  . 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 Elevated LFTs     Abdominal ultrasound was unremarkable  . PSVT (paroxysmal supraventricular tachycardia)     s/p adenocard 12 mg IVP 10/2011  . PE (pulmonary embolism)    Past Surgical History   Procedure Laterality Date  . Wrist surgery      cyst removal   . Leg surgery      cyst removal  . Coronary angiogram N/A 10/12/2011    Procedure: CORONARY ANGIOGRAM;  Surgeon: Herby Abraham, MD;  Location: [facility] CATH LAB;  Service: Cardiovascular;  Laterality: N/A;  . Percutaneous stent intervention N/A 10/12/2011    Procedure: PERCUTANEOUS STENT INTERVENTION;  Surgeon: Herby Abraham, MD;  Location: [facility] CATH LAB;  Service: Cardiovascular;  Laterality: N/A;   Family History  Problem Relation Age of Onset  . Coronary artery disease Brother     Had MI in his 33 yo  . Hypertension Mother   . Hypertension Brother   . Hypertension Brother    History  Substance Use Topics  . Smoking status: Never Smoker   . Smokeless tobacco: Never Used  . Alcohol Use: 0.6 oz/week    1 Cans of beer per week     Comment: socially    Review of Systems  Constitutional: Positive for fatigue.  HENT: Negative.   Respiratory: Negative.   Cardiovascular: Positive for chest pain.  Gastrointestinal: Negative.   Musculoskeletal: Negative.   Skin: Negative.   Neurological: Negative.   Psychiatric/Behavioral: Negative.   All other systems reviewed and are negative.     Allergies  Lisinopril  Home Medications   Prior to Admission medications   Medication Sig Start Date End Date Taking? Authorizing Provider  acetaminophen (TYLENOL) 325 MG tablet Take 650 mg by mouth every 6 (six) hours  as needed for mild pain.    Historical Provider, MD  ELIQUIS 5 MG TABS tablet Take 1 tablet by mouth 2 (two) times daily. 04/10/15   Historical Provider, MD  ibuprofen (ADVIL,MOTRIN) 200 MG tablet Take 400 mg by mouth every 6 (six) hours as needed for headache.    Historical Provider, MD  losartan (COZAAR) 100 MG tablet TAKE 1 TABLET BY MOUTH EVERY DAY 08/30/14   Kathleene Hazel, MD  metoprolol (LOPRESSOR) 50 MG tablet Take 1 tablet (50 mg total) by mouth 2 (two) times daily. 10/15/14   Kathleene Hazel,  MD  nitroGLYCERIN (NITROSTAT) 0.4 MG SL tablet Place 0.4 mg under the tongue every 5 (five) minutes as needed for chest pain.     Historical Provider, MD  pravastatin (PRAVACHOL) 80 MG tablet Take 1 tablet (80 mg total) by mouth daily. 07/23/14   Kathleene Hazel, MD   BP 140/82 mmHg  Pulse 66  Temp(Src) 98.5 F (36.9 C) (Oral)  Resp 18  SpO2 100% Physical Exam  Constitutional: He appears well-developed and well-nourished.  HENT:  Head: Normocephalic and atraumatic.  Eyes: Conjunctivae are normal. Pupils are equal, round, and reactive to light.  Neck: Neck supple. No tracheal deviation present. No thyromegaly present.  Cardiovascular: Normal rate and regular rhythm.   No murmur heard. Pulmonary/Chest: Effort normal and breath sounds normal.  Abdominal: Soft. Bowel sounds are normal. He exhibits no distension. There is no tenderness.  Musculoskeletal: Normal range of motion. He exhibits no edema or tenderness.  Neurological: He is alert. Coordination normal.  Skin: Skin is warm and dry. No rash noted.  Psychiatric: He has a normal mood and affect.  Nursing note and vitals reviewed.   ED Course  Procedures (including critical care time) Labs Review Labs Reviewed  CBC  BASIC METABOLIC PANEL  BRAIN NATRIURETIC PEPTIDE  I-STAT TROPOININ, ED    Imaging Review No results found.   EKG Interpretation   Date/Time:  Saturday April 27 2015 13:46:41 EDT Ventricular Rate:  68 PR Interval:  128 QRS Duration: 78 QT Interval:  368 QTC Calculation: 391 R Axis:   121 Text Interpretation:  Normal sinus rhythm Possible Inferior infarct , age  undetermined Anterolateral infarct , age undetermined Abnormal ECG No  significant change since last tracing Confirmed by Ethelda Chick  MD, Nathian Stencil  [phone]) on 04/27/2015 1:54:11 PM     Chest x-ray viewed by me Results for orders placed or performed during the hospital encounter of 04/27/15  CBC  Result Value Ref Range   WBC 6.7 4.0 - 10.5  K/uL   RBC 4.92 4.22 - 5.81 MIL/uL   Hemoglobin 15.8 13.0 - 17.0 g/dL   HCT 60.4 54.0 - 98.1 %   MCV 91.3 78.0 - 100.0 fL   MCH 32.1 26.0 - 34.0 pg   MCHC 35.2 30.0 - 36.0 g/dL   RDW 19.1 47.8 - 29.5 %   Platelets 236 150 - 400 K/uL  Basic metabolic panel  Result Value Ref Range   Sodium 137 135 - 145 mmol/L   Potassium 3.9 3.5 - 5.1 mmol/L   Chloride 102 101 - 111 mmol/L   CO2 25 22 - 32 mmol/L   Glucose, Bld 114 (H) 65 - 99 mg/dL   BUN 9 6 - 20 mg/dL   Creatinine, Ser 6.21 (H) 0.61 - 1.24 mg/dL   Calcium 9.6 8.9 - 30.8 mg/dL   GFR calc non Af Amer >60 >60 mL/min   GFR calc Af Amer >60 >  60 mL/min   Anion gap 10 5 - 15  BNP (order ONLY if patient complains of dyspnea/SOB AND you have documented it for THIS visit)  Result Value Ref Range   B Natriuretic Peptide 14.4 0.0 - 100.0 pg/mL  I-stat troponin, ED  (not at [facility])  Result Value Ref Range   Troponin i, poc 0.00 0.00 - 0.08 ng/mL   Comment 3           Dg Chest 2 View  04/27/2015   CLINICAL DATA:  Chest pain.  EXAM: CHEST  2 VIEW  COMPARISON:  04/02/2015  FINDINGS: The heart size and mediastinal contours are within normal limits. Both lungs are clear. The visualized skeletal structures are unremarkable.  IMPRESSION: Normal chest.   Electronically Signed   By: Francene Boyers M.D.   On: 04/27/2015 15:04   Dg Chest 2 View  04/02/2015   CLINICAL DATA:  49 year old male with shortness of breath and fatigue for 2 days. Initial encounter.  EXAM: CHEST  2 VIEW  COMPARISON:  01/18/2014 and earlier.  FINDINGS: Lower lung volumes. Stable cardiac size at the upper limits of normal. Other mediastinal contours are within normal limits. Visualized tracheal air column is within normal limits. No pneumothorax, pulmonary edema, pleural effusion or consolidation. Crowding of lung markings. No confluent pulmonary opacity. No acute osseous abnormality identified.  IMPRESSION: Low lung volumes, otherwise no acute cardiopulmonary abnormality.    Electronically Signed   By: Odessa Fleming M.D.   On: 04/02/2015 09:47   Ct Angio Chest Pe W/cm &/or Wo Cm  04/04/2015   CLINICAL DATA:  Shortness of breath for 3 days.  EXAM: CT ANGIOGRAPHY CHEST WITH CONTRAST  TECHNIQUE: Multidetector CT imaging of the chest was performed using the standard protocol during bolus administration of intravenous contrast. Multiplanar CT image reconstructions and MIPs were obtained to evaluate the vascular anatomy.  CONTRAST:  120 mL Isovue 370  COMPARISON:  Chest CT 01/17/2012  FINDINGS: Pulmonary arterial opacification is adequate. No central pulmonary emboli are identified. A very small amount of nonocclusive thrombus is present in distal segmental and proximal subsegmental basilar left lower lobe pulmonary arteries (series 4 images 111-117). There is thinning of the left ventricular apical myocardium compatible with known prior infarction. There is an approximately 2 cm rounded filling defect which appears to be in the lumen of the left ventricular apex, different in configuration than the abnormality described on the prior study. A coronary artery stent is again seen.  No enlarged axillary, mediastinal, or hilar lymph nodes are identified. There is no pleural or pericardial effusion. There is mild respiratory motion artifact with dependent subsegmental atelectasis in the lower lobes. No lung mass or nodules are identified allowing for the motion artifact. Major airways are patent.  There is a 1 cm focus of ill-defined, non masslike enhancement peripherally in the posterior right hepatic lobe which may reflect a perfusion anomaly given arterial phase imaging. No acute osseous abnormality is identified.  Review of the MIP images confirms the above findings.  IMPRESSION: 1. No central pulmonary emboli. Very small amount of nonocclusive distal segmental/proximal subsegmental left lower lobe embolus. 2. Myocardial thinning of the left ventricular apex consistent with prior infarction.  Adjacent filling defect may reflect thrombus in the left ventricular apex. Consider echocardiogram for further evaluation. Critical Value/emergent results were called by telephone at the time of interpretation on 04/04/2015 at 11:07 am to Dr. Georgann Housekeeper , who verbally acknowledged these results.   Electronically Signed  By: Sebastian Ache   On: 04/04/2015 11:09   Mr Brain W Wo Contrast  04/02/2015   CLINICAL DATA:  Gait instability.  Fatigue.  Short of breath.  EXAM: MRI HEAD WITHOUT AND WITH CONTRAST  TECHNIQUE: Multiplanar, multiecho pulse sequences of the brain and surrounding structures were obtained without and with intravenous contrast.  CONTRAST:  15mL MULTIHANCE GADOBENATE DIMEGLUMINE 529 MG/ML IV SOLN  COMPARISON:  None.  FINDINGS: Ventricle size is normal. Cerebral volume is normal. Pituitary normal in size. Craniocervical junction normal. Negative for Chiari malformation.  Mild mucosal edema in the paranasal sinuses without air-fluid level. Calvarium intact. Negative orbit.  Negative for acute or chronic infarction  Negative for demyelinating disease. Cerebral white matter normal. Brainstem and cerebellum normal.  Negative for intracranial hemorrhage or fluid collection  Negative for mass or edema  Normal enhancement following contrast infusion. No enhancing mass lesion. Leptomeningeal enhancement normal.  IMPRESSION: Normal MRI of the brain with contrast  Mild mucosal edema in the paranasal sinuses.   Electronically Signed   By: Marlan Palau M.D.   On: 04/02/2015 17:30    MDM  . Pt not given asa as is on eliquis.Spoke with cardiology service. Patient will be admitted by cardiology service Final diagnoses:  None   diagnosis #1unstable angina #2renal insuffiicency      Doug Sou, MD 04/27/15 1706  Doug Sou, MD 04/27/15 8295

## 2015-04-27 NOTE — ED Notes (Signed)
Report given receiving RN on 2W

## 2015-04-27 NOTE — ED Notes (Signed)
Patient denies pain and is resting comfortably. Cardiology at bedside

## 2015-04-27 NOTE — ED Notes (Signed)
Attempted to call report. No answer at present time

## 2015-04-28 ENCOUNTER — Observation Stay (HOSPITAL_COMMUNITY): Payer: BLUE CROSS/BLUE SHIELD

## 2015-04-28 DIAGNOSIS — I2510 Atherosclerotic heart disease of native coronary artery without angina pectoris: Secondary | ICD-10-CM | POA: Diagnosis not present

## 2015-04-28 DIAGNOSIS — R5383 Other fatigue: Secondary | ICD-10-CM

## 2015-04-28 LAB — BASIC METABOLIC PANEL
Anion gap: 7 (ref 5–15)
BUN: 7 mg/dL (ref 6–20)
CHLORIDE: 103 mmol/L (ref 101–111)
CO2: 26 mmol/L (ref 22–32)
Calcium: 9.3 mg/dL (ref 8.9–10.3)
Creatinine, Ser: 0.87 mg/dL (ref 0.61–1.24)
GFR calc Af Amer: >60 mL/min (ref >60)
Glucose, Bld: 140 mg/dL — ABNORMAL HIGH (ref 65–99)
Potassium: 3.6 mmol/L (ref 3.5–5.1)
SODIUM: 136 mmol/L (ref 135–145)

## 2015-04-28 LAB — NM MYOCAR MULTI W/SPECT W/WALL MOTION / EF
LHR: 0.34
LV sys vol: 46 mL
LVDIAVOL: 86 mL
Nuc Stress EF: 47 %
SDS: 0
SRS: 15
SSS: 15
TID: 0.8

## 2015-04-28 LAB — TROPONIN I: Troponin I: <0.03 ng/mL (ref <0.031)

## 2015-04-28 LAB — LIPID PANEL
Cholesterol: 131 mg/dL (ref 0–200)
HDL: 41 mg/dL (ref >40)
LDL Cholesterol: 82 mg/dL (ref 0–99)
Total CHOL/HDL Ratio: 3.2 RATIO
Triglycerides: 39 mg/dL (ref <150)
VLDL: 8 mg/dL (ref 0–40)

## 2015-04-28 LAB — CBC
HEMATOCRIT: 41.4 % (ref 39.0–52.0)
HEMOGLOBIN: 14.9 g/dL (ref 13.0–17.0)
MCH: 32.4 pg (ref 26.0–34.0)
MCHC: 36 g/dL (ref 30.0–36.0)
MCV: 90 fL (ref 78.0–100.0)
Platelets: 241 10*3/uL (ref 150–400)
RBC: 4.6 MIL/uL (ref 4.22–5.81)
RDW: 12.6 % (ref 11.5–15.5)
WBC: 5.4 10*3/uL (ref 4.0–10.5)

## 2015-04-28 MED ORDER — SERTRALINE HCL 50 MG PO TABS: 25.0000 mg | ORAL_TABLET | Freq: Every day | ORAL | Status: DC

## 2015-04-28 MED ORDER — REGADENOSON 0.4 MG/5ML IV SOLN
INTRAVENOUS | Status: AC
  Administered 2015-04-28: 10:00:00
  Filled 2015-04-28: qty 5

## 2015-04-28 MED ORDER — TECHNETIUM TC 99M SESTAMIBI - CARDIOLITE
30.0000 | Freq: Once | INTRAVENOUS | Status: AC | PRN
  Administered 2015-04-28: 30 via INTRAVENOUS

## 2015-04-28 MED ORDER — METOPROLOL TARTRATE 50 MG PO TABS: 25.0000 mg | ORAL_TABLET | Freq: Two times a day (BID) | ORAL | Status: DC

## 2015-04-28 MED ORDER — CLONAZEPAM 0.5 MG PO TABS
0.5000 mg | ORAL_TABLET | Freq: Every evening | ORAL | Status: DC | PRN
Start: 2015-04-28 — End: 2015-04-28
  Administered 2015-04-28: 0.5 mg via ORAL
  Filled 2015-04-28: qty 1

## 2015-04-28 MED ORDER — TECHNETIUM TC 99M SESTAMIBI GENERIC - CARDIOLITE
10.0000 | Freq: Once | INTRAVENOUS | Status: AC | PRN
  Administered 2015-04-28: 10 via INTRAVENOUS

## 2015-04-28 NOTE — Discharge Summary (Signed)
Physician Discharge Summary  Patient ID: Rodney Marquez MRN: [id number] DOB/AGE: 1966-04-30 49 y.o.   Primary Cardiologist: Dr. Clifton Rose Marquez.   Admit date: 04/27/2015 Discharge date: 04/28/2015  Admission Diagnoses: Chest Pain   Discharge Diagnoses:  Active Problems:   Chest pain   Discharged Condition: stable  Hospital Course: Rodney Marquez is a 49 y.o.male hx of HTN, HLD, PSVT, CAD with prior anterior STEMI 09/2011 w/ BMS to LAD, hx of apical thrombus and completed course of anticoag who presented to the [facility] ED 04/27/15 with chest pain. There is a mention of a prior history of PE for which he has been on eliquis. He was recently seen by Dr Rodney Marquez in clinic with plans for outpatient stress testing and an outpatient monitor for palpitations, however his symptoms progressed and he presented to the ER. He reported not feeling well since May. Episodes of severe fatigue, SOB/DOE that makes it difficult walking short distances. He also noted occasional palpitations, lasting just a few seconds. Intermittent mild left sided chest pain also occur with episodes, lasts a few seconds to minutes. In ED, BNP 14, K 3.9, Cr 1.31, Hgb 15.8, Plt 236. CXR showed no acute process. EKG demonstrated RAD, SR, anterior and anterolateral Qwaves and TWI old.   He was admitted for observation. Cardiac enzymes were negative x 3. No EKG changes. He underwent a NST that was negative for ischemia. EF 45-54%. No further inpatient w/u was felt necessary. Of note, it was also felt that his BB may be contributing to fatigue. His metoprolol was reduced to 25 mg BID. It was also recommenced that he increase his Zoloft to daily to help with anxiety. He was last seen and examined by Dr. Johney Marquez who determined he was stable for discharge home. He has an office f/u visit with Dr. Clifton Mackay Marquez scheduled for 05/02/15.      Consults: None  Significant Diagnostic Studies:  Cardiac Panel (last 3 results)  Recent Labs  04/27/15 1915 04/28/15  04/28/15 1240  TROPONINI <0.03 <0.03 <0.03    LHC 04/28/15  Findings consistent with prior myocardial infarction.  The left ventricular ejection fraction is mildly decreased (45-54%).  This is an intermediate risk study.  Defect 1: There is a large defect of moderate severity present in the basal inferolateral, mid inferolateral and apex location.   Treatments: See Hospital Course  Discharge Exam: Blood pressure 126/84, pulse 75, temperature 98.8 F (37.1 C), temperature source Oral, resp. rate 18, height  (1.702 m), weight 167 lb 1.6 oz (75.796 kg), SpO2 100 %.   Disposition: 01-Home or Self Care      Discharge Instructions    Diet - low sodium heart healthy    Complete by:  As directed      Increase activity slowly    Complete by:  As directed             Medication List    STOP taking these medications        ibuprofen 200 MG tablet  Commonly known as:  ADVIL,MOTRIN      TAKE these medications        acetaminophen 325 MG tablet  Commonly known as:  TYLENOL  Take 650 mg by mouth every 6 (six) hours as needed for mild pain.     ELIQUIS 5 MG Tabs tablet  Generic drug:  apixaban  Take 1 tablet by mouth 2 (two) times daily.     losartan 100 MG tablet  Commonly known as:  COZAAR  TAKE 1 TABLET BY MOUTH EVERY DAY     metoprolol 50 MG tablet  Commonly known as:  LOPRESSOR  Take 0.5 tablets (25 mg total) by mouth 2 (two) times daily.  Notes to Patient:  Half pill in the morning and half pill in the evening     nitroGLYCERIN 0.4 MG SL tablet  Commonly known as:  NITROSTAT  Place 0.4 mg under the tongue every 5 (five) minutes as needed for chest pain.     pravastatin 80 MG tablet  Commonly known as:  PRAVACHOL  Take 1 tablet (80 mg total) by mouth daily.     sertraline 50 MG tablet  Commonly known as:  ZOLOFT  Take 0.5 tablets (25 mg total) by mouth daily.       Follow-up Information    Follow up with Rodney Carrow, MD On 05/02/2015.    Specialty:  Cardiology   Why:  9:00 AM   Contact information:   [address] [place] [address] [phone]       TIME SPENT ON DISCHARGE, INCLUDING PHYSICIAN TIME: >30 MINUTES  Signed: Robbie Marquez 04/28/2015, 2:14 PM   Rodney Range MD, [facility] 04/28/2015 3:31 PM

## 2015-04-28 NOTE — Progress Notes (Signed)
   SUBJECTIVE: The patient is doing well today.  At this time, he denies chest pain, shortness of breath, or any new concerns.  Concerned about fatigue.  Has been taking zoloft as needed.  [name] apixaban  5 mg Oral BID  . aspirin EC  81 mg Oral Daily  . losartan  100 mg Oral Daily  . metoprolol  50 mg Oral BID  . pravastatin  80 mg Oral Daily  . sertraline  25 mg Oral Daily      OBJECTIVE: Physical Exam: Filed Vitals:   04/28/15 0944 04/28/15 0946 04/28/15 0948 04/28/15 1159  BP:  144/93 143/97 126/84  Pulse: 116 114 106 75  Temp:      TempSrc:      Resp:      Height:      Weight:      SpO2:        Intake/Output Summary (Last 24 hours) at 04/28/15 1210 Last data filed at 04/28/15 0341  Gross per 24 hour  Intake      0 ml  Output    350 ml  Net   -350 ml    Telemetry reveals sinus rhythm, no arrhythmias  GEN- The patient is anxious appearing, alert and oriented x 3 today.   Head- normocephalic, atraumatic Eyes-  Sclera clear, conjunctiva pink Ears- hearing intact Oropharynx- clear Neck- supple, no JVP Lymph- no cervical lymphadenopathy Lungs- Clear to ausculation bilaterally, normal work of breathing Heart- Regular rate and rhythm, no murmurs, rubs or gallops, PMI not laterally displaced GI- soft, NT, ND, + BS Extremities- no clubbing, cyanosis, or edema Skin- no rash or lesion Psych- euthymic mood, full affect Neuro- strength and sensation are intact  LABS: Basic Metabolic Panel:  Recent Labs  84/69/62 1405 04/27/15 1915  NA 137  --   K 3.9  --   CL 102  --   CO2 25  --   GLUCOSE 114*  --   BUN 9  --   CREATININE 1.31* 1.17  CALCIUM 9.6  --    Liver Function Tests: No results for input(s): AST, ALT, ALKPHOS, BILITOT, PROT, ALBUMIN in the last 72 hours. No results for input(s): LIPASE, AMYLASE in the last 72 hours. CBC:  Recent Labs  04/27/15 1405 04/27/15 1915  WBC 6.7 6.9  HGB 15.8 15.2  HCT 44.9 43.1  MCV 91.3 91.3  PLT 236 234    Cardiac Enzymes:  Recent Labs  04/27/15 1915 04/28/15  TROPONINI <0.03 <0.03   BNP: Invalid input(s): POCBNP D-Dimer: No results for input(s): DDIMER in the last 72 hours. Hemoglobin A1C: No results for input(s): HGBA1C in the last 72 hours. Fasting Lipid Panel:  Recent Labs  04/28/15 0519  CHOL 131  HDL 41  LDLCALC 82  TRIG 39  CHOLHDL 3.2   Thyroid Function Tests:  Recent Labs  04/27/15 1915  TSH 0.615   ASSESSMENT AND PLAN:  Active Problems:   Chest pain  1. CAD Known cad with prior MI myoview is reviewed and reveals no ischemic Continue medical therapy and follow-up with Dr Clifton Jwan Hornbaker  2. Fatigue/ anxiety Reduce metoprolol to 25mg  BID at discharge He takes zoloft only prn.  I have instructed him to take daily  3. Prior pte On eliquis per PCP  DC to home today   Hillis Range, MD 04/28/2015 12:10 PM

## 2015-04-29 ENCOUNTER — Encounter (HOSPITAL_COMMUNITY): Payer: BLUE CROSS/BLUE SHIELD

## 2015-04-29 ENCOUNTER — Ambulatory Visit: Payer: BLUE CROSS/BLUE SHIELD | Admitting: Nurse Practitioner

## 2015-04-30 ENCOUNTER — Ambulatory Visit (INDEPENDENT_AMBULATORY_CARE_PROVIDER_SITE_OTHER): Payer: BLUE CROSS/BLUE SHIELD

## 2015-04-30 DIAGNOSIS — R002 Palpitations: Secondary | ICD-10-CM | POA: Diagnosis not present

## 2015-05-01 NOTE — Progress Notes (Signed)
Chief Complaint  Patient presents with  . Palpitations     History of Present Illness: 49 yo with history of CAD, HTN, HLD, SVT, ischemic cardiomyopathy who is here today for cardiac follow up. He has been followed in the past by Dr. Riley Kill. He had an anterior STEMI 10/12/11 and found to have occluded LAD. He had a bare metal stent placed in the LAD. He was treated with anti-coagulation following d/c for LV apical thrombus but has been off since 2013. Last echo in 2013 with LVEF over 60%. He was seen in the [facility] ED with weakness 04/02/15 and had negative head MRI. Labs ok. Per pt primary care found a PE on CTA and started Eliquis. I do not have these records. Also echo ordered by primary care in our office with apical akinesis and possible LV apical thrombus noted, as seen in the past. He has been started on Eliquis. I saw him 04/12/15 and he was still feeling poorly. I arranged a stress myoview and 48 hour monitor. He did not complete these tests. He was admitted to [facility] 04/27/15 with fatigue and had inpatient stress myoview with no ischemia. Metoprolol dose reduced to 25 mg twice daily.   He is here today for follow up. He is still feeling poorly but overall better than three weeks ago. No chest pain or SOB. Still feeling some palpitations. No near syncope.   Primary Care Physician: [name]  Last Lipid Profile:Lipid Panel     Component Value Date/Time   CHOL 131 04/28/2015 0519   TRIG 39 04/28/2015 0519   HDL 41 04/28/2015 0519   CHOLHDL 3.2 04/28/2015 0519   VLDL 8 04/28/2015 0519   LDLCALC 82 04/28/2015 0519     Past Medical History  Diagnosis Date  . HTN (hypertension)   . HLD (hyperlipidemia)   . Family history of early CAD     Brother had MI at age 4 yo.  . Coronary artery disease     late presentation with ant STEMI 11/12: LHC 10/12/11: oLM 20%, LAD occluded with dissection 2/2 MI.  PCI with BMS to LAD with poor distal runoff; small caliber apical vessel c/w edematous  apex from delayed presentation.  . ST elevation myocardial infarction (STEMI) of anterior wall 09/2011    s/p BMS to occluded LAD  . Cardiomyopathy, ischemic 09/2011    EF 40-45%;  echo 10/16/11: Mild LVH, EF 45-50%, very apical akinesis, LV apical thrombus, grade 2 diastolic dysfunction, trivial MR, PASP 29.  . 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 Elevated LFTs     Abdominal ultrasound was unremarkable  . PSVT (paroxysmal supraventricular tachycardia)     s/p adenocard 12 mg IVP 10/2011  . PE (pulmonary embolism)     Past Surgical History  Procedure Laterality Date  . Wrist surgery      cyst removal   . Leg surgery      cyst removal  . Coronary angiogram N/A 10/12/2011    Procedure: CORONARY ANGIOGRAM;  Surgeon: Herby Abraham, MD;  Location: [facility] CATH LAB;  Service: Cardiovascular;  Laterality: N/A;  . Percutaneous stent intervention N/A 10/12/2011    Procedure: PERCUTANEOUS STENT INTERVENTION;  Surgeon: Herby Abraham, MD;  Location: [facility] CATH LAB;  Service: Cardiovascular;  Laterality: N/A;    Current Outpatient Prescriptions  Medication Sig Dispense Refill  . acetaminophen (TYLENOL) 325 MG tablet Take 650 mg by mouth every 6 (six) hours as needed for mild pain.    [name]  clonazePAM (KLONOPIN) 1 MG tablet Take 1 mg by mouth 2 (two) times daily.  0  . ELIQUIS 5 MG TABS tablet Take 1 tablet by mouth 2 (two) times daily.    [name] losartan (COZAAR) 100 MG tablet TAKE 1 TABLET BY MOUTH EVERY DAY 30 tablet 11  . metoprolol (LOPRESSOR) 50 MG tablet Take 0.5 tablets (25 mg total) by mouth 2 (two) times daily. 180 tablet 1  . nitroGLYCERIN (NITROSTAT) 0.4 MG SL tablet Place 0.4 mg under the tongue every 5 (five) minutes as needed for chest pain.     . pravastatin (PRAVACHOL) 80 MG tablet Take 1 tablet (80 mg total) by mouth daily. 30 tablet 0  . sertraline (ZOLOFT) 50 MG tablet Take 0.5 tablets (25 mg total) by mouth daily.     No current facility-administered  medications for this visit.    Allergies  Allergen Reactions  . Lisinopril Cough    History   Social History  . Marital Status: Single    Spouse Name: N/A  . Number of Children: N/A  . Years of Education: N/A   Occupational History  . Not on file.   Social History Main Topics  . Smoking status: Never Smoker   . Smokeless tobacco: Never Used  . Alcohol Use: 0.6 oz/week    1 Cans of beer per week     Comment: socially  . Drug Use: No  . Sexual Activity: Yes    Birth Control/ Protection: None   Other Topics Concern  . Not on file   Social History Narrative    Family History  Problem Relation Age of Onset  . Coronary artery disease Brother     Had MI in his 30 yo  . Hypertension Mother   . Hypertension Brother   . Hypertension Brother     Review of Systems:  As stated in the HPI and otherwise negative.   BP 132/90 mmHg  Pulse 73  Ht  (1.702 m)  Wt 170 lb 12.8 oz (77.474 kg)  BMI 26.74 kg/m2  Physical Examination: General: Well developed, well nourished, NAD HEENT: OP clear, mucus membranes moist SKIN: warm, dry. No rashes. Neuro: No focal deficits Musculoskeletal: Muscle strength 5/5 all ext Psychiatric: Mood and affect normal Neck: No JVD, no carotid bruits, no thyromegaly, no lymphadenopathy. Lungs:Clear bilaterally, no wheezes, rhonci, crackles Cardiovascular: Regular rate and rhythm. No murmurs, gallops or rubs. Abdomen:Soft. Bowel sounds present. Non-tender.  Extremities: No lower extremity edema. Pulses are 2 + in the bilateral DP/PT.  Echo 04/05/15: Left ventricle: LVEF is approximately 40% with akinesis of the distal 1/4 of LV Large mural thrombus at apex (15 x 17 mm) The cavity size was normal. Wall thickness was normal. Doppler parameters are consistent with abnormal left ventricular relaxation (grade 1 diastolic dysfunction). - Mitral valve: There was mild regurgitation.  Cardiac cath 10/12/11: Left mainstem: The left main  coronary artery was without critical narrowing. There was mild ostial narrowing of 20%.  Left anterior descending (LAD): The left anterior descending artery was totally occluded just beyond the origin of the second diagonal branch. The vessel proximal to the site, including the first diagonal and septal, were free of significant disease. At the site of total occlusion, the vessel was dissected with TIMI 0 flow. Following wiring, and balloon dilatation, the vessel was opened demonstrating an infarction related dissection. Despite balloon dilatation, there was very poor distal runoff. A 2 mm x 28 mm non-drug-eluting mini vision stent was placed and post  dilated. Following passage of the balloon distally, there was TIMI 2 flow into a small caliber apical vessel. This was most consistent with an edematous apex due to delayed presentation.  Left circumflex (LCx): The circumflex artery consisted of 2 major marginal branches, both of which were free of significant disease.  Right coronary artery (RCA): The right coronary artery was nonselectively injected. It has an anterior takeoff from the left coronary cusp. It will require an AMPLATZ in the future to engage. It was well visualized and provided a posterior descending and posterolateral branch, both of which were free of disease.   Stress myoview 04/28/15: Nuclear Study Quality Overall image quality is good.     Nuclear Measurements Study was gated.    Rest Perfusion There is a defect present in the basal inferolateral, mid inferolateral and apex location.    Stress Perfusion There is a defect present in the basal inferolateral, mid inferolateral and apex location.    Perfusion Summary Defect 1:  There is a large defect of moderate severity present in the basal inferolateral, mid inferolateral and apex location. The defect is non-reversible.    Study Impression Myocardial perfusion is abnormal. Findings consistent with prior myocardial  infarction. This is an intermediate risk study. Overall left ventricular systolic function was abnormal. LV cavity size is normal. The left ventricular ejection fraction is mildly decreased (45-54%). There is no prior study for comparison.       EKG:  EKG is not ordered today.   Recent Labs: 04/02/2015: ALT 21 04/27/2015: B Natriuretic Peptide 14.4; TSH 0.615 04/28/2015: BUN 7; Creatinine, Ser 0.87; Hemoglobin 14.9; Platelets 241; Potassium 3.6; Sodium 136   Lipid Panel    Component Value Date/Time   CHOL 131 04/28/2015 0519   TRIG 39 04/28/2015 0519   HDL 41 04/28/2015 0519   CHOLHDL 3.2 04/28/2015 0519   VLDL 8 04/28/2015 0519   LDLCALC 82 04/28/2015 0519     Wt Readings from Last 3 Encounters:  05/02/15 170 lb 12.8 oz (77.474 kg)  04/27/15 167 lb 1.6 oz (75.796 kg)  04/12/15 174 lb 3.2 oz (79.017 kg)     Other studies Reviewed: Additional studies/ records that were reviewed today include: . Review of the above records demonstrates:    Assessment and Plan:   1. CAD: Stable. Recent fatigue and weakness but no ischemia on stress test 04/28/15. This does not sound like angina. No change in medications.  2. HLD: On a statin. Lipids followed in primary care  3. HTN: BP is controlled. No changes.    4. Palpitations: Will take off monitor today and call with results. Will continue metoprolol 25 mg po BID for now.   5. PE/possible LV thrombus: on Eliquis per primary care  Current medicines are reviewed at length with the patient today.  The patient does not have concerns regarding medicines.  The following changes have been made:  no change  Labs/ tests ordered today include:   No orders of the defined types were placed in this encounter.    Disposition:   FU with me in 6 months  Signed, Verne Carrow, MD 05/02/2015 12:28 PM    [facility] [address] [place], [place], [place]  [address] Phone: [phone]; Fax: [phone]

## 2015-05-02 ENCOUNTER — Encounter: Payer: Self-pay | Admitting: Cardiovascular Disease

## 2015-05-02 ENCOUNTER — Ambulatory Visit (INDEPENDENT_AMBULATORY_CARE_PROVIDER_SITE_OTHER): Payer: BLUE CROSS/BLUE SHIELD | Admitting: Cardiovascular Disease

## 2015-05-02 VITALS — BP 132/90 | HR 73 | Ht 67.0 in | Wt 170.8 lb

## 2015-05-02 DIAGNOSIS — I10 Essential (primary) hypertension: Secondary | ICD-10-CM | POA: Diagnosis not present

## 2015-05-02 DIAGNOSIS — I213 ST elevation (STEMI) myocardial infarction of unspecified site: Secondary | ICD-10-CM

## 2015-05-02 DIAGNOSIS — I2510 Atherosclerotic heart disease of native coronary artery without angina pectoris: Secondary | ICD-10-CM | POA: Diagnosis not present

## 2015-05-02 DIAGNOSIS — R002 Palpitations: Secondary | ICD-10-CM | POA: Diagnosis not present

## 2015-05-02 DIAGNOSIS — E785 Hyperlipidemia, unspecified: Secondary | ICD-10-CM

## 2015-05-02 DIAGNOSIS — I513 Intracardiac thrombosis, not elsewhere classified: Secondary | ICD-10-CM

## 2015-05-02 NOTE — Patient Instructions (Signed)
Medication Instructions:  Your physician recommends that you continue on your current medications as directed. Please refer to the Current Medication list given to you today.   Labwork: none  Testing/Procedures: none  Follow-Up: Your physician wants you to follow-up in: 6 months.  You will receive a reminder letter in the mail two months in advance. If you don't receive a letter, please call our office to schedule the follow-up appointment.       

## 2015-05-06 ENCOUNTER — Telehealth: Payer: Self-pay | Admitting: *Deleted

## 2015-05-06 MED ORDER — METOPROLOL TARTRATE 50 MG PO TABS: 50.0000 mg | ORAL_TABLET | Freq: Two times a day (BID) | ORAL | Status: DC

## 2015-05-06 NOTE — Telephone Encounter (Signed)
Contacted the pt to inform him that [employer] sent an urgent fax in regards to the pts holter monitor results showing 1 run of V-tach on 04/30/15.  Pt saw Dr [name] on 6/9 and noted on the pts assessment and plan from OV that the monitor was d/c'ed, and Dr [name] will call with the monitor results and the pt to take metoprolol 25 mg po bid and follow-up with him in 6 months.  After receiving urgent fax from monitor company and noted that Dr [name] is out of the office, showed the full monitor results to the DOD Dr Katrinka Blazing.  Per Dr Katrinka Blazing the pt had non-sustained VT, rare PACs and PVCs, and the pt should increase his metoprolol to 50 mg po bid, and schedule a sooner follow-up appt with Dr [name] (before 6 months).   Informed the pt of his monitor results and recommendations per Dr Katrinka Blazing DOD.  Pt states he has metoprolol 50 mg on hand, for he used to be on this dose.  Sent an extra refill to the pts confirmed pharmacy of choice.  Informed the pt that Dr [name] and nurse are both out of the office this week, but will send them a message to contact him back next week to set up a follow-up appt with Dr [name], sooner than 6 months.  Pt verbalized understanding, agrees with this plan, and gracious for all the assistance provided.

## 2015-05-13 NOTE — Telephone Encounter (Signed)
[name], Can we look at the schedule and add him on for some time over the next few weeks?

## 2015-05-13 NOTE — Progress Notes (Signed)
thanks

## 2015-05-16 NOTE — Telephone Encounter (Signed)
Spoke with pt who reports he is feeling fine.  Appt made for him to see Dr. [name] on June 17, 2015 at 9:30

## 2015-06-17 ENCOUNTER — Encounter: Payer: BLUE CROSS/BLUE SHIELD | Admitting: Cardiovascular Disease

## 2015-06-17 ENCOUNTER — Other Ambulatory Visit: Payer: Self-pay | Admitting: *Deleted

## 2015-06-17 NOTE — Progress Notes (Signed)
cancel

## 2015-10-30 ENCOUNTER — Ambulatory Visit (INDEPENDENT_AMBULATORY_CARE_PROVIDER_SITE_OTHER): Payer: BLUE CROSS/BLUE SHIELD | Admitting: Cardiovascular Disease

## 2015-10-30 ENCOUNTER — Encounter: Payer: Self-pay | Admitting: Cardiovascular Disease

## 2015-10-30 VITALS — BP 100/70 | HR 68 | Ht 67.0 in | Wt 182.4 lb

## 2015-10-30 DIAGNOSIS — I10 Essential (primary) hypertension: Secondary | ICD-10-CM

## 2015-10-30 DIAGNOSIS — I2510 Atherosclerotic heart disease of native coronary artery without angina pectoris: Secondary | ICD-10-CM

## 2015-10-30 DIAGNOSIS — I513 Intracardiac thrombosis, not elsewhere classified: Secondary | ICD-10-CM

## 2015-10-30 DIAGNOSIS — I4729 Other ventricular tachycardia: Secondary | ICD-10-CM

## 2015-10-30 DIAGNOSIS — I213 ST elevation (STEMI) myocardial infarction of unspecified site: Secondary | ICD-10-CM

## 2015-10-30 DIAGNOSIS — I472 Ventricular tachycardia: Secondary | ICD-10-CM

## 2015-10-30 DIAGNOSIS — E785 Hyperlipidemia, unspecified: Secondary | ICD-10-CM | POA: Diagnosis not present

## 2015-10-30 NOTE — Progress Notes (Signed)
Chief Complaint  Patient presents with  . Follow-up    CAD PT HAS NO COMPLAINTS     History of Present Illness: 49 yo with history of CAD, HTN, HLD, SVT, ischemic cardiomyopathy who is here today for cardiac follow up. He has been followed in the past by Dr. Riley Kill. He had an anterior STEMI 10/12/11 and found to have occluded LAD. He had a bare metal stent placed in the LAD. He was treated with anti-coagulation following d/c for LV apical thrombus but has been off since 2013. Last echo in 2013 with LVEF over 60%. He was seen in the [facility] ED with weakness 04/02/15 and had negative head MRI. Labs ok. Per pt primary care found a PE on CTA and started Eliquis. I do not have these records. Also echo ordered by primary care in our office with apical akinesis and possible LV apical thrombus noted, as seen in the past. He has been started on Eliquis. I saw him 04/12/15 and he was still feeling poorly. I arranged a stress myoview and 48 hour monitor. He did not complete these tests. He was admitted to [facility] 04/27/15 with fatigue and had inpatient stress myoview with no ischemia. Metoprolol dose reduced to 25 mg twice daily. 48 hour monitor June 2016 with 12 beat run NSVT, rare PVCs, rare PACs. Lopressor dose was increased.   He is here today for follow up. He is feeling well. No chest pain or SOB. No bleeding issues. No palpitations. No near syncope.   Primary Care Physician: [name]  Past Medical History  Diagnosis Date  . HTN (hypertension)   . HLD (hyperlipidemia)   . Family history of early CAD     Brother had MI at age 73 yo.  . Coronary artery disease     late presentation with ant STEMI 11/12: LHC 10/12/11: oLM 20%, LAD occluded with dissection 2/2 MI.  PCI with BMS to LAD with poor distal runoff; small caliber apical vessel c/w edematous apex from delayed presentation.  . ST elevation myocardial infarction (STEMI) of anterior wall (HCC) 09/2011    s/p BMS to occluded LAD  . Cardiomyopathy,  ischemic 09/2011    EF 40-45%;  echo 10/16/11: Mild LVH, EF 45-50%, very apical akinesis, LV apical thrombus, grade 2 diastolic dysfunction, trivial MR, PASP 29.  . 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 Elevated LFTs     Abdominal ultrasound was unremarkable  . PSVT (paroxysmal supraventricular tachycardia) (HCC)     s/p adenocard 12 mg IVP 10/2011  . PE (pulmonary embolism)     Past Surgical History  Procedure Laterality Date  . Wrist surgery      cyst removal   . Leg surgery      cyst removal  . Coronary angiogram N/A 10/12/2011    Procedure: CORONARY ANGIOGRAM;  Surgeon: Herby Abraham, MD;  Location: [facility] CATH LAB;  Service: Cardiovascular;  Laterality: N/A;  . Percutaneous stent intervention N/A 10/12/2011    Procedure: PERCUTANEOUS STENT INTERVENTION;  Surgeon: Herby Abraham, MD;  Location: [facility] CATH LAB;  Service: Cardiovascular;  Laterality: N/A;    Current Outpatient Prescriptions  Medication Sig Dispense Refill  . acetaminophen (TYLENOL) 325 MG tablet Take 650 mg by mouth every 6 (six) hours as needed for mild pain.    . carvedilol (COREG) 12.5 MG tablet Take 12.5 mg by mouth daily.    [name] ELIQUIS 5 MG TABS tablet Take 1 tablet by mouth  2 (two) times daily.    [name] losartan (COZAAR) 100 MG tablet TAKE 1 TABLET BY MOUTH EVERY DAY 30 tablet 11  . nitroGLYCERIN (NITROSTAT) 0.4 MG SL tablet Place 0.4 mg under the tongue every 5 (five) minutes as needed for chest pain.     . pravastatin (PRAVACHOL) 80 MG tablet Take 1 tablet (80 mg total) by mouth daily. 30 tablet 0  . sertraline (ZOLOFT) 50 MG tablet Take 0.5 tablets (25 mg total) by mouth daily.     No current facility-administered medications for this visit.    Allergies  Allergen Reactions  . Lisinopril Cough    Social History   Social History  . Marital Status: Single    Spouse Name: N/A  . Number of Children: N/A  . Years of Education: N/A   Occupational History  . Not on file.    Social History Main Topics  . Smoking status: Never Smoker   . Smokeless tobacco: Never Used  . Alcohol Use: 0.6 oz/week    1 Cans of beer per week     Comment: socially  . Drug Use: No  . Sexual Activity: Yes    Birth Control/ Protection: None   Other Topics Concern  . Not on file   Social History Narrative    Family History  Problem Relation Age of Onset  . Coronary artery disease Brother     Had MI in his 81 yo  . Hypertension Mother   . Hypertension Brother   . Hypertension Brother     Review of Systems:  As stated in the HPI and otherwise negative.   BP 100/70 mmHg  Pulse 68  Ht  (1.702 m)  Wt 182 lb 6.4 oz (82.736 kg)  BMI 28.56 kg/m2  SpO2 97%  Physical Examination: General: Well developed, well nourished, NAD HEENT: OP clear, mucus membranes moist SKIN: warm, dry. No rashes. Neuro: No focal deficits Musculoskeletal: Muscle strength 5/5 all ext Psychiatric: Mood and affect normal Neck: No JVD, no carotid bruits, no thyromegaly, no lymphadenopathy. Lungs:Clear bilaterally, no wheezes, rhonci, crackles Cardiovascular: Regular rate and rhythm. No murmurs, gallops or rubs. Abdomen:Soft. Bowel sounds present. Non-tender.  Extremities: No lower extremity edema. Pulses are 2 + in the bilateral DP/PT.  Echo 04/05/15: Left ventricle: LVEF is approximately 40% with akinesis of the distal 1/4 of LV Large mural thrombus at apex (15 x 17 mm) The cavity size was normal. Wall thickness was normal. Doppler parameters are consistent with abnormal left ventricular relaxation (grade 1 diastolic dysfunction). - Mitral valve: There was mild regurgitation.  Cardiac cath 10/12/11: Left mainstem: The left main coronary artery was without critical narrowing. There was mild ostial narrowing of 20%.  Left anterior descending (LAD): The left anterior descending artery was totally occluded just beyond the origin of the second diagonal branch. The vessel proximal to  the site, including the first diagonal and septal, were free of significant disease. At the site of total occlusion, the vessel was dissected with TIMI 0 flow. Following wiring, and balloon dilatation, the vessel was opened demonstrating an infarction related dissection. Despite balloon dilatation, there was very poor distal runoff. A 2 mm x 28 mm non-drug-eluting mini vision stent was placed and post dilated. Following passage of the balloon distally, there was TIMI 2 flow into a small caliber apical vessel. This was most consistent with an edematous apex due to delayed presentation.  Left circumflex (LCx): The circumflex artery consisted of 2 major marginal branches, both of which  were free of significant disease.  Right coronary artery (RCA): The right coronary artery was nonselectively injected. It has an anterior takeoff from the left coronary cusp. It will require an AMPLATZ in the future to engage. It was well visualized and provided a posterior descending and posterolateral branch, both of which were free of disease.   Stress myoview 04/28/15: Nuclear Study Quality Overall image quality is good.     Nuclear Measurements Study was gated.    Rest Perfusion There is a defect present in the basal inferolateral, mid inferolateral and apex location.    Stress Perfusion There is a defect present in the basal inferolateral, mid inferolateral and apex location.    Perfusion Summary Defect 1:  There is a large defect of moderate severity present in the basal inferolateral, mid inferolateral and apex location. The defect is non-reversible.    Study Impression Myocardial perfusion is abnormal. Findings consistent with prior myocardial infarction. This is an intermediate risk study. Overall left ventricular systolic function was abnormal. LV cavity size is normal. The left ventricular ejection fraction is mildly decreased (45-54%). There is no prior study for comparison.       EKG:  EKG is  not ordered today.   Recent Labs: 04/02/2015: ALT 21 04/27/2015: B Natriuretic Peptide 14.4; TSH 0.615 04/28/2015: BUN 7; Creatinine, Ser 0.87; Hemoglobin 14.9; Platelets 241; Potassium 3.6; Sodium 136   Lipid Panel    Component Value Date/Time   CHOL 131 04/28/2015 0519   TRIG 39 04/28/2015 0519   HDL 41 04/28/2015 0519   CHOLHDL 3.2 04/28/2015 0519   VLDL 8 04/28/2015 0519   LDLCALC 82 04/28/2015 0519     Wt Readings from Last 3 Encounters:  10/30/15 182 lb 6.4 oz (82.736 kg)  05/02/15 170 lb 12.8 oz (77.474 kg)  04/27/15 167 lb 1.6 oz (75.796 kg)     Other studies Reviewed: Additional studies/ records that were reviewed today include: . Review of the above records demonstrates:    Assessment and Plan:   1. CAD: Stable. No ischemia on stress test 04/28/15. He has had no chest pain. No change in medications.  2. HLD: On a statin. Lipids followed in primary care  3. HTN: BP is controlled. No changes.    4. Non-sustained VT: Continue beta blocker. Asymptomatic.   5. PE/possible LV thrombus: on Eliquis per primary care. I would continue anti-coagulation given his LV apical akinesis.   Current medicines are reviewed at length with the patient today.  The patient does not have concerns regarding medicines.  The following changes have been made:  no change  Labs/ tests ordered today include:   No orders of the defined types were placed in this encounter.    Disposition:   FU with me in 12 months  Signed, Verne Carrow, MD 10/31/2015 7:29 AM    [facility] [address] [place], [place], [place]  [address] Phone: [phone]; Fax: [phone]

## 2015-10-30 NOTE — Patient Instructions (Signed)

## 2016-06-14 IMAGING — MR MR HEAD WO/W CM
10 of 12 series · 35 of 48 positions shown · IV contrast (multihance)
Comparison: None.

CLINICAL DATA: Gait instability.  Fatigue.  Short of breath.

EXAM:
MRI HEAD WITHOUT AND WITH CONTRAST
TECHNIQUE: Multiplanar, multiecho pulse sequences of the brain and surrounding
structures were obtained without and with intravenous contrast.
CONTRAST:  15mL MULTIHANCE GADOBENATE DIMEGLUMINE 529 MG/ML IV SOLN

[Series 3: DWI · axial · 3.0mm · 1.09mm/px · z∈[-78,+55]mm · 9 of 92 slices shown (1 of 4)]
[im 1/92]
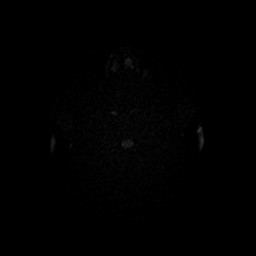
[im 12/92]
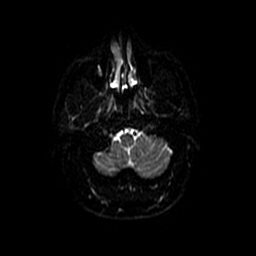
[im 23/92]
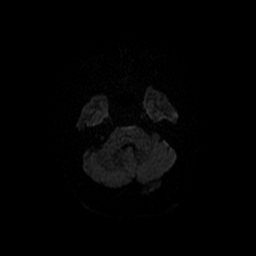
[im 35/92]
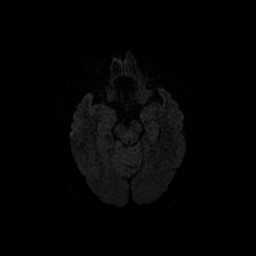
[im 46/92]
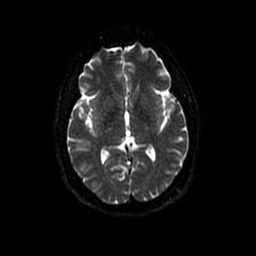
[im 57/92]
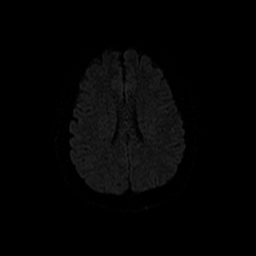
[im 69/92]
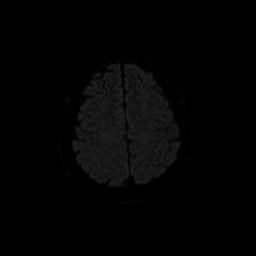
[im 80/92]
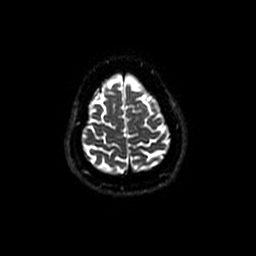
[im 92/92]
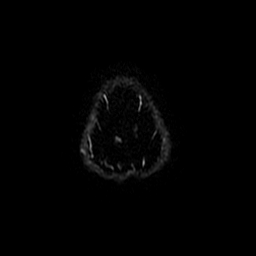

[Series 4: DWI · coronal · 5.0mm · 1.09mm/px · 7 of 74 slices shown (2 of 4)]
[im 1/74]
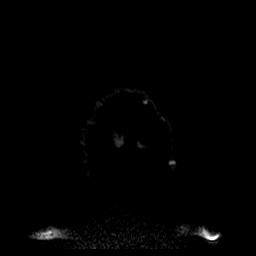
[im 13/74]
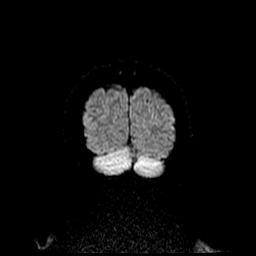
[im 25/74]
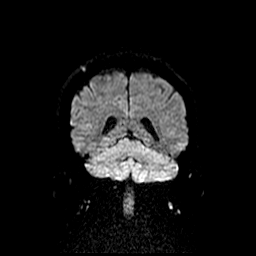
[im 37/74]
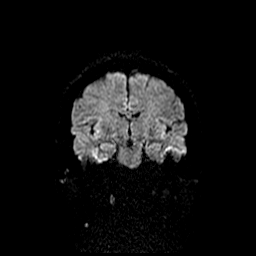
[im 49/74]
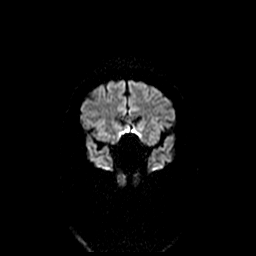
[im 61/74]
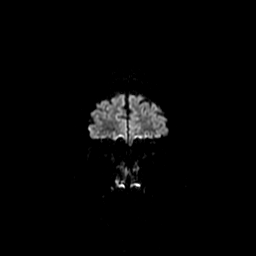
[im 74/74]
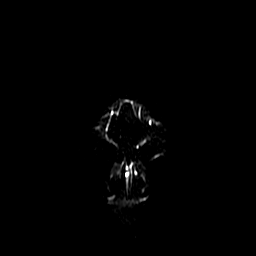

[Series 5: T1 · sagittal · 5.0mm · 0.47mm/px · 2 of 23 slices shown]
[im 1/23]
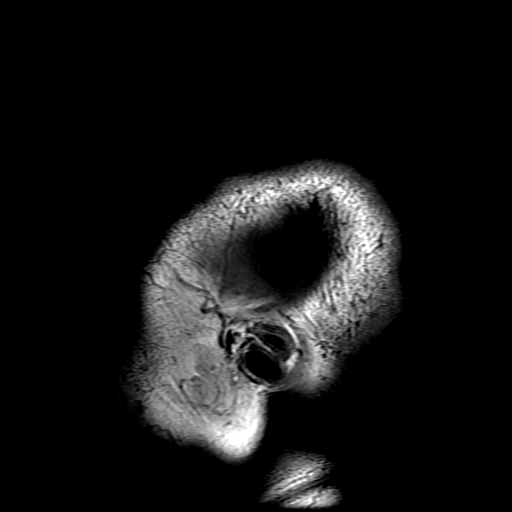
[im 23/23]
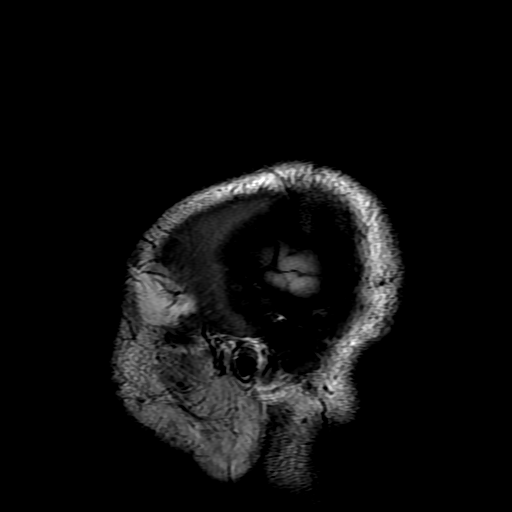

[Series 6: T2 · axial · 5.0mm · 0.43mm/px · z∈[-83,+53]mm · 2 of 24 slices shown]
[im 1/24]
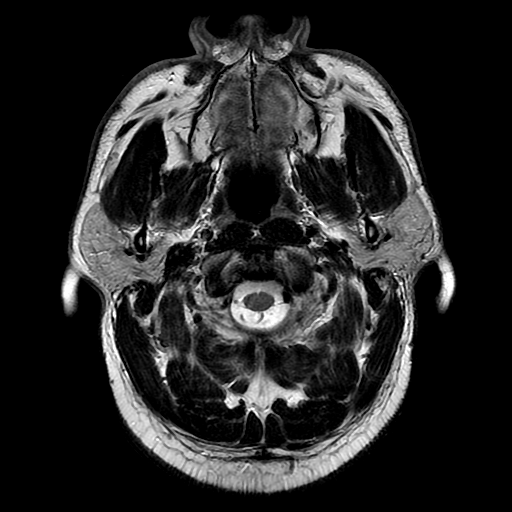
[im 24/24]
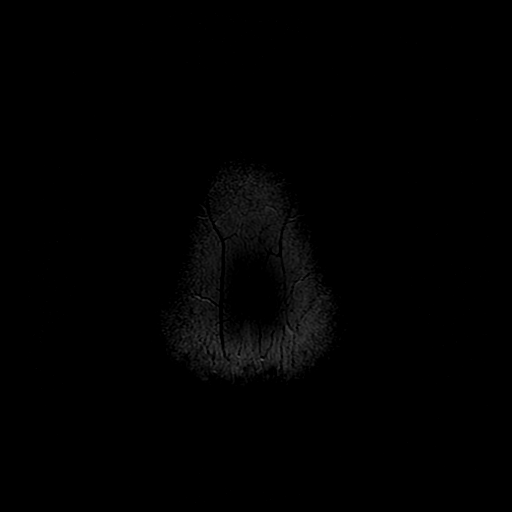

[Series 7: FLAIR · axial · 5.0mm · 0.43mm/px · z∈[-83,+53]mm · 2 of 24 slices shown]
[im 1/24]
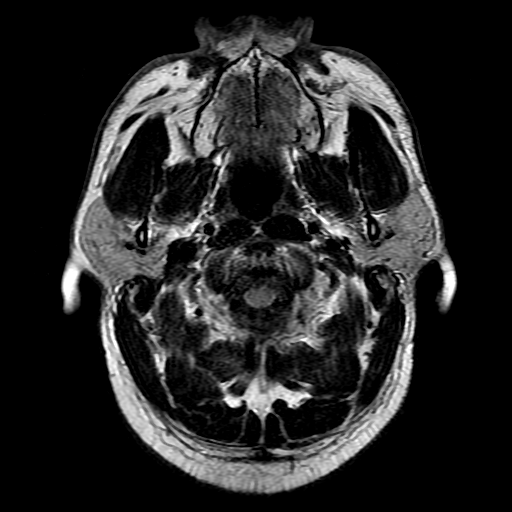
[im 24/24]
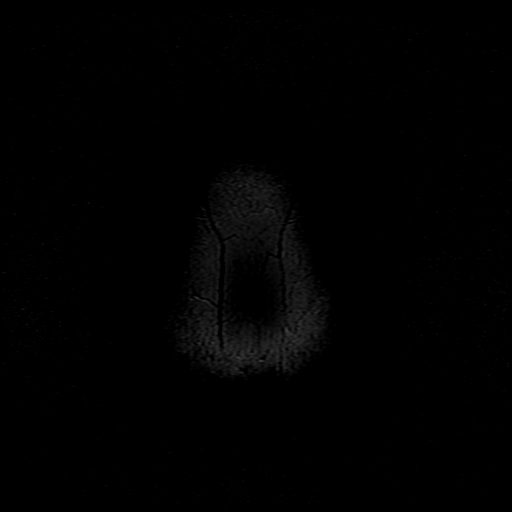

[Series 8: ax mpgr · axial · 5.0mm · 0.43mm/px · z∈[-83,+53]mm · 2 of 24 slices shown]
[im 1/24]
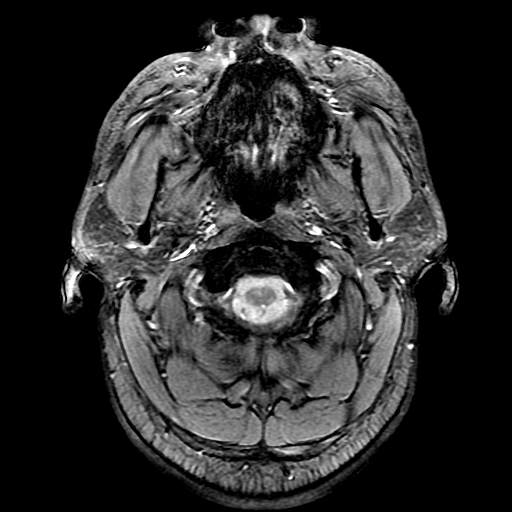
[im 24/24]
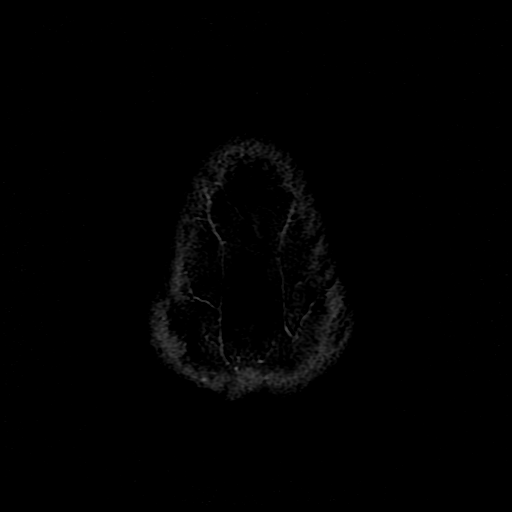

[Series 10: T2 post-contrast · coronal · 5.0mm · 0.43mm/px · 2 of 28 slices shown]
[im 1/28]
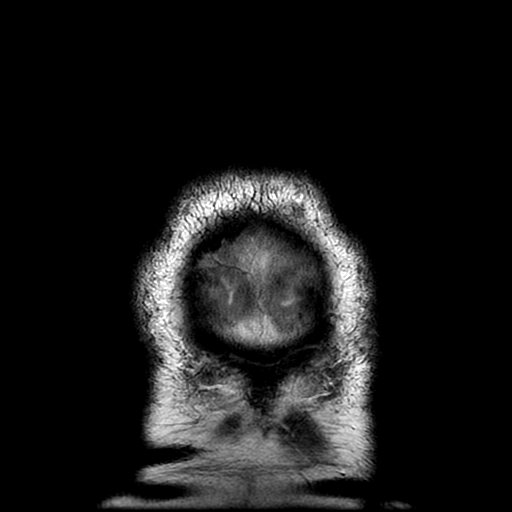
[im 28/28]
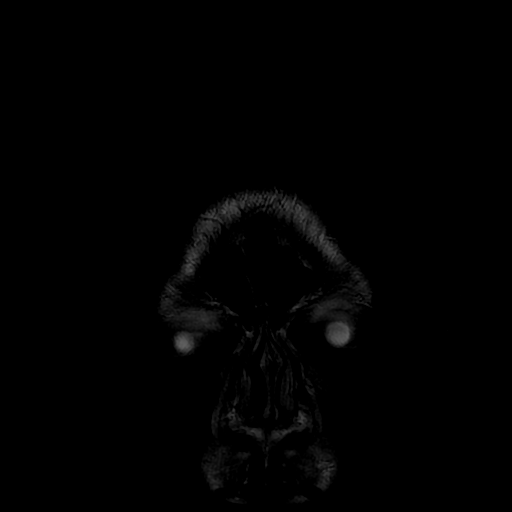

[Series 12: T1 post-contrast · coronal · 5.0mm · 0.43mm/px · 2 of 28 slices shown]
[im 1/28]
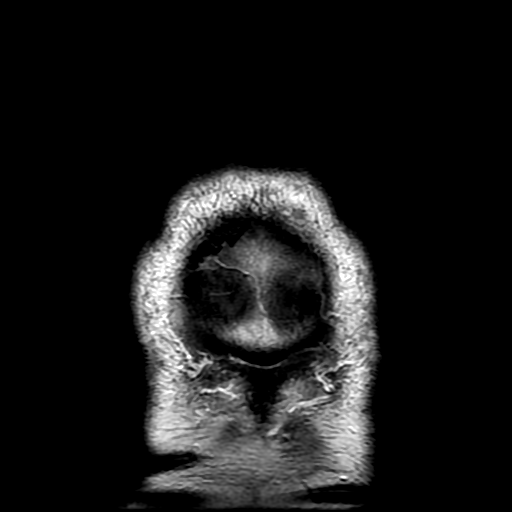
[im 28/28]
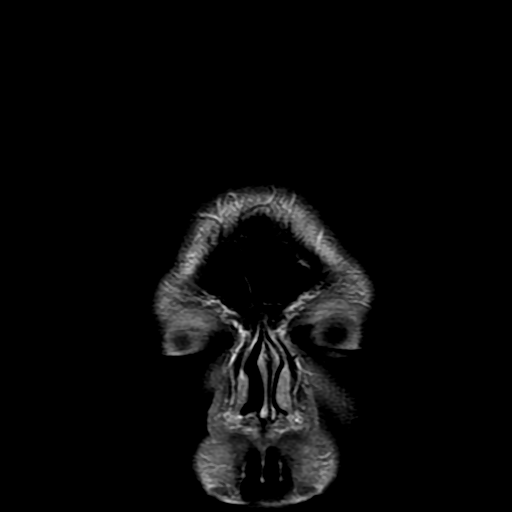

[Series 300: DWI · axial · 3.0mm · 1.09mm/px · z∈[-78,+55]mm · 4 of 46 slices shown (3 of 4)]
[im 1/46]
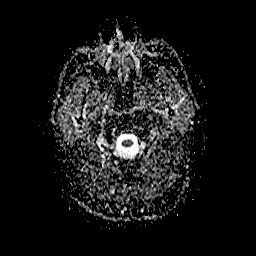
[im 16/46]
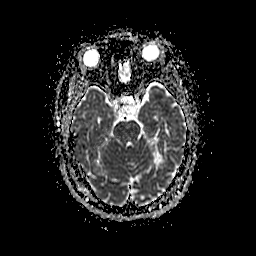
[im 31/46]
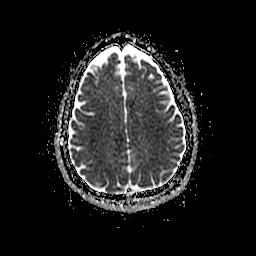
[im 46/46]
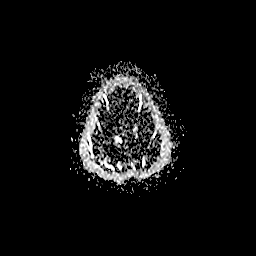

[Series 400: DWI · coronal · 5.0mm · 1.09mm/px · 3 of 37 slices shown (4 of 4)]
[im 1/37]
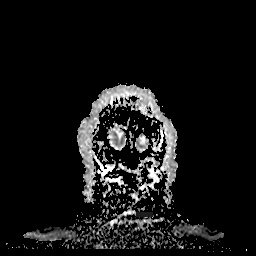
[im 19/37]
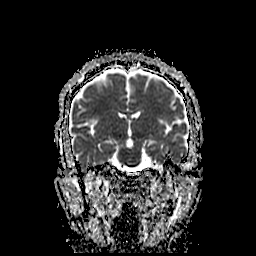
[im 37/37]
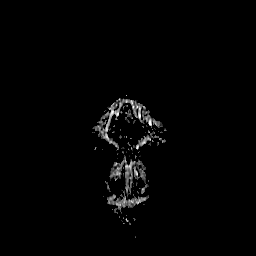

[35 of 48 positions shown; findings below may reference images not displayed]

FINDINGS: Ventricle size is normal. Cerebral volume is normal. Pituitary
normal in size. Craniocervical junction normal. Negative for Chiari
malformation.

Mild mucosal edema in the paranasal sinuses without air-fluid level.
Calvarium intact. Negative orbit.

Negative for acute or chronic infarction

Negative for demyelinating disease. Cerebral white matter normal.
Brainstem and cerebellum normal.

Negative for intracranial hemorrhage or fluid collection

Negative for mass or edema

Normal enhancement following contrast infusion. No enhancing mass
lesion. Leptomeningeal enhancement normal.
IMPRESSION: Normal MRI of the brain with contrast

Mild mucosal edema in the paranasal sinuses.

## 2016-07-09 IMAGING — CR DG CHEST 2V
2 series · 2 of 2 positions shown · non-contrast
Comparison: 04/02/2015

CLINICAL DATA: Chest pain.

EXAM:
CHEST  2 VIEW

[w chest pa]
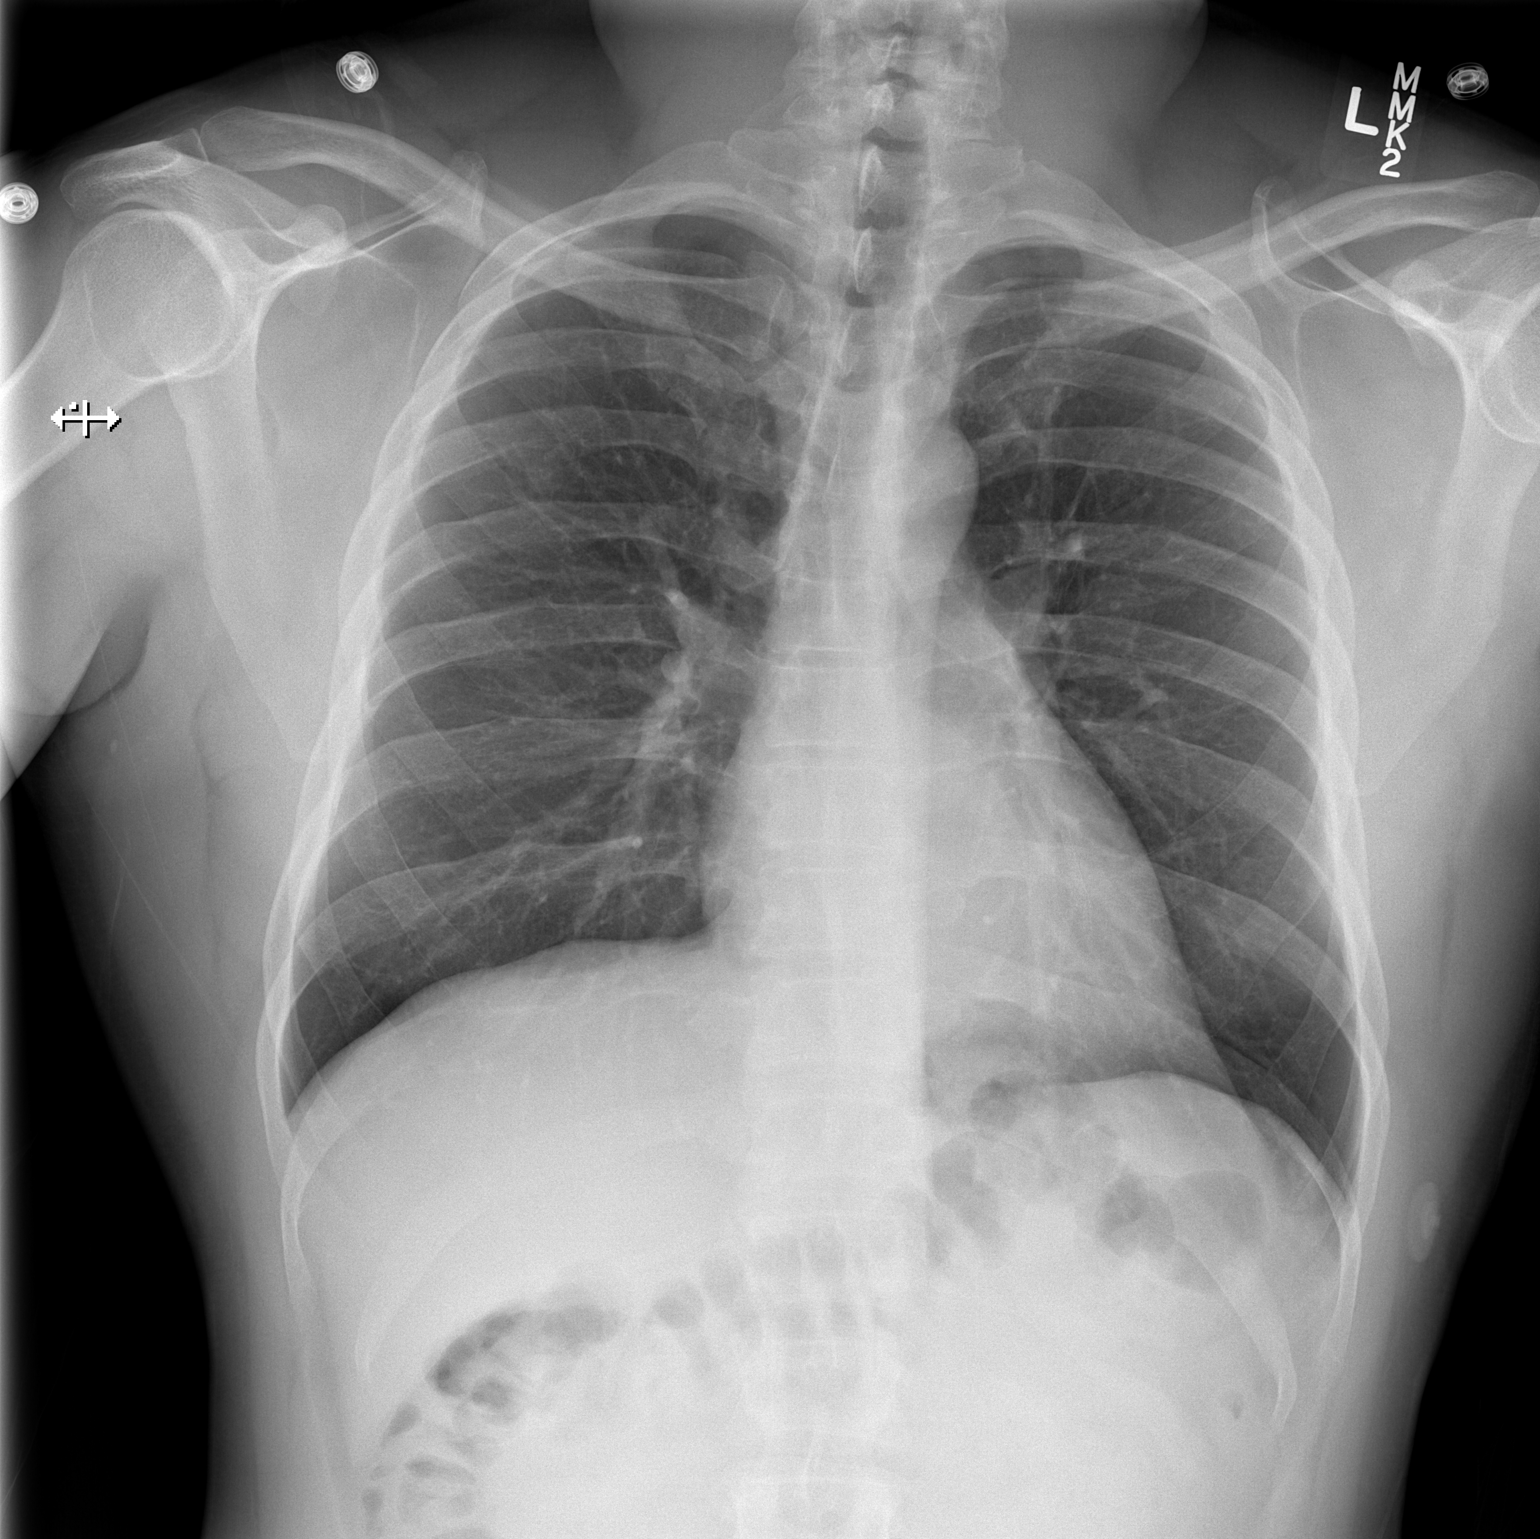

[w chest lat]
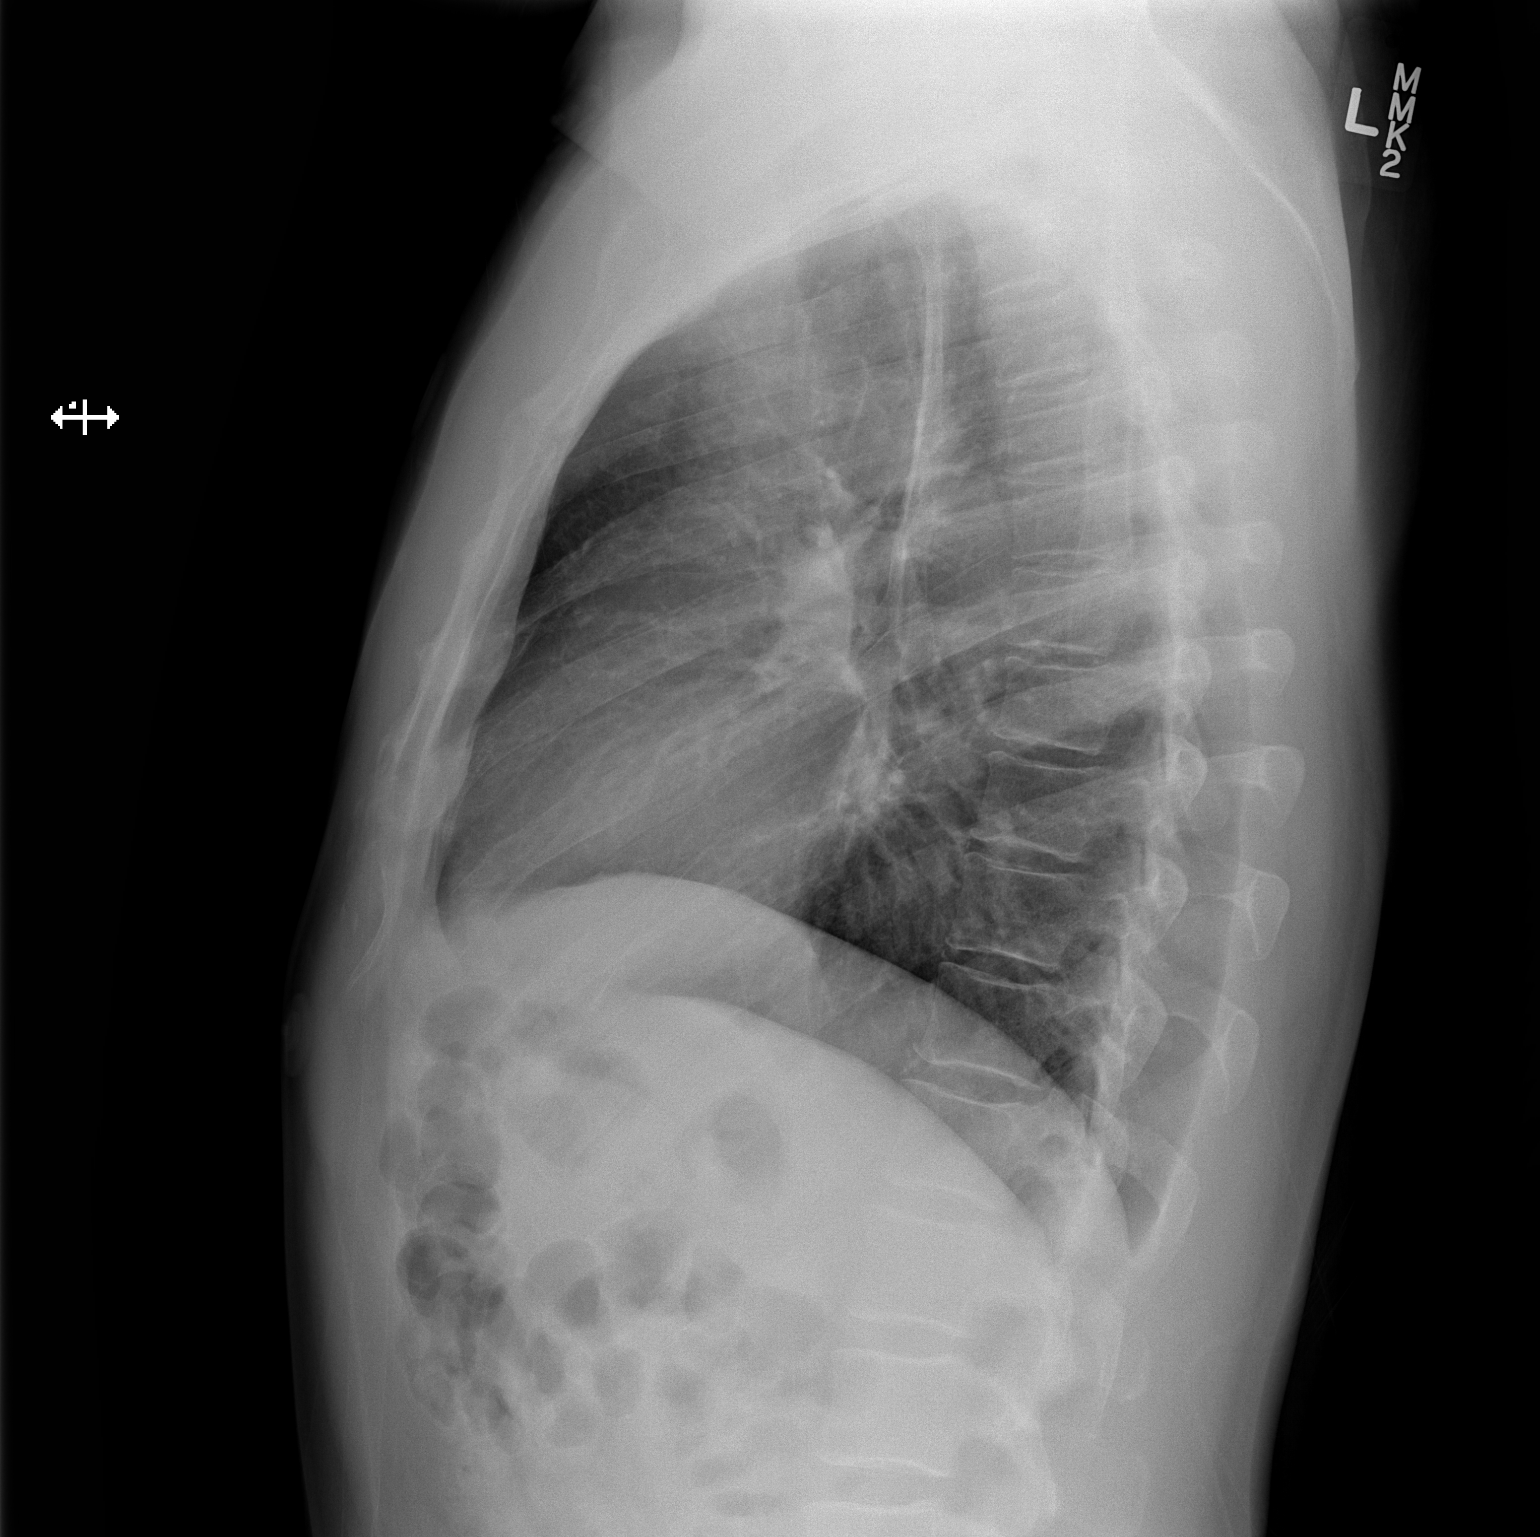

[2 of 2 positions shown; findings below may reference images not displayed]

FINDINGS: The heart size and mediastinal contours are within normal limits.
Both lungs are clear. The visualized skeletal structures are
unremarkable.
IMPRESSION: Normal chest.

## 2016-10-29 ENCOUNTER — Encounter: Payer: Self-pay | Admitting: Physician Assistant

## 2016-10-29 DIAGNOSIS — I5042 Chronic combined systolic (congestive) and diastolic (congestive) heart failure: Secondary | ICD-10-CM | POA: Insufficient documentation

## 2016-10-29 NOTE — Progress Notes (Signed)
Cardiology Office Note    Date:  10/30/2016  ID:  JALMER TOOKER, DOB 09-02-1966, MRN [id number] PCP:  Georgann Housekeeper, MD  Cardiologist:  Dr. [name]   Chief Complaint: routine f/u CAD  History of Present Illness:  Rodney Marquez is a 50 y.o. male with history of CAD (anterior STEMI 09/2011 s/p BMS to LAD), LV apical thrombus, PE, HTN, HLD (h/o elevated LFTs), SVT, NSVT, PVCs, PACs, ischemic cardioymopathy, chronic combined CHF who presents for follow-up. He has been followed in the past by Dr. Riley Kill. He had an anterior STEMI 10/12/11 and found to have occluded LAD. He had a bare metal stent placed in the LAD. He was treated with Coumadin following d/c for LV apical thrombus but went off as of 2013. He was seen in the [facility] ED with weakness 04/02/15 and had negative head MRI. CT angio of the chest at that time showed very small amount of nonocclusive distal segmental/proximal subsegmental left lower lobe embolus as well as myocardial thinning of the left ventricular apex consistent with prior infarction; Adjacent filling defect may reflect thrombus in the left ventricular apex. 2D echo 03/2015 subsequently showed EF 40% aith akinesis of the distal 1/4 of LV, large mural thrombus at apex, grade 1 DD, mild MR. He was placed back on Eliquis. He was feeling poorly in follow-up thus stress test and event monitor were ordered. He did not complete these tests but in the interim was admitted to [facility] 04/27/15 with fatigue and had inpatient stress myoview with no ischemia; findings c/w prior MI, EF 47%. Metoprolol dose was reduced to 25 mg twice daily. 48 hour monitor June 2016 showed 12 beat run NSVT, rare PVCs, rare PACs. Lopressor dose was then increased again. He has since been taking carvedilol. His med list says once daily but he says he's taking this BID. Last labs 04/2015: BMET OK except glucose 140, CBC wnl, LDL 82.  He presents back for usual follow-up. Overall he is doing "alright." Not really physically  active. He has not had any chest pain, SOB, orthopnea, edema, palpitations or syncope. He says he did not recall that he had recurrent clot in his heart on 2016 ultrasound. He has not had any bleeding on the blood thinner. His BP is running higher today. He states it's been running higher recently. Reports compliance with meds. Does not add salt to meals but does not really watch sodium intake either.   Past Medical History:  Diagnosis Date  . Cardiomyopathy, ischemic 09/2011  . Chronic combined systolic and diastolic CHF (congestive heart failure) (HCC)   . Coronary artery disease    late presentation with ant STEMI 11/12: LHC 10/12/11: oLM 20%, LAD occluded with dissection 2/2 MI.  PCI with BMS to LAD with poor distal runoff; small caliber apical vessel c/w edematous apex from delayed presentation.  . Elevated LFTs    Abdominal ultrasound was unremarkable  . Family history of early CAD    Brother had MI at age 74 yo.  [name] HLD (hyperlipidemia)   . HTN (hypertension)   . 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 NSVT (nonsustained ventricular tachycardia) (HCC)   . PE (pulmonary embolism) 03/2015   a. CTA 03/2015: very small amount of nonocclusive distal segmental/proximal subsegmental left lower lobe embolus as well as myocardial thinning of the left ventricular apex consistent with prior infarction; Adjacent filling defect may reflect thrombus in the left ventricular apex.   . Premature atrial contractions   .  PSVT (paroxysmal supraventricular tachycardia) (HCC)    s/p adenocard 12 mg IVP 10/2011  . PVC's (premature ventricular contractions)   . ST elevation myocardial infarction (STEMI) of anterior wall (HCC) 09/2011   a. 09/2011 s/p BMS to occluded LAD.    Past Surgical History:  Procedure Laterality Date  . CORONARY ANGIOGRAM N/A 10/12/2011   Procedure: CORONARY ANGIOGRAM;  Surgeon: Herby Abraham, MD;  Location: [facility] CATH LAB;  Service: Cardiovascular;   Laterality: N/A;  . LEG SURGERY     cyst removal  . PERCUTANEOUS STENT INTERVENTION N/A 10/12/2011   Procedure: PERCUTANEOUS STENT INTERVENTION;  Surgeon: Herby Abraham, MD;  Location: [facility] CATH LAB;  Service: Cardiovascular;  Laterality: N/A;  . WRIST SURGERY     cyst removal     Current Medications: Current Outpatient Prescriptions  Medication Sig Dispense Refill  . acetaminophen (TYLENOL) 325 MG tablet Take 650 mg by mouth every 6 (six) hours as needed for mild pain.    . carvedilol (COREG) 12.5 MG tablet Take 12.5 mg by mouth daily.    [name] ELIQUIS 5 MG TABS tablet Take 1 tablet by mouth 2 (two) times daily.    [name] losartan (COZAAR) 100 MG tablet TAKE 1 TABLET BY MOUTH EVERY DAY 30 tablet 11  . pravastatin (PRAVACHOL) 80 MG tablet Take 1 tablet (80 mg total) by mouth daily. 30 tablet 0  . sertraline (ZOLOFT) 50 MG tablet Take 0.5 tablets (25 mg total) by mouth daily.    . nitroGLYCERIN (NITROSTAT) 0.4 MG SL tablet Place 0.4 mg under the tongue every 5 (five) minutes as needed for chest pain.      No current facility-administered medications for this visit.      Allergies:   Lisinopril   Social History   Social History  . Marital status: Single    Spouse name: N/A  . Number of children: 0  . Years of education: N/A   Social History Main Topics  . Smoking status: Never Smoker  . Smokeless tobacco: Never Used  . Alcohol use 0.6 oz/week    1 Cans of beer per week     Comment: socially  . Drug use: No  . Sexual activity: Yes    Birth control/ protection: None   Other Topics Concern  . None   Social History Narrative  . None     Family History:  The patient's family history includes Coronary artery disease in his brother; Hypertension in his brother, brother, and mother.   ROS:   Please see the history of present illness.  All other systems are reviewed and otherwise negative.    PHYSICAL EXAM:   VS:  BP (!) 146/96   Pulse 63   Ht 5\' 7"  (1.702 m)   Wt 185 lb 12  oz (84.3 kg)   BMI 29.09 kg/m   BMI: Body mass index is 29.09 kg/m. GEN: Well nourished, well developed AAM, in no acute distress  HEENT: normocephalic, atraumatic Neck: no JVD, carotid bruits, or masses Cardiac: RRR; no murmurs, rubs, or gallops, no edema  Respiratory:  clear to auscultation bilaterally, normal work of breathing GI: soft, nontender, nondistended, + BS MS: no deformity or atrophy  Skin: warm and dry, no rash Neuro:  Alert and Oriented x 3, Strength and sensation are intact, follows commands Psych: euthymic mood, full affect  Wt Readings from Last 3 Encounters:  10/30/16 185 lb 12 oz (84.3 kg)  10/30/15 182 lb 6.4 oz (82.7 kg)  05/02/15 170 lb  12.8 oz (77.5 kg)      Studies/Labs Reviewed:   EKG:  EKG was ordered today and personally reviewed by me and demonstrates NSR 63bpm, nonspecific ST-T changes included rounded ST-T I, avL, TWI V2-V6. Similar to prior.  Recent Labs: No results found for requested labs within last 8760 hours.   Lipid Panel    Component Value Date/Time   CHOL 131 04/28/2015 0519   TRIG 39 04/28/2015 0519   HDL 41 04/28/2015 0519   CHOLHDL 3.2 04/28/2015 0519   VLDL 8 04/28/2015 0519   LDLCALC 82 04/28/2015 0519    Additional studies/ records that were reviewed today include: Summarized above.   ASSESSMENT & PLAN:   1. CAD - no ischemia on stress test in 2016. No recent anginal symptoms. Not on ASA due to concomitant Eliquis. Continue beta blocker and statin. See below re: statin. Encouraged regular physical activity. 2. LV thrombus - I reviewed with Dr. Clifton JamesMcAlhany regarding Eliquis as there is currently no data for use in patients with LV thrombus, but the patient is on it because he had a PE as well. Will update echocardiogram with [facility]. The presumption is that Eliquis would have the same end-point anticoagulation effect as Coumadin, but would reconsider going back to Coumadin if there is still evidence of LV thrombus present. Will  also update CBC given chronic blood thinner use.  3. Essential HTN - blood pressure elevated in clinic today. Cannot increase beta blocker further due to h/o sinus bradycardia (50s-60s) and losartan is at max dose. Most recent EF 40% by echo 2016. Will check labs today to help guide selection of medication titration. 4. Chronic combined CHF/ICM - appears euvolemic. Reviewed low sodium diet with patient. Will f/u EF by ultrasound and await labs to help guide med titration.  5. Hyperlipidemia - he has history of elevated LFTs in the past. He denies previously having any issues with other statins, has been on pravastatin long-term. Will check lipids and LFTs today. If stable would consider titrating pravastatin to more potent statin and following liver function.  Disposition: F/u with me or Dr. Clifton JamesMcAlhany after echocardiogram.    Medication Adjustments/Labs and Tests Ordered: Current medicines are reviewed at length with the patient today.  Concerns regarding medicines are outlined above. Medication changes, Labs and Tests ordered today are summarized above and listed in the Patient Instructions accessible in Encounters.   Thomasene MohairSigned, Dayna Dunn PA-C  10/30/2016 10:03 AM    [facility] [address], [place], [place]  [address] Phone: [phone]; Fax: [phone]

## 2016-10-30 ENCOUNTER — Encounter: Payer: Self-pay | Admitting: Physician Assistant

## 2016-10-30 ENCOUNTER — Other Ambulatory Visit: Payer: Self-pay | Admitting: Physician Assistant

## 2016-10-30 ENCOUNTER — Ambulatory Visit (INDEPENDENT_AMBULATORY_CARE_PROVIDER_SITE_OTHER): Payer: BLUE CROSS/BLUE SHIELD | Admitting: Physician Assistant

## 2016-10-30 VITALS — BP 146/96 | HR 63 | Ht 67.0 in | Wt 185.8 lb

## 2016-10-30 DIAGNOSIS — Z9861 Coronary angioplasty status: Secondary | ICD-10-CM

## 2016-10-30 DIAGNOSIS — I255 Ischemic cardiomyopathy: Secondary | ICD-10-CM

## 2016-10-30 DIAGNOSIS — I5042 Chronic combined systolic (congestive) and diastolic (congestive) heart failure: Secondary | ICD-10-CM | POA: Diagnosis not present

## 2016-10-30 DIAGNOSIS — I2510 Atherosclerotic heart disease of native coronary artery without angina pectoris: Secondary | ICD-10-CM | POA: Diagnosis not present

## 2016-10-30 DIAGNOSIS — I236 Thrombosis of atrium, auricular appendage, and ventricle as current complications following acute myocardial infarction: Secondary | ICD-10-CM

## 2016-10-30 DIAGNOSIS — I2129 ST elevation (STEMI) myocardial infarction involving other sites: Secondary | ICD-10-CM

## 2016-10-30 DIAGNOSIS — E785 Hyperlipidemia, unspecified: Secondary | ICD-10-CM

## 2016-10-30 DIAGNOSIS — I10 Essential (primary) hypertension: Secondary | ICD-10-CM | POA: Diagnosis not present

## 2016-10-30 LAB — CBC
HEMATOCRIT: 41.2 % (ref 38.5–50.0)
Hemoglobin: 14 g/dL (ref 13.2–17.1)
MCH: 31.9 pg (ref 27.0–33.0)
MCHC: 34 g/dL (ref 32.0–36.0)
MCV: 93.8 fL (ref 80.0–100.0)
MPV: 10.1 fL (ref 7.5–12.5)
PLATELETS: 244 10*3/uL (ref 140–400)
RBC: 4.39 MIL/uL (ref 4.20–5.80)
RDW: 14.4 % (ref 11.0–15.0)
WBC: 5.3 10*3/uL (ref 3.8–10.8)

## 2016-10-30 LAB — LIPID PANEL
CHOL/HDL RATIO: 4.1 ratio (ref <5.0)
Cholesterol: 147 mg/dL (ref <200)
HDL: 36 mg/dL — AB (ref >40)
LDL CALC: 91 mg/dL (ref <100)
TRIGLYCERIDES: 100 mg/dL (ref <150)
VLDL: 20 mg/dL (ref <30)

## 2016-10-30 LAB — TSH: TSH: 1.34 m[IU]/L (ref 0.40–4.50)

## 2016-10-30 MED ORDER — NITROGLYCERIN 0.4 MG SL SUBL: 0.4000 mg | SUBLINGUAL_TABLET | SUBLINGUAL | 3 refills | Status: DC | PRN

## 2016-10-30 MED ORDER — CARVEDILOL 12.5 MG PO TABS: 12.5000 mg | ORAL_TABLET | Freq: Two times a day (BID) | ORAL | 3 refills | Status: AC

## 2016-10-30 NOTE — Patient Instructions (Signed)
Medication Instructions:  Your physician recommends that you continue on your current medications as directed. Please refer to the Current Medication list given to you today.   Labwork: TODAY:  CMET (STAT), CBC, TSH, & LIPID  Testing/Procedures: Your physician has requested that you have an echocardiogram. Echocardiography is a painless test that uses sound waves to create images of your heart. It provides your doctor with information about the size and shape of your heart and how well your heart's chambers and valves are working. This procedure takes approximately one hour. There are no restrictions for this procedure.   Follow-Up: Your physician recommends that you schedule a follow-up appointment in: AFTER ECHOCARDIOGRAM WITH DR. [name] OR DAYNA DUNN, PA-C.    Any Other Special Instructions Will Be Listed Below (If Applicable). Echocardiogram An echocardiogram, or echocardiography, uses sound waves (ultrasound) to produce an image of your heart. The echocardiogram is simple, painless, obtained within a short period of time, and offers valuable information to your health care provider. The images from an echocardiogram can provide information such as:  Evidence of coronary artery disease (CAD).  Heart size.  Heart muscle function.  Heart valve function.  Aneurysm detection.  Evidence of a past heart attack.  Fluid buildup around the heart.  Heart muscle thickening.  Assess heart valve function. Tell a health care provider about:  Any allergies you have.  All medicines you are taking, including vitamins, herbs, eye drops, creams, and over-the-counter medicines.  Any problems you or family members have had with anesthetic medicines.  Any blood disorders you have.  Any surgeries you have had.  Any medical conditions you have.  Whether you are pregnant or may be pregnant. What happens before the procedure? No special preparation is needed. Eat and drink  normally. What happens during the procedure?  In order to produce an image of your heart, gel will be applied to your chest and a wand-like tool (transducer) will be moved over your chest. The gel will help transmit the sound waves from the transducer. The sound waves will harmlessly bounce off your heart to allow the heart images to be captured in real-time motion. These images will then be recorded.  You may need an IV to receive a medicine that improves the quality of the pictures. What happens after the procedure? You may return to your normal schedule including diet, activities, and medicines, unless your health care provider tells you otherwise. This information is not intended to replace advice given to you by your health care provider. Make sure you discuss any questions you have with your health care provider. Document Released: 11/06/2000 Document Revised: 06/27/2016 Document Reviewed: 07/17/2013 Elsevier Interactive Patient Education  2017 [employer].    If you need a refill on your cardiac medications before your next appointment, please call your pharmacy.

## 2016-11-02 ENCOUNTER — Telehealth: Payer: Self-pay | Admitting: *Deleted

## 2016-11-02 DIAGNOSIS — Z79899 Other long term (current) drug therapy: Secondary | ICD-10-CM

## 2016-11-02 LAB — COMPREHENSIVE METABOLIC PANEL
ALK PHOS: 57 U/L (ref 40–115)
ALT: 20 U/L (ref 9–46)
AST: 23 U/L (ref 10–40)
Albumin: 4.2 g/dL (ref 3.6–5.1)
BUN: 14 mg/dL (ref 7–25)
CO2: 23 mmol/L (ref 20–31)
CREATININE: 1.11 mg/dL (ref 0.60–1.35)
Calcium: 8.9 mg/dL (ref 8.6–10.3)
Chloride: 106 mmol/L (ref 98–110)
GLUCOSE: 88 mg/dL (ref 65–99)
Potassium: 4.3 mmol/L (ref 3.5–5.3)
Sodium: 140 mmol/L (ref 135–146)
Total Bilirubin: 0.8 mg/dL (ref 0.2–1.2)
Total Protein: 6.1 g/dL (ref 6.1–8.1)

## 2016-11-02 MED ORDER — SPIRONOLACTONE 25 MG PO TABS: 25.0000 mg | ORAL_TABLET | Freq: Every day | ORAL | 3 refills | Status: DC

## 2016-11-02 MED ORDER — ATORVASTATIN CALCIUM 40 MG PO TABS: 40.0000 mg | ORAL_TABLET | Freq: Every evening | ORAL | 3 refills | Status: DC

## 2016-11-02 NOTE — Telephone Encounter (Signed)
Pt aware of his lab results. He will d/c Pravastatin and start Atorvastatin 40 mg qd He will start the Spironolactone 25 mg qd and recheck bmet 11/09/16.  Orders in EPIC.

## 2016-11-09 ENCOUNTER — Other Ambulatory Visit: Payer: BLUE CROSS/BLUE SHIELD | Admitting: *Deleted

## 2016-11-09 DIAGNOSIS — Z79899 Other long term (current) drug therapy: Secondary | ICD-10-CM

## 2016-11-10 LAB — BASIC METABOLIC PANEL
BUN: 15 mg/dL (ref 7–25)
CALCIUM: 9.6 mg/dL (ref 8.6–10.3)
CO2: 29 mmol/L (ref 20–31)
Chloride: 104 mmol/L (ref 98–110)
Creat: 1.2 mg/dL (ref 0.60–1.35)
Glucose, Bld: 82 mg/dL (ref 65–99)
Potassium: 4.1 mmol/L (ref 3.5–5.3)
Sodium: 140 mmol/L (ref 135–146)

## 2016-11-20 ENCOUNTER — Ambulatory Visit (HOSPITAL_COMMUNITY): Payer: BLUE CROSS/BLUE SHIELD | Attending: Cardiovascular Disease

## 2016-11-20 ENCOUNTER — Other Ambulatory Visit: Payer: Self-pay

## 2016-11-20 DIAGNOSIS — I501 Left ventricular failure: Secondary | ICD-10-CM | POA: Insufficient documentation

## 2016-11-20 DIAGNOSIS — I236 Thrombosis of atrium, auricular appendage, and ventricle as current complications following acute myocardial infarction: Secondary | ICD-10-CM | POA: Diagnosis not present

## 2016-11-20 DIAGNOSIS — I2129 ST elevation (STEMI) myocardial infarction involving other sites: Secondary | ICD-10-CM | POA: Diagnosis not present

## 2016-11-20 MED ORDER — PERFLUTREN LIPID MICROSPHERE
1.0000 mL | INTRAVENOUS | Status: AC | PRN
  Administered 2016-11-20: 2 mL via INTRAVENOUS

## 2016-11-24 ENCOUNTER — Telehealth: Payer: Self-pay | Admitting: Physician Assistant

## 2016-11-24 NOTE — Telephone Encounter (Signed)
New Message    Returning call toJennifer

## 2016-11-24 NOTE — Telephone Encounter (Signed)
Returned pts call to discuss his 2D Echo results, left another message for pt to call back.

## 2016-11-25 ENCOUNTER — Encounter: Payer: Self-pay | Admitting: Physician Assistant

## 2016-11-25 ENCOUNTER — Telehealth: Payer: Self-pay | Admitting: Cardiovascular Disease

## 2016-11-25 NOTE — Telephone Encounter (Signed)
Do not need this encounter °

## 2016-11-25 NOTE — Telephone Encounter (Signed)
Follow Up:; ° ° °Returning your call. °

## 2016-11-25 NOTE — Progress Notes (Signed)
Cardiology Office Note    Date:  11/26/2016  ID:  Rodney Marquez, DOB [birth year], MRN [id number] PCP:  Georgann Housekeeper, MD  Cardiologist:  Dr. [name]   Chief Complaint: f/u CHF  History of Present Illness:  Rodney Marquez is a 51 y.o. male with history of CAD (anterior STEMI 09/2011 s/p BMS to LAD), LV apical thrombus, PE, HTN, HLD (h/o elevated LFTs), SVT, NSVT, PVCs, PACs, ischemic cardioymopathy, chronic combined CHF, h/o elevated LFTs who presents for follow-up. He has been followed in the past by Dr. Riley Kill. He had an anterior STEMI 10/12/11 and found to have occluded LAD. He had a bare metal stent placed in the LAD. He was treated with Coumadin following d/c for LV apical thrombus but went off as of 2013. He was seen in the [facility] ED with weakness 04/02/15 and had negative head MRI. CT angio of the chest at that time showed very small amount of nonocclusive distal segmental/proximal subsegmental left lower lobe embolus as well as myocardial thinning of the left ventricular apex consistent with prior infarction; Adjacent filling defect may reflect thrombus in the left ventricular apex. 2D echo 03/2015 subsequently showed EF 40% aith akinesis of the distal 1/4 of LV, large mural thrombus at apex, grade 1 DD, mild MR. He was placed back on Eliquis. He was feeling poorly in follow-up thus stress test and event monitor were ordered. He did not complete these tests but in the interim was admitted to [facility] 04/27/15 with fatigue and had inpatient stress myoview with no ischemia; findings c/w prior MI, EF 47%. Metoprolol dose was reduced to 25 mg twice daily. 48 hour monitor was eventually performed June 2016 showing 12 beat run NSVT, rare PVCs, rare PACs. Lopressor dose was then increased again. He has since been taking carvedilol. At f/u visit on 10/30/16, his blood pressure was running higher. Labs showed normal CMET (Cr 1.11), CBC, TSH; LDL 91. Pravastatin was discontinued and changed to atorvastatin.  Spironolactone was added with stable BMET in f/u. 2D echo 11/20/16: EF 45-50%, dyskinesis of apical myocardium, grade 1 DD. no evidence of thrombus.  He presents to clinic today to review the above tests. He is feeling well without complaint. No CP, SOB, LEE, orthopnea, PND, weight gain. He is tolerating the new meds (atorva and spiro) without complaint. He is noticing that his blood pressure still tends to run high at times - in particular, seeing diastolics in the 95+ range. BP recheck 135/95.   Past Medical History:  Diagnosis Date  . Cardiomyopathy, ischemic 09/2011  . Chronic combined systolic and diastolic CHF (congestive heart failure) (HCC)   . Coronary artery disease    late presentation with ant STEMI 11/12: LHC 10/12/11: oLM 20%, LAD occluded with dissection 2/2 MI.  PCI with BMS to LAD with poor distal runoff; small caliber apical vessel c/w edematous apex from delayed presentation.  . Elevated LFTs    Abdominal ultrasound was unremarkable  . Family history of early CAD    Brother had MI at age 76 yo.  [name] HLD (hyperlipidemia)   . HTN (hypertension)   . 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 After STEMI 2012, also noted on CT in 2016. b. 2D echo 2017: no thrombus noted.  [name] NSVT (nonsustained ventricular tachycardia) (HCC)   . PE (pulmonary embolism) 03/2015   a. CTA 03/2015: very small amount of nonocclusive distal segmental/proximal subsegmental left lower lobe embolus as well as myocardial thinning of the  left ventricular apex consistent with prior infarction; Adjacent filling defect may reflect thrombus in the left ventricular apex.   . Premature atrial contractions   . PSVT (paroxysmal supraventricular tachycardia) (HCC)    s/p adenocard 12 mg IVP 10/2011  . PVC's (premature ventricular contractions)   . ST elevation myocardial infarction (STEMI) of anterior wall (HCC) 09/2011   a. 09/2011 s/p BMS to occluded LAD.    Past Surgical History:    Procedure Laterality Date  . CORONARY ANGIOGRAM N/A 10/12/2011   Procedure: CORONARY ANGIOGRAM;  Surgeon: Herby Abraham, MD;  Location: [facility] CATH LAB;  Service: Cardiovascular;  Laterality: N/A;  . LEG SURGERY     cyst removal  . PERCUTANEOUS STENT INTERVENTION N/A 10/12/2011   Procedure: PERCUTANEOUS STENT INTERVENTION;  Surgeon: Herby Abraham, MD;  Location: [facility] CATH LAB;  Service: Cardiovascular;  Laterality: N/A;  . WRIST SURGERY     cyst removal     Current Medications: Current Outpatient Prescriptions  Medication Sig Dispense Refill  . acetaminophen (TYLENOL) 325 MG tablet Take 650 mg by mouth every 6 (six) hours as needed for mild pain.    [name] atorvastatin (LIPITOR) 40 MG tablet Take 1 tablet (40 mg total) by mouth every evening. 90 tablet 3  . carvedilol (COREG) 12.5 MG tablet Take 1 tablet (12.5 mg total) by mouth 2 (two) times daily with a meal. 180 tablet 3  . ELIQUIS 5 MG TABS tablet Take 1 tablet by mouth 2 (two) times daily.    [name] losartan (COZAAR) 100 MG tablet TAKE 1 TABLET BY MOUTH EVERY DAY 30 tablet 11  . nitroGLYCERIN (NITROSTAT) 0.4 MG SL tablet Place 1 tablet (0.4 mg total) under the tongue every 5 (five) minutes as needed for chest pain. 25 tablet 3  . sertraline (ZOLOFT) 50 MG tablet Take 0.5 tablets (25 mg total) by mouth daily.    [name] spironolactone (ALDACTONE) 25 MG tablet Take 1 tablet (25 mg total) by mouth daily. 90 tablet 3  . zolpidem (AMBIEN) 5 MG tablet Take 5 mg by mouth at bedtime as needed.  5   No current facility-administered medications for this visit.      Allergies:   Lisinopril   Social History   Social History  . Marital status: Single    Spouse name: N/A  . Number of children: 0  . Years of education: N/A   Social History Main Topics  . Smoking status: Never Smoker  . Smokeless tobacco: Never Used  . Alcohol use 0.6 oz/week    1 Cans of beer per week     Comment: socially  . Drug use: No  . Sexual activity: Yes    Birth  control/ protection: None   Other Topics Concern  . None   Social History Narrative  . None     Family History:  The patient's family history includes Coronary artery disease in his brother; Hypertension in his brother, brother, and mother.   ROS:   Please see the history of present illness.  All other systems are reviewed and otherwise negative.    PHYSICAL EXAM:   VS:  BP (!) 130/100 (BP Location: Left Arm, Patient Position: Sitting, Cuff Size: Large)   Pulse 60   Ht 5\' 7"  (1.702 m)   Wt 184 lb (83.5 kg)   BMI 28.82 kg/m   BMI: Body mass index is 28.82 kg/m. GEN: Well nourished, well developed AAM, in no acute distress  HEENT: normocephalic, atraumatic Neck: no JVD, carotid  bruits, or masses Cardiac: RRR; no murmurs, rubs, or gallops, no edema  Respiratory:  clear to auscultation bilaterally, normal work of breathing GI: soft, nontender, nondistended, + BS MS: no deformity or atrophy  Skin: warm and dry, no rash Neuro:  Alert and Oriented x 3, Strength and sensation are intact, follows commands Psych: euthymic mood, full affect  Wt Readings from Last 3 Encounters:  11/26/16 184 lb (83.5 kg)  10/30/16 185 lb 12 oz (84.3 kg)  10/30/15 182 lb 6.4 oz (82.7 kg)      Studies/Labs Reviewed:   EKG: EKG was not ordered today.  Recent Labs: 10/30/2016: ALT 20; Hemoglobin 14.0; Platelets 244; TSH 1.34 11/09/2016: BUN 15; Creat 1.20; Potassium 4.1; Sodium 140   Lipid Panel    Component Value Date/Time   CHOL 147 10/30/2016 1018   TRIG 100 10/30/2016 1018   HDL 36 (L) 10/30/2016 1018   CHOLHDL 4.1 10/30/2016 1018   VLDL 20 10/30/2016 1018   LDLCALC 91 10/30/2016 1018    Additional studies/ records that were reviewed today include: Summarized above.    ASSESSMENT & PLAN:   1. LV mural thrombus - resolved by most recent echo. He remains on Eliquis at this time for history of PE at the discretion of his primary care provider. 2. Chronic combined CHF - EF somewhat  improved from prior. Reviewed 2g sodium/2L fluid restriction, daily weights, observation of sx with patient. Continue losartan, BB, spironolactone. 3. HTN - f/u labs today to help guide choice of med titration. Cannot titrate carvedilol further due to HR. Already at max dose losartan. Could consider titration of spironolactone versus addition of amlodipine.  4. CAD - asymptomatic. Not on ASA due to concomitant Eliquis. 5. Hyperlipidemia - recheck CMET/lipids today on atorvastatin.   Disposition: F/u with Dr. Harrell Lark 6 months.   Medication Adjustments/Labs and Tests Ordered: Current medicines are reviewed at length with the patient today.  Concerns regarding medicines are outlined above. Medication changes, Labs and Tests ordered today are summarized above and listed in the Patient Instructions accessible in Encounters.   Thomasene Mohair PA-C  11/26/2016 8:59 AM    [facility] [address] [place], [place], [place]  [address] Phone: [phone]; Fax: [phone]

## 2016-11-26 ENCOUNTER — Ambulatory Visit (INDEPENDENT_AMBULATORY_CARE_PROVIDER_SITE_OTHER): Payer: BLUE CROSS/BLUE SHIELD | Admitting: Physician Assistant

## 2016-11-26 ENCOUNTER — Encounter: Payer: Self-pay | Admitting: Physician Assistant

## 2016-11-26 VITALS — BP 130/100 | HR 60 | Ht 67.0 in | Wt 184.0 lb

## 2016-11-26 DIAGNOSIS — I5042 Chronic combined systolic (congestive) and diastolic (congestive) heart failure: Secondary | ICD-10-CM

## 2016-11-26 DIAGNOSIS — I10 Essential (primary) hypertension: Secondary | ICD-10-CM

## 2016-11-26 DIAGNOSIS — I2129 ST elevation (STEMI) myocardial infarction involving other sites: Secondary | ICD-10-CM

## 2016-11-26 DIAGNOSIS — Z9861 Coronary angioplasty status: Secondary | ICD-10-CM

## 2016-11-26 DIAGNOSIS — I236 Thrombosis of atrium, auricular appendage, and ventricle as current complications following acute myocardial infarction: Secondary | ICD-10-CM

## 2016-11-26 DIAGNOSIS — I2510 Atherosclerotic heart disease of native coronary artery without angina pectoris: Secondary | ICD-10-CM | POA: Diagnosis not present

## 2016-11-26 DIAGNOSIS — E785 Hyperlipidemia, unspecified: Secondary | ICD-10-CM

## 2016-11-26 NOTE — Patient Instructions (Signed)
Medication Instructions:  Your physician recommends that you continue on your current medications as directed. Please refer to the Current Medication list given to you today.  Labwork: Your physician recommends that you have lab work today CMET and lipids.  Testing/Procedures: NONE  Follow-Up: Your physician wants you to follow-up in: 6 months with Dr. [name]. You will receive a reminder letter in the mail two months in advance. If you don't receive a letter, please call our office to schedule the follow-up appointment.    If you need a refill on your cardiac medications before your next appointment, please call your pharmacy.

## 2016-11-27 ENCOUNTER — Other Ambulatory Visit: Payer: Self-pay | Admitting: *Deleted

## 2016-11-27 LAB — COMPREHENSIVE METABOLIC PANEL
ALK PHOS: 69 IU/L (ref 39–117)
ALT: 28 IU/L (ref 0–44)
AST: 23 IU/L (ref 0–40)
Albumin/Globulin Ratio: 2.1 (ref 1.2–2.2)
Albumin: 4.2 g/dL (ref 3.5–5.5)
BILIRUBIN TOTAL: 0.9 mg/dL (ref 0.0–1.2)
BUN / CREAT RATIO: 10 (ref 9–20)
BUN: 11 mg/dL (ref 6–24)
CO2: 24 mmol/L (ref 18–29)
Calcium: 9.1 mg/dL (ref 8.7–10.2)
Chloride: 101 mmol/L (ref 96–106)
Creatinine, Ser: 1.15 mg/dL (ref 0.76–1.27)
GFR calc Af Amer: 87 mL/min/{1.73_m2} (ref >59)
GFR calc non Af Amer: 75 mL/min/{1.73_m2} (ref >59)
GLUCOSE: 98 mg/dL (ref 65–99)
Globulin, Total: 2 g/dL (ref 1.5–4.5)
Potassium: 4.4 mmol/L (ref 3.5–5.2)
Sodium: 140 mmol/L (ref 134–144)
Total Protein: 6.2 g/dL (ref 6.0–8.5)

## 2016-11-27 LAB — LIPID PANEL
Chol/HDL Ratio: 3.2 ratio units (ref 0.0–5.0)
Cholesterol, Total: 131 mg/dL (ref 100–199)
HDL: 41 mg/dL (ref >39)
LDL CALC: 74 mg/dL (ref 0–99)
Triglycerides: 78 mg/dL (ref 0–149)
VLDL CHOLESTEROL CAL: 16 mg/dL (ref 5–40)

## 2016-11-27 MED ORDER — AMLODIPINE BESYLATE 2.5 MG PO TABS: 2.5000 mg | ORAL_TABLET | Freq: Every day | ORAL | 3 refills | Status: DC

## 2016-11-30 NOTE — Telephone Encounter (Signed)
Per note on pt chart, Rodney Marquez has made pt aware of his results.

## 2017-03-20 ENCOUNTER — Other Ambulatory Visit: Payer: Self-pay | Admitting: Physician Assistant

## 2017-07-07 ENCOUNTER — Ambulatory Visit (INDEPENDENT_AMBULATORY_CARE_PROVIDER_SITE_OTHER): Payer: BLUE CROSS/BLUE SHIELD | Admitting: Cardiovascular Disease

## 2017-07-07 ENCOUNTER — Encounter: Payer: Self-pay | Admitting: Cardiovascular Disease

## 2017-07-07 VITALS — BP 122/80 | HR 81 | Ht 67.0 in | Wt 186.6 lb

## 2017-07-07 DIAGNOSIS — I10 Essential (primary) hypertension: Secondary | ICD-10-CM

## 2017-07-07 DIAGNOSIS — I5042 Chronic combined systolic (congestive) and diastolic (congestive) heart failure: Secondary | ICD-10-CM | POA: Diagnosis not present

## 2017-07-07 DIAGNOSIS — I2510 Atherosclerotic heart disease of native coronary artery without angina pectoris: Secondary | ICD-10-CM

## 2017-07-07 DIAGNOSIS — E7800 Pure hypercholesterolemia, unspecified: Secondary | ICD-10-CM | POA: Diagnosis not present

## 2017-07-07 DIAGNOSIS — I236 Thrombosis of atrium, auricular appendage, and ventricle as current complications following acute myocardial infarction: Secondary | ICD-10-CM | POA: Diagnosis not present

## 2017-07-07 NOTE — Patient Instructions (Signed)

## 2017-07-07 NOTE — Progress Notes (Signed)
Chief Complaint  Patient presents with  . Follow-up    CAD    History of Present Illness: 51 yo male with history of CAD, HTN, HLD, SVT, ischemic cardiomyopathy, PE, PVCs, PACs, chronic combined CHF and LV apical thrombus who is here today for cardiac follow up. He had an anterior STEMI in November 2012 secondary to an occluded LAD that was treated with a bare metal stent. He was treated with short term anti-coagulation for LV apical thrombus. LV function normalized on echo in 2013 and his anti-coagulation was stopped. He was seen in the [facility] ED with weakness 04/02/15 and had negative head MRI. Labs ok. Per pt primary care found a PE on CTA and started Eliquis. Echo in 2016 with apical HK reported with possible LV apical thrombus and he was started on Eliquis. I saw him 04/12/15 and he was still feeling poorly. I arranged a stress myoview and 48 hour monitor. He did not complete these tests. He was admitted to [facility] 04/27/15 with fatigue and had an inpatient stress myoview with no ischemia. He did not tolerate Metoprolol but has tolerated Coreg. 48 hour monitor June 2016 with 12 beat run NSVT, rare PVCs, rare PACs. Echo December 2017 with LVEF=45-50%, apical dyskinesis with no evidence of thrombus, grade 1 diastolic dysfunction.   He is here today for follow up. The patient denies any chest pain, palpitations, lower extremity edema, orthopnea, PND, dizziness, near syncope or syncope. He has occasional dyspnea but does not feel that he has had any volume overload.   Primary Care Physician:  Georgann Housekeeper, MD  Past Medical History:  Diagnosis Date  . Cardiomyopathy, ischemic 09/2011  . Chronic combined systolic and diastolic CHF (congestive heart failure) (HCC)   . Coronary artery disease    late presentation with ant STEMI 11/12: LHC 10/12/11: oLM 20%, LAD occluded with dissection 2/2 MI.  PCI with BMS to LAD with poor distal runoff; small caliber apical vessel c/w edematous apex from delayed  presentation.  . Elevated LFTs    Abdominal ultrasound was unremarkable  . Family history of early CAD    Brother had MI at age 63 yo.  [name] HLD (hyperlipidemia)   . HTN (hypertension)   . 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 After STEMI 2012, also noted on CT in 2016. b. 2D echo 2017: no thrombus noted.  [name] NSVT (nonsustained ventricular tachycardia) (HCC)   . PE (pulmonary embolism) 03/2015   a. CTA 03/2015: very small amount of nonocclusive distal segmental/proximal subsegmental left lower lobe embolus as well as myocardial thinning of the left ventricular apex consistent with prior infarction; Adjacent filling defect may reflect thrombus in the left ventricular apex.   . Premature atrial contractions   . PSVT (paroxysmal supraventricular tachycardia) (HCC)    s/p adenocard 12 mg IVP 10/2011  . PVC's (premature ventricular contractions)   . ST elevation myocardial infarction (STEMI) of anterior wall (HCC) 09/2011   a. 09/2011 s/p BMS to occluded LAD.    Past Surgical History:  Procedure Laterality Date  . CORONARY ANGIOGRAM N/A 10/12/2011   Procedure: CORONARY ANGIOGRAM;  Surgeon: Herby Abraham, MD;  Location: [facility] CATH LAB;  Service: Cardiovascular;  Laterality: N/A;  . LEG SURGERY     cyst removal  . PERCUTANEOUS STENT INTERVENTION N/A 10/12/2011   Procedure: PERCUTANEOUS STENT INTERVENTION;  Surgeon: Herby Abraham, MD;  Location: [facility] CATH LAB;  Service: Cardiovascular;  Laterality: N/A;  . WRIST  SURGERY     cyst removal     Current Outpatient Prescriptions  Medication Sig Dispense Refill  . acetaminophen (TYLENOL) 325 MG tablet Take 650 mg by mouth every 6 (six) hours as needed for mild pain.    [name] amLODipine (NORVASC) 2.5 MG tablet TAKE 1 TABLET BY MOUTH EVERY DAY 30 tablet 7  . atorvastatin (LIPITOR) 40 MG tablet Take 1 tablet (40 mg total) by mouth every evening. 90 tablet 3  . carvedilol (COREG) 12.5 MG tablet Take 1 tablet (12.5 mg total)  by mouth 2 (two) times daily with a meal. 180 tablet 3  . clonazePAM (KLONOPIN) 1 MG tablet Take 1 mg by mouth daily.  2  . ELIQUIS 5 MG TABS tablet Take 1 tablet by mouth 2 (two) times daily.    [name] losartan (COZAAR) 100 MG tablet TAKE 1 TABLET BY MOUTH EVERY DAY 30 tablet 11  . nitroGLYCERIN (NITROSTAT) 0.4 MG SL tablet Place 1 tablet (0.4 mg total) under the tongue every 5 (five) minutes as needed for chest pain. 25 tablet 3  . sertraline (ZOLOFT) 50 MG tablet Take 0.5 tablets (25 mg total) by mouth daily.    [name] spironolactone (ALDACTONE) 25 MG tablet Take 1 tablet (25 mg total) by mouth daily. 90 tablet 3  . zolpidem (AMBIEN) 5 MG tablet Take 5 mg by mouth at bedtime as needed.  5   No current facility-administered medications for this visit.     Allergies  Allergen Reactions  . Lisinopril Cough    Social History   Social History  . Marital status: Single    Spouse name: N/A  . Number of children: 0  . Years of education: N/A   Occupational History  . Not on file.   Social History Main Topics  . Smoking status: Never Smoker  . Smokeless tobacco: Never Used  . Alcohol use 0.6 oz/week    1 Cans of beer per week     Comment: socially  . Drug use: No  . Sexual activity: Yes    Birth control/ protection: None   Other Topics Concern  . Not on file   Social History Narrative  . No narrative on file    Family History  Problem Relation Age of Onset  . Hypertension Mother   . Hypertension Brother   . Hypertension Brother   . Coronary artery disease Brother        Had MI in his 40 yo    Review of Systems:  As stated in the HPI and otherwise negative.   BP 122/80   Pulse 81   Ht 5\' 7"  (1.702 m)   Wt 186 lb 9.6 oz (84.6 kg)   SpO2 97%   BMI 29.23 kg/m   Physical Examination:  General: Well developed, well nourished, NAD  HEENT: OP clear, mucus membranes moist  SKIN: warm, dry. No rashes. Neuro: No focal deficits  Musculoskeletal: Muscle strength 5/5 all ext    Psychiatric: Mood and affect normal  Neck: No JVD, no carotid bruits, no thyromegaly, no lymphadenopathy.  Lungs:Clear bilaterally, no wheezes, rhonci, crackles Cardiovascular: Regular rate and rhythm. No murmurs, gallops or rubs. Abdomen:Soft. Bowel sounds present. Non-tender.  Extremities: No lower extremity edema. Pulses are 2 + in the bilateral DP/PT.  EKG:  EKG is not ordered today. The ekg ordered today demonstrates   Recent Labs: 10/30/2016: Hemoglobin 14.0; Platelets 244; TSH 1.34 11/26/2016: ALT 28; BUN 11; Creatinine, Ser 1.15; Potassium 4.4; Sodium 140   Lipid  Panel    Component Value Date/Time   CHOL 131 11/26/2016 0928   TRIG 78 11/26/2016 0928   HDL 41 11/26/2016 0928   CHOLHDL 3.2 11/26/2016 0928   CHOLHDL 4.1 10/30/2016 1018   VLDL 20 10/30/2016 1018   LDLCALC 74 11/26/2016 0928     Wt Readings from Last 3 Encounters:  07/07/17 186 lb 9.6 oz (84.6 kg)  11/26/16 184 lb (83.5 kg)  10/30/16 185 lb 12 oz (84.3 kg)     Other studies Reviewed: Additional studies/ records that were reviewed today include: . Review of the above records demonstrates:   Assessment and Plan:   1. CAD without angina: he has no chest pain suggestive of angina. He is on a statin and beta blocker. No ASA given use of Eliquis.   2. HTN: BP is controlled today. He wants to stop his Norvasc. I have told him to hold for two weeks and follow BP at home  3. LV apical thrombus: resolved by echo in 2017 but given continue apical hypokinesis, I would continue anti-coagulation long term as it is clear now that his apex has scar tissue and is high risk for thrombus formation.   4. Chronic combined systolic and diastolic CHF: Volume status is ok today. Will continue ARB, beta blocker and aldactone.   5. HLD: LDL is at goal. Continue statin.   Current medicines are reviewed at length with the patient today.  The patient does not have concerns regarding medicines.  The following changes have been  made:  no change  Labs/ tests ordered today include:  No orders of the defined types were placed in this encounter.  Disposition:   FU with me in 6 months  Signed, Verne Carrow, MD 07/07/2017 4:55 PM    [facility] [address] [place], [place], [place]  [address] Phone: [phone]; Fax: [phone]

## 2017-11-07 ENCOUNTER — Other Ambulatory Visit: Payer: Self-pay | Admitting: Physician Assistant

## 2017-11-10 ENCOUNTER — Other Ambulatory Visit: Payer: Self-pay | Admitting: Physician Assistant

## 2018-01-13 ENCOUNTER — Ambulatory Visit (INDEPENDENT_AMBULATORY_CARE_PROVIDER_SITE_OTHER): Payer: BLUE CROSS/BLUE SHIELD | Admitting: Cardiology

## 2018-01-13 ENCOUNTER — Encounter: Payer: Self-pay | Admitting: Cardiology

## 2018-01-13 VITALS — BP 106/64 | HR 62 | Ht 67.0 in | Wt 178.4 lb

## 2018-01-13 DIAGNOSIS — I2510 Atherosclerotic heart disease of native coronary artery without angina pectoris: Secondary | ICD-10-CM

## 2018-01-13 DIAGNOSIS — Z9861 Coronary angioplasty status: Secondary | ICD-10-CM | POA: Diagnosis not present

## 2018-01-13 NOTE — Progress Notes (Signed)
01/13/2018 Rodney Marquez   Mar 09, 1966  [id number]  Primary Physician Georgann Housekeeper, MD Primary Cardiologist: Dr. [name]   Reason for Visit/CC: Routine f/u for CAD and Chronic Systolic HF  HPI: 52 yo male with history of CAD, HTN, HLD, SVT, ischemic cardiomyopathy, PE, PVCs, PACs, chronic combined CHF and LV apical thrombus who is here today for cardiac follow up. He is followed by Dr. [name]. He had an anterior STEMI in November 2012 secondary to an occluded LAD that was treated with a bare metal stent. He was treated with short term anti-coagulation for LV apical thrombus. LV function normalized on echo in 2013 and his anti-coagulation was stopped. He was seen in the [facility] ED with weakness 04/02/15 and had negative head MRI. Labs ok. Per pt primary care found a PE on CTA and started Eliquis. Echo in 2016 with apical HK reported with possible LV apical thrombus and he was started on Eliquis. In May 2016 he was evaluated for fatigue. Stress test was ordered and showed no ischemia. 48 hour monitor June 2016 with 12 beat run NSVT, rare PVCs, rare PACs. Echo December 2017 with LVEF=45-50%, apical dyskinesis with no evidence of thrombus, grade 1 diastolic dysfunction.   He was last seen by Dr. [name] 07/07/17. He appeared to be doing well at the time. Meds were continued. It was outlined by Dr. [name] to continue anti-coagulation long term as it is clear now that his apex has scar tissue and is high risk for thrombus formation. Pt was instructed to f/u in 6 months.   He presents to clinic today for f/u. He reports that he has done well. No chest pain, dyspnea, orthopnea, PND, LEE palpitations. He reports full med compliance. No side effects or intolerances.    Current Meds  Medication Sig  . acetaminophen (TYLENOL) 325 MG tablet Take 650 mg by mouth every 6 (six) hours as needed for mild pain.  [name] amLODipine (NORVASC) 2.5 MG tablet TAKE 1 TABLET BY MOUTH EVERY DAY  . atorvastatin (LIPITOR) 40  MG tablet TAKE 1 TABLET BY MOUTH EVERY EVENING  . carvedilol (COREG) 12.5 MG tablet Take 1 tablet (12.5 mg total) by mouth 2 (two) times daily with a meal.  . clonazePAM (KLONOPIN) 1 MG tablet Take 1 mg by mouth daily.  [name] ELIQUIS 5 MG TABS tablet Take 1 tablet by mouth 2 (two) times daily.  [name] losartan (COZAAR) 100 MG tablet TAKE 1 TABLET BY MOUTH EVERY DAY  . nitroGLYCERIN (NITROSTAT) 0.4 MG SL tablet Place 1 tablet (0.4 mg total) under the tongue every 5 (five) minutes as needed for chest pain.  [name] sertraline (ZOLOFT) 50 MG tablet Take 0.5 tablets (25 mg total) by mouth daily.  [name] spironolactone (ALDACTONE) 25 MG tablet TAKE 1 TABLET BY MOUTH EVERY DAY  . zolpidem (AMBIEN) 5 MG tablet Take 5 mg by mouth at bedtime as needed.   Allergies  Allergen Reactions  . Lisinopril Cough   Past Medical History:  Diagnosis Date  . Cardiomyopathy, ischemic 09/2011  . Chronic combined systolic and diastolic CHF (congestive heart failure) (HCC)   . Coronary artery disease    late presentation with ant STEMI 11/12: LHC 10/12/11: oLM 20%, LAD occluded with dissection 2/2 MI.  PCI with BMS to LAD with poor distal runoff; small caliber apical vessel c/w edematous apex from delayed presentation.  . Elevated LFTs    Abdominal ultrasound was unremarkable  . Family history of early CAD    Brother had MI  at age 53 yo.  [name] HLD (hyperlipidemia)   . HTN (hypertension)   . 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 After STEMI 2012, also noted on CT in 2016. b. 2D echo 2017: no thrombus noted.  [name] NSVT (nonsustained ventricular tachycardia) (HCC)   . PE (pulmonary embolism) 03/2015   a. CTA 03/2015: very small amount of nonocclusive distal segmental/proximal subsegmental left lower lobe embolus as well as myocardial thinning of the left ventricular apex consistent with prior infarction; Adjacent filling defect may reflect thrombus in the left ventricular apex.   . Premature atrial  contractions   . PSVT (paroxysmal supraventricular tachycardia) (HCC)    s/p adenocard 12 mg IVP 10/2011  . PVC's (premature ventricular contractions)   . ST elevation myocardial infarction (STEMI) of anterior wall (HCC) 09/2011   a. 09/2011 s/p BMS to occluded LAD.   Family History  Problem Relation Age of Onset  . Hypertension Mother   . Hypertension Brother   . Hypertension Brother   . Coronary artery disease Brother        Had MI in his 85 yo   Past Surgical History:  Procedure Laterality Date  . CORONARY ANGIOGRAM N/A 10/12/2011   Procedure: CORONARY ANGIOGRAM;  Surgeon: Herby Abraham, MD;  Location: [facility] CATH LAB;  Service: Cardiovascular;  Laterality: N/A;  . LEG SURGERY     cyst removal  . PERCUTANEOUS STENT INTERVENTION N/A 10/12/2011   Procedure: PERCUTANEOUS STENT INTERVENTION;  Surgeon: Herby Abraham, MD;  Location: [facility] CATH LAB;  Service: Cardiovascular;  Laterality: N/A;  . WRIST SURGERY     cyst removal    Social History   Socioeconomic History  . Marital status: Single    Spouse name: Not on file  . Number of children: 0  . Years of education: Not on file  . Highest education level: Not on file  Social Needs  . Financial resource strain: Not on file  . Food insecurity - worry: Not on file  . Food insecurity - inability: Not on file  . Transportation needs - medical: Not on file  . Transportation needs - non-medical: Not on file  Occupational History  . Not on file  Tobacco Use  . Smoking status: Never Smoker  . Smokeless tobacco: Never Used  Substance and Sexual Activity  . Alcohol use: Yes    Alcohol/week: 0.6 oz    Types: 1 Cans of beer per week    Comment: socially  . Drug use: No  . Sexual activity: Yes    Birth control/protection: None  Other Topics Concern  . Not on file  Social History Narrative  . Not on file     Review of Systems: General: negative for chills, fever, night sweats or weight changes.  Cardiovascular: negative for  chest pain, dyspnea on exertion, edema, orthopnea, palpitations, paroxysmal nocturnal dyspnea or shortness of breath Dermatological: negative for rash Respiratory: negative for cough or wheezing Urologic: negative for hematuria Abdominal: negative for nausea, vomiting, diarrhea, bright red blood per rectum, melena, or hematemesis Neurologic: negative for visual changes, syncope, or dizziness All other systems reviewed and are otherwise negative except as noted above.   Physical Exam:  Height 5\' 7"  (1.702 m).  General appearance: alert, cooperative and no distress Neck: no carotid bruit and no JVD Lungs: clear to auscultation bilaterally Heart: regular rate and rhythm, S1, S2 normal, no murmur, click, rub or gallop Extremities: extremities normal, atraumatic, no cyanosis or edema Pulses:  2+ and symmetric Skin: Skin color, texture, turgor normal. No rashes or lesions Neurologic: Grossly normal  EKG NSR, inferior infarct, unchanged from previous -- personally reviewed   ASSESSMENT AND PLAN:   1. CAD: stable w/o angina. No exertional CP or dsypnea. Continue medical therapy. No ASA given Eliquis use. Continue BB, statin and ARB.   2. HTN: controlled on current regimen. 106/64. Pt would like to come off of some of his meds. He discussed with Dr. [name] stopping amlodipine at last OV and Dr. [name] approved, although pt has not yet tried this. He can try holding amlodipine x 2 weeks and monitor at home. Goal BP is <120/80. If able to keep BP controlled w/o amlodipine, then he can permanently discontinue. He will notify our office if he decides to do this.  Recommend that he continue Coreg and Losartan given systolic HF history.    3. Combined Systolic and Diastolic CHF: volume stable. No dyspnea. Lungs are CTAB. No LEE. Continue BB and ARB.   4. H/o LV Thrombus: Per Dr. [name], continue anti-coagulation long term as it is clear now that his apex has scar tissue and is high risk for  thrombus formation. He is on Eliquis BID. Reports full compliance. No abnormal bleeding. Checking CBC.   5. HLD: overdue for repeat lipid panel. Last study was 11/2016. LDL was controlled at that time at 74 mg/dL. Will repeat study. Continue statin therapy. Will further advise on dosing based on LDL. Also check HFTs.   6. H/o PE: remains on Eliquis. Reports full compliance.    Follow-Up w/ Dr. [name] in 6 months.   Brittainy Delmer Islam, MHS [facility] 01/13/2018 1:38 PM

## 2018-01-13 NOTE — Patient Instructions (Addendum)
Medication Instructions:   Your physician recommends that you continue on your current medications as directed. Please refer to the Current Medication list given to you today.   If you need a refill on your cardiac medications before your next appointment, please call your pharmacy.  Labwork: RETURN  Monday FASTING LABS LIPID CMP AND CBC   YOU MAY NOT  EAT NO LATER  9 AM   Testing/Procedures: NONE ORDERED  TODAY     Follow-Up:  Your physician wants you to follow-up in:  IN  6  MONTHS WITH DR  [name]  You will receive a reminder letter in the mail two months in advance. If you don't receive a letter, please call our office to schedule the follow-up appointment.      Any Other Special Instructions Will Be Listed Below (If Applicable).

## 2018-01-17 ENCOUNTER — Other Ambulatory Visit: Payer: BLUE CROSS/BLUE SHIELD | Admitting: *Deleted

## 2018-01-17 DIAGNOSIS — I2510 Atherosclerotic heart disease of native coronary artery without angina pectoris: Secondary | ICD-10-CM

## 2018-01-17 DIAGNOSIS — Z9861 Coronary angioplasty status: Principal | ICD-10-CM

## 2018-01-18 ENCOUNTER — Encounter: Payer: Self-pay | Admitting: Cardiovascular Disease

## 2018-01-18 LAB — CBC
Hematocrit: 40.6 % (ref 37.5–51.0)
Hemoglobin: 13.7 g/dL (ref 13.0–17.7)
MCH: 31.8 pg (ref 26.6–33.0)
MCHC: 33.7 g/dL (ref 31.5–35.7)
MCV: 94 fL (ref 79–97)
Platelets: 247 10*3/uL (ref 150–379)
RBC: 4.31 x10E6/uL (ref 4.14–5.80)
RDW: 14.7 % (ref 12.3–15.4)
WBC: 6.1 10*3/uL (ref 3.4–10.8)

## 2018-01-18 LAB — COMPREHENSIVE METABOLIC PANEL
A/G RATIO: 2.1 (ref 1.2–2.2)
ALBUMIN: 4.4 g/dL (ref 3.5–5.5)
ALT: 24 IU/L (ref 0–44)
AST: 19 IU/L (ref 0–40)
Alkaline Phosphatase: 65 IU/L (ref 39–117)
BILIRUBIN TOTAL: 1 mg/dL (ref 0.0–1.2)
BUN / CREAT RATIO: 10 (ref 9–20)
BUN: 11 mg/dL (ref 6–24)
CO2: 25 mmol/L (ref 20–29)
Calcium: 9.2 mg/dL (ref 8.7–10.2)
Chloride: 103 mmol/L (ref 96–106)
Creatinine, Ser: 1.12 mg/dL (ref 0.76–1.27)
GFR calc non Af Amer: 77 mL/min/{1.73_m2} (ref >59)
GFR, EST AFRICAN AMERICAN: 89 mL/min/{1.73_m2} (ref >59)
Globulin, Total: 2.1 g/dL (ref 1.5–4.5)
Glucose: 79 mg/dL (ref 65–99)
POTASSIUM: 4.4 mmol/L (ref 3.5–5.2)
Sodium: 142 mmol/L (ref 134–144)
TOTAL PROTEIN: 6.5 g/dL (ref 6.0–8.5)

## 2018-01-18 LAB — LIPID PANEL
CHOL/HDL RATIO: 2.9 ratio (ref 0.0–5.0)
Cholesterol, Total: 115 mg/dL (ref 100–199)
HDL: 40 mg/dL (ref >39)
LDL Calculated: 65 mg/dL (ref 0–99)
TRIGLYCERIDES: 51 mg/dL (ref 0–149)
VLDL Cholesterol Cal: 10 mg/dL (ref 5–40)

## 2018-01-18 NOTE — Telephone Encounter (Signed)
This encounter was created in error - please disregard.

## 2018-01-18 NOTE — Telephone Encounter (Signed)
F/U Call:  Patient states that he is returning call for lab results.

## 2018-05-16 DIAGNOSIS — R7303 Prediabetes: Secondary | ICD-10-CM | POA: Diagnosis not present

## 2018-05-16 DIAGNOSIS — Z0000 Encounter for general adult medical examination without abnormal findings: Secondary | ICD-10-CM | POA: Diagnosis not present

## 2018-05-16 DIAGNOSIS — E782 Mixed hyperlipidemia: Secondary | ICD-10-CM | POA: Diagnosis not present

## 2018-05-16 DIAGNOSIS — I10 Essential (primary) hypertension: Secondary | ICD-10-CM | POA: Diagnosis not present

## 2018-05-16 DIAGNOSIS — Z13220 Encounter for screening for lipoid disorders: Secondary | ICD-10-CM | POA: Diagnosis not present

## 2018-05-16 DIAGNOSIS — Z1389 Encounter for screening for other disorder: Secondary | ICD-10-CM | POA: Diagnosis not present

## 2018-06-08 ENCOUNTER — Other Ambulatory Visit: Payer: Self-pay | Admitting: Internal Medicine

## 2018-06-08 DIAGNOSIS — I2510 Atherosclerotic heart disease of native coronary artery without angina pectoris: Secondary | ICD-10-CM | POA: Diagnosis not present

## 2018-06-08 DIAGNOSIS — R634 Abnormal weight loss: Secondary | ICD-10-CM | POA: Diagnosis not present

## 2018-06-08 DIAGNOSIS — R1012 Left upper quadrant pain: Secondary | ICD-10-CM | POA: Diagnosis not present

## 2018-06-08 DIAGNOSIS — I2699 Other pulmonary embolism without acute cor pulmonale: Secondary | ICD-10-CM | POA: Diagnosis not present

## 2018-06-08 DIAGNOSIS — M1990 Unspecified osteoarthritis, unspecified site: Secondary | ICD-10-CM | POA: Diagnosis not present

## 2018-06-09 ENCOUNTER — Ambulatory Visit
Admission: RE | Admit: 2018-06-09 | Discharge: 2018-06-09 | Disposition: A | Discharge status: Home / Self Care | Payer: BLUE CROSS/BLUE SHIELD | Source: Ambulatory Visit | Attending: Internal Medicine | Admitting: Internal Medicine

## 2018-06-09 DIAGNOSIS — R1012 Left upper quadrant pain: Secondary | ICD-10-CM | POA: Diagnosis not present

## 2018-07-26 ENCOUNTER — Other Ambulatory Visit: Payer: Self-pay | Admitting: Cardiovascular Disease

## 2018-07-26 MED ORDER — ATORVASTATIN CALCIUM 40 MG PO TABS: 40.0000 mg | ORAL_TABLET | Freq: Every evening | ORAL | 1 refills | Status: DC

## 2018-08-28 NOTE — Progress Notes (Signed)
Chief Complaint  Patient presents with  . Follow-up    CAD    History of Present Illness: 52 yo male with history of CAD, HTN, HLD, SVT, ischemic cardiomyopathy, PE, PVCs, PACs, chronic combined CHF and LV apical thrombus who is here today for cardiac follow up. He had an anterior STEMI in November 2012 secondary to an occluded LAD that was treated with a bare metal stent. He was treated with short term anti-coagulation for LV apical thrombus. LV function normalized on echo in 2013 and his anti-coagulation was stopped. He was seen in the [facility] ED with weakness 04/02/15 and had negative head MRI. Labs ok. Per pt primary care found a PE on CTA and started Eliquis. Echo in 2016 with apical HK reported with possible LV apical thrombus. I saw him 04/12/15 and he was still feeling poorly. I arranged a stress myoview and 48 hour monitor. He did not complete these tests. He was admitted to [facility] 04/27/15 with fatigue and had an inpatient stress myoview with no ischemia. He did not tolerate Metoprolol but has tolerated Coreg. 48 hour monitor June 2016 with 12 beat run NSVT, rare PVCs, rare PACs. Echo December 2017 with LVEF=45-50%, apical dyskinesis with no evidence of thrombus, grade 1 diastolic dysfunction.   He is here today for follow up. The patient denies any chest pain, dyspnea, palpitations, lower extremity edema, orthopnea, PND, dizziness, near syncope or syncope.   Primary Care Physician:  Georgann Housekeeper, MD  Past Medical History:  Diagnosis Date  . Cardiomyopathy, ischemic 09/2011  . Chronic combined systolic and diastolic CHF (congestive heart failure) (HCC)   . Coronary artery disease    late presentation with ant STEMI 11/12: LHC 10/12/11: oLM 20%, LAD occluded with dissection 2/2 MI.  PCI with BMS to LAD with poor distal runoff; small caliber apical vessel c/w edematous apex from delayed presentation.  . Elevated LFTs    Abdominal ultrasound was unremarkable  . Family history of early CAD     Brother had MI at age 54 yo.  [name] HLD (hyperlipidemia)   . HTN (hypertension)   . 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 After STEMI 2012, also noted on CT in 2016. b. 2D echo 2017: no thrombus noted.  [name] NSVT (nonsustained ventricular tachycardia) (HCC)   . PE (pulmonary embolism) 03/2015   a. CTA 03/2015: very small amount of nonocclusive distal segmental/proximal subsegmental left lower lobe embolus as well as myocardial thinning of the left ventricular apex consistent with prior infarction; Adjacent filling defect may reflect thrombus in the left ventricular apex.   . Premature atrial contractions   . PSVT (paroxysmal supraventricular tachycardia) (HCC)    s/p adenocard 12 mg IVP 10/2011  . PVC's (premature ventricular contractions)   . ST elevation myocardial infarction (STEMI) of anterior wall (HCC) 09/2011   a. 09/2011 s/p BMS to occluded LAD.    Past Surgical History:  Procedure Laterality Date  . CORONARY ANGIOGRAM N/A 10/12/2011   Procedure: CORONARY ANGIOGRAM;  Surgeon: Herby Abraham, MD;  Location: [facility] C[facility] LAB;  Service: Cardiovascular;  Laterality: N/A;  . LEG SURGERY     cyst removal  . PERCUTANEOUS STENT INTERVENTION N/A 10/12/2011   Procedure: PERCUTANEOUS STENT INTERVENTION;  Surgeon: Herby Abraham, MD;  Location: [facility] CATH LAB;  Service: Cardiovascular;  Laterality: N/A;  . WRIST SURGERY     cyst removal     Current Outpatient Medications  Medication Sig Dispense Refill  .  acetaminophen (TYLENOL) 325 MG tablet Take 650 mg by mouth every 6 (six) hours as needed for mild pain.    [name] atorvastatin (LIPITOR) 40 MG tablet Take 1 tablet (40 mg total) by mouth every evening. 90 tablet 1  . carvedilol (COREG) 12.5 MG tablet Take 1 tablet (12.5 mg total) by mouth 2 (two) times daily with a meal. 180 tablet 3  . clonazePAM (KLONOPIN) 1 MG tablet Take 1 mg by mouth daily.  2  . ELIQUIS 5 MG TABS tablet Take 1 tablet by mouth 2 (two) times daily.     [name] losartan (COZAAR) 100 MG tablet TAKE 1 TABLET BY MOUTH EVERY DAY 30 tablet 11  . nitroGLYCERIN (NITROSTAT) 0.4 MG SL tablet Place 1 tablet (0.4 mg total) under the tongue every 5 (five) minutes as needed for chest pain. 25 tablet 3  . sertraline (ZOLOFT) 50 MG tablet Take 0.5 tablets (25 mg total) by mouth daily.    [name] spironolactone (ALDACTONE) 25 MG tablet TAKE 1 TABLET BY MOUTH EVERY DAY 90 tablet 2  . zolpidem (AMBIEN) 5 MG tablet Take 5 mg by mouth at bedtime as needed.  5   No current facility-administered medications for this visit.     Allergies  Allergen Reactions  . Lisinopril Cough    Social History   Socioeconomic History  . Marital status: Single    Spouse name: Not on file  . Number of children: 0  . Years of education: Not on file  . Highest education level: Not on file  Occupational History  . Not on file  Social Needs  . Financial resource strain: Not on file  . Food insecurity:    Worry: Not on file    Inability: Not on file  . Transportation needs:    Medical: Not on file    Non-medical: Not on file  Tobacco Use  . Smoking status: Never Smoker  . Smokeless tobacco: Never Used  Substance and Sexual Activity  . Alcohol use: Yes    Alcohol/week: 1.0 standard drinks    Types: 1 Cans of beer per week    Comment: socially  . Drug use: No  . Sexual activity: Yes    Birth control/protection: None  Lifestyle  . Physical activity:    Days per week: Not on file    Minutes per session: Not on file  . Stress: Not on file  Relationships  . Social connections:    Talks on phone: Not on file    Gets together: Not on file    Attends religious service: Not on file    Active member of club or organization: Not on file    Attends meetings of clubs or organizations: Not on file    Relationship status: Not on file  . Intimate partner violence:    Fear of current or ex partner: Not on file    Emotionally abused: Not on file    Physically abused: Not on file     Forced sexual activity: Not on file  Other Topics Concern  . Not on file  Social History Narrative  . Not on file    Family History  Problem Relation Age of Onset  . Hypertension Mother   . Hypertension Brother   . Hypertension Brother   . Coronary artery disease Brother        Had MI in his 16 yo    Review of Systems:  As stated in the HPI and otherwise negative.   BP  126/80   Pulse 62   Ht  (1.702 m)   Wt 179 lb 1.9 oz (81.2 kg)   SpO2 98%   BMI 28.05 kg/m   Physical Examination:  General: Well developed, well nourished, NAD  HEENT: OP clear, mucus membranes moist  SKIN: warm, dry. No rashes. Neuro: No focal deficits  Musculoskeletal: Muscle strength 5/5 all ext  Psychiatric: Mood and affect normal  Neck: No JVD, no carotid bruits, no thyromegaly, no lymphadenopathy.  Lungs:Clear bilaterally, no wheezes, rhonci, crackles Cardiovascular: Regular rate and rhythm. No murmurs, gallops or rubs. Abdomen:Soft. Bowel sounds present. Non-tender.  Extremities: No lower extremity edema. Pulses are 2 + in the bilateral DP/PT.  EKG:  EKG is not ordered today. The ekg ordered today demonstrates   Recent Labs: 01/17/2018: ALT 24; BUN 11; Creatinine, Ser 1.12; Hemoglobin 13.7; Platelets 247; Potassium 4.4; Sodium 142   Lipid Panel    Component Value Date/Time   CHOL 115 01/17/2018 1627   TRIG 51 01/17/2018 1627   HDL 40 01/17/2018 1627   CHOLHDL 2.9 01/17/2018 1627   CHOLHDL 4.1 10/30/2016 1018   VLDL 20 10/30/2016 1018   LDLCALC 65 01/17/2018 1627     Wt Readings from Last 3 Encounters:  08/29/18 179 lb 1.9 oz (81.2 kg)  01/13/18 178 lb 6.4 oz (80.9 kg)  07/07/17 186 lb 9.6 oz (84.6 kg)     Other studies Reviewed: Additional studies/ records that were reviewed today include: . Review of the above records demonstrates:   Assessment and Plan:   1. CAD without angina: No chest pain. Continue beta blocker and statin. No ASA since he is on Eliquis  chronically.    2. HTN: BP is controlled. Continue current medications.   3. LV apical thrombus: Resolved by echo in 2017 but given continue apical hypokinesis, I would continue anti-coagulation long term as it is clear now that his apex has scar tissue and is high risk for thrombus formation. Continue Eliquis.   4. Ischemic cardiomyopathy/Chronic combined systolic and diastolic CHF: Weight is stable. Volume status is normal today. Continue beta blocker, ARB and aldactone. He is NYHA class 1. I would consider adding Entresto if he becomes symptomatic (NYHA class 2).   5. HLD: Lipids controlled. Continue statin.   Current medicines are reviewed at length with the patient today.  The patient does not have concerns regarding medicines.  The following changes have been made:  no change  Labs/ tests ordered today include:  No orders of the defined types were placed in this encounter.  Disposition:   FU with me in  months  Signed, Verne Carrow, MD 08/29/2018 4:15 PM    [facility] [address] [place], [place], [place]  [address] Phone: [phone]; Fax: [phone]

## 2018-08-29 ENCOUNTER — Ambulatory Visit (INDEPENDENT_AMBULATORY_CARE_PROVIDER_SITE_OTHER): Payer: BLUE CROSS/BLUE SHIELD | Admitting: Cardiovascular Disease

## 2018-08-29 ENCOUNTER — Encounter: Payer: Self-pay | Admitting: Cardiovascular Disease

## 2018-08-29 VITALS — BP 126/80 | HR 62 | Ht 67.0 in | Wt 179.1 lb

## 2018-08-29 DIAGNOSIS — I2510 Atherosclerotic heart disease of native coronary artery without angina pectoris: Secondary | ICD-10-CM | POA: Diagnosis not present

## 2018-08-29 DIAGNOSIS — I10 Essential (primary) hypertension: Secondary | ICD-10-CM

## 2018-08-29 DIAGNOSIS — I236 Thrombosis of atrium, auricular appendage, and ventricle as current complications following acute myocardial infarction: Secondary | ICD-10-CM

## 2018-08-29 DIAGNOSIS — I255 Ischemic cardiomyopathy: Secondary | ICD-10-CM

## 2018-08-29 DIAGNOSIS — E7800 Pure hypercholesterolemia, unspecified: Secondary | ICD-10-CM

## 2018-08-29 DIAGNOSIS — I5042 Chronic combined systolic (congestive) and diastolic (congestive) heart failure: Secondary | ICD-10-CM

## 2018-08-29 NOTE — Patient Instructions (Signed)
Medication Instructions:  Your physician recommends that you continue on your current medications as directed. Please refer to the Current Medication list given to you today.   Labwork: none  Testing/Procedures: none  Follow-Up: Your physician wants you to follow-up in: 6 months with B. Sharol Harness, [place] and 12 months with Dr. [name]. You will receive a reminder letter in the mail two months in advance. If you don't receive a letter, please call our office to schedule the follow-up appointment.   Any Other Special Instructions Will Be Listed Below (If Applicable).     If you need a refill on your cardiac medications before your next appointment, please call your pharmacy.

## 2018-11-22 DIAGNOSIS — E782 Mixed hyperlipidemia: Secondary | ICD-10-CM | POA: Diagnosis not present

## 2018-11-22 DIAGNOSIS — I10 Essential (primary) hypertension: Secondary | ICD-10-CM | POA: Diagnosis not present

## 2018-11-22 DIAGNOSIS — I429 Cardiomyopathy, unspecified: Secondary | ICD-10-CM | POA: Diagnosis not present

## 2018-11-22 DIAGNOSIS — I2510 Atherosclerotic heart disease of native coronary artery without angina pectoris: Secondary | ICD-10-CM | POA: Diagnosis not present

## 2018-11-22 DIAGNOSIS — R7303 Prediabetes: Secondary | ICD-10-CM | POA: Diagnosis not present

## 2018-12-21 DIAGNOSIS — Z1211 Encounter for screening for malignant neoplasm of colon: Secondary | ICD-10-CM | POA: Diagnosis not present

## 2018-12-21 DIAGNOSIS — I255 Ischemic cardiomyopathy: Secondary | ICD-10-CM | POA: Diagnosis not present

## 2018-12-21 DIAGNOSIS — Z8679 Personal history of other diseases of the circulatory system: Secondary | ICD-10-CM | POA: Diagnosis not present

## 2018-12-21 DIAGNOSIS — Z86711 Personal history of pulmonary embolism: Secondary | ICD-10-CM | POA: Diagnosis not present

## 2018-12-22 ENCOUNTER — Telehealth: Payer: Self-pay | Admitting: *Deleted

## 2018-12-22 NOTE — Telephone Encounter (Signed)
   [facility] Pre-operative Risk Assessment    Request for surgical clearance:  1. What type of surgery is being performed? COLONOSCOPY   2. When is this surgery scheduled? 01/25/19   3. What type of clearance is required (medical clearance vs. Pharmacy clearance to hold med vs. Both)? BOTH  4. Are there any medications that need to be held prior to surgery and how long?ELIQUIS   5. Practice name and name of physician performing surgery? [facility]; DR. Alessandra Bevels   6. What is your office phone number [phone]    7.   What is your office fax number [phone]  8.   Anesthesia type (None, local, MAC, general) ? PROPOFOL   Julaine Hua 12/22/2018, 9:49 AM  _________________________________________________________________   (provider comments below)

## 2018-12-22 NOTE — Telephone Encounter (Signed)
Pt takes Eliquis for PE in 2016, also with history of LV apical thrombus after STEMI in 2012 (treated with short term anticoagulation, since resolved). Renal function is normal. Recommend holding Eliquis for 24 hours prior to colonoscopy.

## 2018-12-23 NOTE — Telephone Encounter (Signed)
LV to call back

## 2018-12-27 NOTE — Telephone Encounter (Signed)
New Message ° ° ° °Patient returning phone call  °

## 2019-01-02 NOTE — Telephone Encounter (Signed)
   Primary Cardiologist: Verne Carrow, MD  Chart reviewed as part of pre-operative protocol coverage. Patient was contacted 01/02/2019 in reference to pre-operative risk assessment for pending surgery as outlined below.  Rodney Marquez was last seen on 08/29/2018 by Dr. [name].  Since that day, Rodney Marquez has done well without any chest pain or shortness of breath. .  Therefore, based on ACC/AHA guidelines, the patient would be at acceptable risk for the planned procedure without further cardiovascular testing.   I will route this recommendation to the requesting party via Epic fax function and remove from pre-op pool.  Please call with questions.  Per our clinical pharmacist, "Recommend holding Eliquis for 24 hours prior to colonoscopy." I have informed the patient this recommendation.  Clint, [place] 01/02/2019, 10:11 AM

## 2019-01-04 ENCOUNTER — Other Ambulatory Visit: Payer: Self-pay | Admitting: Cardiovascular Disease

## 2019-01-25 DIAGNOSIS — K6389 Other specified diseases of intestine: Secondary | ICD-10-CM | POA: Diagnosis not present

## 2019-01-25 DIAGNOSIS — K648 Other hemorrhoids: Secondary | ICD-10-CM | POA: Diagnosis not present

## 2019-01-25 DIAGNOSIS — Z1211 Encounter for screening for malignant neoplasm of colon: Secondary | ICD-10-CM | POA: Diagnosis not present

## 2019-01-25 DIAGNOSIS — K5730 Diverticulosis of large intestine without perforation or abscess without bleeding: Secondary | ICD-10-CM | POA: Diagnosis not present

## 2019-02-17 ENCOUNTER — Encounter: Payer: Self-pay | Admitting: Cardiology

## 2019-02-22 ENCOUNTER — Telehealth: Payer: Self-pay

## 2019-02-22 NOTE — Telephone Encounter (Signed)
lpmtcb 4/1 

## 2019-02-23 ENCOUNTER — Telehealth: Payer: Self-pay

## 2019-02-23 NOTE — Telephone Encounter (Signed)
   Cardiac Questionnaire:    Since your last visit or hospitalization:    1. Have you been having new or worsening chest pain? no   2. Have you been having new or worsening shortness of breath? no 3. Have you been having new or worsening leg swelling, wt gain, or increase in abdominal girth (pants fitting more tightly)? no   4. Have you had any passing out spells? no    *A YES to any of these questions would result in the appointment being kept. *If all the answers to these questions are NO, we should indicate that given the current situation regarding the worldwide coronarvirus pandemic, at the recommendation of the CDC, we are looking to limit gatherings in our waiting area, and thus will reschedule their appointment beyond four weeks from today.   _____________   The patient has consented to a Virtual Visit by Phone scheduled for 4/8 @ 3:30 pm

## 2019-02-23 NOTE — Telephone Encounter (Signed)
lpmtcb 4/2 

## 2019-03-01 ENCOUNTER — Other Ambulatory Visit: Payer: Self-pay

## 2019-03-01 ENCOUNTER — Encounter: Payer: Self-pay | Admitting: Cardiology

## 2019-03-01 ENCOUNTER — Telehealth (INDEPENDENT_AMBULATORY_CARE_PROVIDER_SITE_OTHER): Payer: BLUE CROSS/BLUE SHIELD | Admitting: Cardiology

## 2019-03-01 VITALS — BP 135/89 | HR 69 | Ht 67.0 in | Wt 182.0 lb

## 2019-03-01 DIAGNOSIS — I2510 Atherosclerotic heart disease of native coronary artery without angina pectoris: Secondary | ICD-10-CM | POA: Diagnosis not present

## 2019-03-01 DIAGNOSIS — I5042 Chronic combined systolic (congestive) and diastolic (congestive) heart failure: Secondary | ICD-10-CM | POA: Diagnosis not present

## 2019-03-01 MED ORDER — NITROGLYCERIN 0.4 MG SL SUBL: 0.4000 mg | SUBLINGUAL_TABLET | SUBLINGUAL | 3 refills | Status: DC | PRN

## 2019-03-01 NOTE — Progress Notes (Signed)
Virtual Visit via Telephone Note   This visit type was conducted due to national recommendations for restrictions regarding the COVID-19 Pandemic (e.g. social distancing) in an effort to limit this patient's exposure and mitigate transmission in our community.  Due to his co-morbid illnesses, this patient is at least at moderate risk for complications without adequate follow up.  This format is felt to be most appropriate for this patient at this time.  The patient did not have access to video technology/had technical difficulties with video requiring transitioning to audio format only (telephone).  All issues noted in this document were discussed and addressed.  No physical exam could be performed with this format.  Please refer to the patient's chart for his  consent to telehealth for [facility].   Evaluation Performed:  Follow-up visit  Date:  03/01/2019   ID:  Rodney Marquez, DOB [id number], MRN [id number]  Patient Location: Home  Provider Location: Home  PCP:  Georgann Housekeeper, MD  Cardiologist:  Verne Carrow, MD  Electrophysiologist:  None   Chief Complaint:  6 month f/u for CAD  History of Present Illness:    Rodney Marquez is a 53 y.o. male who presents via audio/video conferencing for a telehealth visit today.   He is a 53 yo AA male with history of CAD, HTN, HLD, SVT, ischemic cardiomyopathy, PE, PVCs, PACs, chronic combined CHF and LV apical thrombus. He is followed by Dr. [name].   He had an anterior STEMI in November 2012 secondary to an occluded LAD that was treated with a bare metal stent. He was treated with short term anti-coagulation for LV apical thrombus. LV function normalized on echo in 2013 and his anti-coagulation was stopped. He was seen in the [facility] ED with weakness 04/02/15 and had negative head MRI. Labs ok. Per pt primary care, he was found to have a PE on CTA and started Eliquis. Echo in 2016 with apical HK reported with possible LV apical thrombus.  Dr. [name] saw him 04/12/15 and he was still feeling poorly. He arranged a stress myoview and 48 hour monitor. He did not complete these tests. He was admitted to [facility] 04/27/15 with fatigue and had an inpatient stress myoview with no ischemia. He did not tolerate Metoprolol but has tolerated Coreg. 48 hour monitor June 2016 with 12 beat run NSVT, rare PVCs, rare PACs. Echo December 2017 with LVEF=45-50%, apical dyskinesis with no evidence of thrombus, grade 1 diastolic dysfunction.   Last seen by Dr. [name] 08/2018 and was doing well w/o cardiac symptoms.  He denied chest pain.  From a heart failure standpoint, he was felt to be euvolemic and denied any symptoms of dyspnea or fatigue.  Dr. [name] elected to continue him on his heart failure regimen including his ARB but  noted to consider later transition to [facility] if he were to ever to develop symptoms (NYHA class 2 or higher).  In regards to LV thrombus, Dr. [name] also noted "Resolved by echo in 2017 but given continue apical hypokinesis, I would continue anti-coagulation long term as it is clear now that his apex has scar tissue and is high risk for thrombus formation".  Eliquis was therefore continued.  Pt reports that they have done well from a symptom standpoint since their last office visit. They deny chest pain and dyspnea. No exertional symptoms w/ exercise or ADLs. Pt also denies orthopnea, PND, LEE, palpitations, dizziness, syncope/ near syncope and claudication.   They report full medication compliance. No  intolerances or medication side effects. No abnormal bleeding w/ Eliquis. Pt also denies any hospitalizations,  ED/urgent care visits or surgeries since their last office visit.     The patient does not have symptoms concerning for COVID-19 infection (fever, chills, cough, or new shortness of breath).    Past Medical History:  Diagnosis Date  . Cardiomyopathy, ischemic 09/2011  . Chronic combined systolic and diastolic CHF  (congestive heart failure) (HCC)   . Coronary artery disease    late presentation with ant STEMI 11/12: LHC 10/12/11: oLM 20%, LAD occluded with dissection 2/2 MI.  PCI with BMS to LAD with poor distal runoff; small caliber apical vessel c/w edematous apex from delayed presentation.  . Elevated LFTs    Abdominal ultrasound was unremarkable  . Family history of early CAD    Brother had MI at age 58 yo.  [name] HLD (hyperlipidemia)   . HTN (hypertension)   . 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 After STEMI 2012, also noted on CT in 2016. b. 2D echo 2017: no thrombus noted.  [name] NSVT (nonsustained ventricular tachycardia) (HCC)   . PE (pulmonary embolism) 03/2015   a. CTA 03/2015: very small amount of nonocclusive distal segmental/proximal subsegmental left lower lobe embolus as well as myocardial thinning of the left ventricular apex consistent with prior infarction; Adjacent filling defect may reflect thrombus in the left ventricular apex.   . Premature atrial contractions   . PSVT (paroxysmal supraventricular tachycardia) (HCC)    s/p adenocard 12 mg IVP 10/2011  . PVC's (premature ventricular contractions)   . ST elevation myocardial infarction (STEMI) of anterior wall (HCC) 09/2011   a. 09/2011 s/p BMS to occluded LAD.   Past Surgical History:  Procedure Laterality Date  . CORONARY ANGIOGRAM N/A 10/12/2011   Procedure: CORONARY ANGIOGRAM;  Surgeon: Herby Abraham, MD;  Location: [facility] CATH LAB;  Service: Cardiovascular;  Laterality: N/A;  . LEG SURGERY     cyst removal  . PERCUTANEOUS STENT INTERVENTION N/A 10/12/2011   Procedure: PERCUTANEOUS STENT INTERVENTION;  Surgeon: Herby Abraham, MD;  Location[facility] CATH LAB;  Service: Cardiovascular;  Laterality: N/A;  . WRIST SURGERY     cyst removal      Current Meds  Medication Sig  . acetaminophen (TYLENOL) 325 MG tablet Take 650 mg by mouth every 6 (six) hours as needed for mild pain.  [name] atorvastatin  (LIPITOR) 40 MG tablet TAKE 1 TABLET EVERY EVENING  . carvedilol (COREG) 12.5 MG tablet Take 1 tablet (12.5 mg total) by mouth 2 (two) times daily with a meal.  . clonazePAM (KLONOPIN) 1 MG tablet Take 1 mg by mouth daily.  [name] ELIQUIS 5 MG TABS tablet Take 1 tablet by mouth 2 (two) times daily.  [name] losartan (COZAAR) 100 MG tablet TAKE 1 TABLET BY MOUTH EVERY DAY  . nitroGLYCERIN (NITROSTAT) 0.4 MG SL tablet Place 1 tablet (0.4 mg total) under the tongue every 5 (five) minutes as needed for chest pain.  [name] sertraline (ZOLOFT) 50 MG tablet Take 0.5 tablets (25 mg total) by mouth daily.  [name] spironolactone (ALDACTONE) 25 MG tablet TAKE 1 TABLET BY MOUTH EVERY DAY     Allergies:   Lisinopril   Social History   Tobacco Use  . Smoking status: Never Smoker  . Smokeless tobacco: Never Used  Substance Use Topics  . Alcohol use: Yes    Alcohol/week: 1.0 standard drinks    Types: 1 Cans of beer per  week    Comment: socially  . Drug use: No     Family Hx: The patient's family history includes Coronary artery disease in his brother; Hypertension in his brother, brother, and mother.  ROS:   Please see the history of present illness.     All other systems reviewed and are negative.   Prior CV studies:   The following studies were reviewed today:  2D echocardiogram 2017 Study Conclusions  - Left ventricle: The cavity size was normal. Wall thickness was   normal. Systolic function was mildly reduced. The estimated   ejection fraction was in the range of 45% to 50%. There is   dyskinesis of the apical myocardium; consistent with infarction   in the distribution of the left anterior descending coronary   artery. Doppler parameters are consistent with abnormal left   ventricular relaxation (grade 1 diastolic dysfunction). Acoustic   contrast opacification revealed no evidence ofthrombus.  Labs/Other Tests and Data Reviewed:    EKG:  An ECG dated 01/13/18 was personally reviewed today and  demonstrated:  NSR, inferior Qwaves  Recent Labs: No results found for requested labs within last 8760 hours.   Recent Lipid Panel Lab Results  Component Value Date/Time   CHOL 115 01/17/2018 04:27 PM   TRIG 51 01/17/2018 04:27 PM   HDL 40 01/17/2018 04:27 PM   CHOLHDL 2.9 01/17/2018 04:27 PM   CHOLHDL 4.1 10/30/2016 10:18 AM   LDLCALC 65 01/17/2018 04:27 PM    Wt Readings from Last 3 Encounters:  03/01/19 182 lb (82.6 kg)  08/29/18 179 lb 1.9 oz (81.2 kg)  01/13/18 178 lb 6.4 oz (80.9 kg)     Objective:    Vital Signs:  Ht 5\' 7"  (1.702 m)   Wt 182 lb (82.6 kg)   BMI 28.51 kg/m   Well sounding male in no acute distress Speaking in clear complete sentences. Speech unlabored.  Breathing unlabored   ASSESSMENT & PLAN:    1. CAD without angina:  Status post anterior STEMI in 2012 treated with bare-metal stent to the LAD.  Stress test in 2016 showed no ischemia. He denies anginal symptoms. Continue medical therapy w/ statin and  blocker. No ASA due to anticoagulation therapy w/ Eliquis.   2. HTN: BP is controlled on current regimen. Continue current medications. No changes made today.  3. LV apical thrombus: Resolved by echo in 2017 but given continued apical hypokinesis, plan is to continue anti-coagulation long term as it is clear now that his apex has scar tissue and is high risk for thrombus formation. Continue Eliquis. he denies abnormal bleeding. PCP follows labs. Has return visit in May.   4. Ischemic cardiomyopathy/Chronic combined systolic and diastolic CHF:  Last echocardiogram in 2017 showed an EF of 45 to 50%. He has been treated with guideline directed medical therapy including beta blocker, ARB and aldactone. He remains NYHA class 1. No exertional symptoms with moderate activity.  Does not meet guidelines for Entresto. Continue Losartan, Coreg and spiro. PCP follows labs. Has return visit next month.   5. HLD: On statin therapy with LDL goal of less than 70  given known CAD.  Last fasting lipid panel was February 2019.  LDL was controlled at that time at 65 mg/dL.  He is past due for repeat fasting lipid panel.  I recommended repeat FLP in the near future. He states his PCP follows and he has return visit next month.   COVID-19 Education: The signs and symptoms of COVID-19  were discussed with the patient and how to seek care for testing (follow up with PCP or arrange E-visit).  The importance of social distancing was discussed today.  Time:   Today, I have spent 20 minutes with the patient with telehealth technology discussing the above problems.     Medication Adjustments/Labs and Tests Ordered: Current medicines are reviewed at length with the patient today.  Concerns regarding medicines are outlined above.  Tests Ordered: No orders of the defined types were placed in this encounter.  Medication Changes: No orders of the defined types were placed in this encounter.   Disposition:  Follow up w/ Dr. [name] in 6 months.   Signed, Robbie Lis, PA-C  03/01/2019 4:09 PM    [facility]

## 2019-03-01 NOTE — Patient Instructions (Addendum)
  Medication Instructions: Refilled NTG at pharmacy none If you need a refill on your cardiac medications before your next appointment, please call your pharmacy.   Lab work: To be done by Primary Care Physician.  Please have them fax to Dr [name] [phone]  If you have labs (blood work) drawn today and your tests are completely normal, you will receive your results only by: [name] MyChart Message (if you have MyChart) OR . A paper copy in the mail If you have any lab test that is abnormal or we need to change your treatment, we will call you to review the results.  Testing/Procedures: none  Follow-Up: 6 months with Dr [name] At [facility], you and your health needs are our priority.  As part of our continuing mission to provide you with exceptional heart care, we have created designated Provider Care Teams.  These Care Teams include your primary Cardiologist (physician) and Advanced Practice Providers (APPs -  Physician Assistants and Nurse Practitioners) who all work together to provide you with the care you need, when you need it.  Any Other Special Instructions Will Be Listed Below (If Applicable).

## 2019-05-23 DIAGNOSIS — Z0000 Encounter for general adult medical examination without abnormal findings: Secondary | ICD-10-CM | POA: Diagnosis not present

## 2019-05-23 DIAGNOSIS — E782 Mixed hyperlipidemia: Secondary | ICD-10-CM | POA: Diagnosis not present

## 2019-05-23 DIAGNOSIS — R7303 Prediabetes: Secondary | ICD-10-CM | POA: Diagnosis not present

## 2019-05-23 DIAGNOSIS — I10 Essential (primary) hypertension: Secondary | ICD-10-CM | POA: Diagnosis not present

## 2019-05-23 DIAGNOSIS — Z1389 Encounter for screening for other disorder: Secondary | ICD-10-CM | POA: Diagnosis not present

## 2019-10-27 ENCOUNTER — Other Ambulatory Visit: Payer: Self-pay

## 2019-10-27 DIAGNOSIS — Z20822 Contact with and (suspected) exposure to covid-19: Secondary | ICD-10-CM

## 2019-10-30 DIAGNOSIS — I252 Old myocardial infarction: Secondary | ICD-10-CM | POA: Diagnosis not present

## 2019-10-30 DIAGNOSIS — I10 Essential (primary) hypertension: Secondary | ICD-10-CM | POA: Diagnosis not present

## 2019-10-30 DIAGNOSIS — I2510 Atherosclerotic heart disease of native coronary artery without angina pectoris: Secondary | ICD-10-CM | POA: Diagnosis not present

## 2019-10-30 DIAGNOSIS — Z23 Encounter for immunization: Secondary | ICD-10-CM | POA: Diagnosis not present

## 2019-10-30 DIAGNOSIS — E782 Mixed hyperlipidemia: Secondary | ICD-10-CM | POA: Diagnosis not present

## 2019-10-30 DIAGNOSIS — R7303 Prediabetes: Secondary | ICD-10-CM | POA: Diagnosis not present

## 2019-10-30 LAB — NOVEL CORONAVIRUS, NAA: SARS-CoV-2, NAA: NOT DETECTED

## 2019-12-30 ENCOUNTER — Other Ambulatory Visit: Payer: Self-pay | Admitting: Cardiovascular Disease

## 2020-05-06 DIAGNOSIS — R7303 Prediabetes: Secondary | ICD-10-CM | POA: Diagnosis not present

## 2020-05-06 DIAGNOSIS — I10 Essential (primary) hypertension: Secondary | ICD-10-CM | POA: Diagnosis not present

## 2020-05-06 DIAGNOSIS — I2510 Atherosclerotic heart disease of native coronary artery without angina pectoris: Secondary | ICD-10-CM | POA: Diagnosis not present

## 2020-05-06 DIAGNOSIS — Z1389 Encounter for screening for other disorder: Secondary | ICD-10-CM | POA: Diagnosis not present

## 2020-05-06 DIAGNOSIS — Z125 Encounter for screening for malignant neoplasm of prostate: Secondary | ICD-10-CM | POA: Diagnosis not present

## 2020-05-06 DIAGNOSIS — Z0000 Encounter for general adult medical examination without abnormal findings: Secondary | ICD-10-CM | POA: Diagnosis not present

## 2020-05-06 DIAGNOSIS — E782 Mixed hyperlipidemia: Secondary | ICD-10-CM | POA: Diagnosis not present

## 2020-08-01 NOTE — Progress Notes (Deleted)
No chief complaint on file.   History of Present Illness: 54 yo male with history of CAD, HTN, HLD, SVT, ischemic cardiomyopathy, PE, PVCs, PACs, chronic combined CHF and LV apical thrombus who is here today for cardiac follow up. He had an anterior STEMI in November 2012 secondary to an occluded LAD that was treated with a bare metal stent. He was treated with short term anti-coagulation for LV apical thrombus. LV function normalized on echo in 2013 and his anti-coagulation was stopped. He was started on Eliquis in 2016 for a PE. Echo in 2016 with apical HK reported with possible LV apical thrombus. He was admitted to [facility] 04/27/15 with fatigue and had an inpatient stress myoview with no ischemia. He did not tolerate Metoprolol but has tolerated Coreg. 48 hour monitor June 2016 with 12 beat run NSVT, rare PVCs, rare PACs. Echo December 2017 with LVEF=45-50%, apical dyskinesis with no evidence of thrombus, grade 1 diastolic dysfunction.   He is here today for follow up. The patient denies any chest pain, dyspnea, palpitations, lower extremity edema, orthopnea, PND, dizziness, near syncope or syncope.   Primary Care Physician:  Georgann Housekeeper, MD  Past Medical History:  Diagnosis Date  . Cardiomyopathy, ischemic 09/2011  . Chronic combined systolic and diastolic CHF (congestive heart failure) (HCC)   . Coronary artery disease    late presentation with ant STEMI 11/12: LHC 10/12/11: oLM 20%, LAD occluded with dissection 2/2 MI.  PCI with BMS to LAD with poor distal runoff; small caliber apical vessel c/w edematous apex from delayed presentation.  . Elevated LFTs    Abdominal ultrasound was unremarkable  . Family history of early CAD    Brother had MI at age 56 yo.  [name] HLD (hyperlipidemia)   . HTN (hypertension)   . 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 After STEMI 2012, also noted on CT in 2016. b. 2D echo 2017: no thrombus noted.  [name] NSVT (nonsustained  ventricular tachycardia) (HCC)   . PE (pulmonary embolism) 03/2015   a. CTA 03/2015: very small amount of nonocclusive distal segmental/proximal subsegmental left lower lobe embolus as well as myocardial thinning of the left ventricular apex consistent with prior infarction; Adjacent filling defect may reflect thrombus in the left ventricular apex.   . Premature atrial contractions   . PSVT (paroxysmal supraventricular tachycardia) (HCC)    s/p adenocard 12 mg IVP 10/2011  . PVC's (premature ventricular contractions)   . ST elevation myocardial infarction (STEMI) of anterior wall (HCC) 09/2011   a. 09/2011 s/p BMS to occluded LAD.    Past Surgical History:  Procedure Laterality Date  . CORONARY ANGIOGRAM N/A 10/12/2011   Procedure: CORONARY ANGIOGRAM;  Surgeon: Herby Abraham, MD;  Location: [facility] CATH LAB;  Service: Cardiovascular;  Laterality: N/A;  . LEG SURGERY     cyst removal  . PERCUTANEOUS STENT INTERVENTION N/A 10/12/2011   Procedure: PERCUTANEOUS STENT INTERVENTION;  Surgeon: Herby Abraham, MD;  Location: [facility] CATH LAB;  Service: Cardiovascular;  Laterality: N/A;  . WRIST SURGERY     cyst removal     Current Outpatient Medications  Medication Sig Dispense Refill  . acetaminophen (TYLENOL) 325 MG tablet Take 650 mg by mouth every 6 (six) hours as needed for mild pain.    [name] atorvastatin (LIPITOR) 40 MG tablet TAKE 1 TABLET EVERY EVENING 30 tablet 1  . carvedilol (COREG) 12.5 MG tablet Take 1 tablet (12.5 mg total) by mouth 2 (  two) times daily with a meal. 180 tablet 3  . clonazePAM (KLONOPIN) 1 MG tablet Take 1 mg by mouth daily.  2  . ELIQUIS 5 MG TABS tablet Take 1 tablet by mouth 2 (two) times daily.    [name] losartan (COZAAR) 100 MG tablet TAKE 1 TABLET BY MOUTH EVERY DAY 30 tablet 11  . nitroGLYCERIN (NITROSTAT) 0.4 MG SL tablet Place 1 tablet (0.4 mg total) under the tongue every 5 (five) minutes as needed for chest pain. 25 tablet 3  . sertraline (ZOLOFT) 50 MG tablet Take  0.5 tablets (25 mg total) by mouth daily.    [name] spironolactone (ALDACTONE) 25 MG tablet TAKE 1 TABLET BY MOUTH EVERY DAY 90 tablet 2   No current facility-administered medications for this visit.    Allergies  Allergen Reactions  . Lisinopril Cough    Social History   Socioeconomic History  . Marital status: Single    Spouse name: Not on file  . Number of children: 0  . Years of education: Not on file  . Highest education level: Not on file  Occupational History  . Not on file  Tobacco Use  . Smoking status: Never Smoker  . Smokeless tobacco: Never Used  Vaping Use  . Vaping Use: Never used  Substance and Sexual Activity  . Alcohol use: Yes    Alcohol/week: 1.0 standard drink    Types: 1 Cans of beer per week    Comment: socially  . Drug use: No  . Sexual activity: Yes    Birth control/protection: None  Other Topics Concern  . Not on file  Social History Narrative  . Not on file   Social Determinants of Health   Financial Resource Strain:   . Difficulty of Paying Living Expenses: Not on file  Food Insecurity:   . Worried About Programme researcher, broadcasting/film/video in the Last Year: Not on file  . Ran Out of Food in the Last Year: Not on file  Transportation Needs:   . Lack of Transportation (Medical): Not on file  . Lack of Transportation (Non-Medical): Not on file  Physical Activity:   . Days of Exercise per Week: Not on file  . Minutes of Exercise per Session: Not on file  Stress:   . Feeling of Stress : Not on file  Social Connections:   . Frequency of Communication with Friends and Family: Not on file  . Frequency of Social Gatherings with Friends and Family: Not on file  . Attends Religious Services: Not on file  . Active Member of Clubs or Organizations: Not on file  . Attends Banker Meetings: Not on file  . Marital Status: Not on file  Intimate Partner Violence:   . Fear of Current or Ex-Partner: Not on file  . Emotionally Abused: Not on file  .  Physically Abused: Not on file  . Sexually Abused: Not on file    Family History  Problem Relation Age of Onset  . Hypertension Mother   . Hypertension Brother   . Hypertension Brother   . Coronary artery disease Brother        Had MI in his 23 yo    Review of Systems:  As stated in the HPI and otherwise negative.   There were no vitals taken for this visit.  Physical Examination:  General: Well developed, well nourished, NAD  HEENT: OP clear, mucus membranes moist  SKIN: warm, dry. No rashes. Neuro: No focal deficits  Musculoskeletal: Muscle  strength 5/5 all ext  Psychiatric: Mood and affect normal  Neck: No JVD, no carotid bruits, no thyromegaly, no lymphadenopathy.  Lungs:Clear bilaterally, no wheezes, rhonci, crackles Cardiovascular: Regular rate and rhythm. No murmurs, gallops or rubs. Abdomen:Soft. Bowel sounds present. Non-tender.  Extremities: No lower extremity edema. Pulses are 2 + in the bilateral DP/PT.  EKG:  EKG is *** ordered today. The ekg ordered today demonstrates   Recent Labs: No results found for requested labs within last 8760 hours.   Lipid Panel    Component Value Date/Time   CHOL 115 01/17/2018 1627   TRIG 51 01/17/2018 1627   HDL 40 01/17/2018 1627   CHOLHDL 2.9 01/17/2018 1627   CHOLHDL 4.1 10/30/2016 1018   VLDL 20 10/30/2016 1018   LDLCALC 65 01/17/2018 1627     Wt Readings from Last 3 Encounters:  03/01/19 182 lb (82.6 kg)  08/29/18 179 lb 1.9 oz (81.2 kg)  01/13/18 178 lb 6.4 oz (80.9 kg)     Other studies Reviewed: Additional studies/ records that were reviewed today include: . Review of the above records demonstrates:   Assessment and Plan:   1. CAD without angina: He has no chest pain. He is not on ASA since he is on Eliquis chronically. Will continue beta blocker and statin.  2. HTN: BP is well controlled. Continue current therapy  3. LV apical thrombus: Resolved by echo in 2017 but given continue apical hypokinesis,  I would continue anti-coagulation long term as it is clear now that his apex has scar tissue and is high risk for thrombus formation. Will continue Eliquis.   4. Ischemic cardiomyopathy/Chronic combined systolic and diastolic CHF: No volume overload on exam. Continue beta blocker, ARB and aldactone.   5. HLD: Lipids controlled in 2019. Will continue statin. *** Check lipids and LFTS now.   Current medicines are reviewed at length with the patient today.  The patient does not have concerns regarding medicines.  The following changes have been made:  no change  Labs/ tests ordered today include:  No orders of the defined types were placed in this encounter.  Disposition:   FU with me in  months  Signed, Verne Carrow, MD 08/01/2020 8:26 AM    [facility] [address] [place], [place], [place]  [address] Phone: [phone]; Fax: [phone]

## 2020-08-02 ENCOUNTER — Ambulatory Visit: Payer: BC Managed Care – PPO | Admitting: Cardiovascular Disease

## 2020-10-29 ENCOUNTER — Other Ambulatory Visit: Payer: Self-pay | Admitting: Cardiovascular Disease

## 2020-10-31 ENCOUNTER — Other Ambulatory Visit: Payer: Self-pay

## 2020-10-31 ENCOUNTER — Encounter (HOSPITAL_COMMUNITY): Payer: Self-pay | Admitting: *Deleted

## 2020-10-31 ENCOUNTER — Emergency Department (HOSPITAL_COMMUNITY)
Admission: EM | Admit: 2020-10-31 | Discharge: 2020-10-31 | Disposition: A | Discharge status: Home / Self Care | Payer: BC Managed Care – PPO | Attending: Emergency Medicine | Admitting: Emergency Medicine

## 2020-10-31 DIAGNOSIS — I2510 Atherosclerotic heart disease of native coronary artery without angina pectoris: Secondary | ICD-10-CM | POA: Diagnosis not present

## 2020-10-31 DIAGNOSIS — G479 Sleep disorder, unspecified: Secondary | ICD-10-CM | POA: Diagnosis not present

## 2020-10-31 DIAGNOSIS — G4700 Insomnia, unspecified: Secondary | ICD-10-CM | POA: Insufficient documentation

## 2020-10-31 DIAGNOSIS — R109 Unspecified abdominal pain: Secondary | ICD-10-CM | POA: Insufficient documentation

## 2020-10-31 DIAGNOSIS — Z79899 Other long term (current) drug therapy: Secondary | ICD-10-CM | POA: Diagnosis not present

## 2020-10-31 DIAGNOSIS — Z7901 Long term (current) use of anticoagulants: Secondary | ICD-10-CM | POA: Diagnosis not present

## 2020-10-31 DIAGNOSIS — I110 Hypertensive heart disease with heart failure: Secondary | ICD-10-CM | POA: Insufficient documentation

## 2020-10-31 DIAGNOSIS — I5042 Chronic combined systolic (congestive) and diastolic (congestive) heart failure: Secondary | ICD-10-CM | POA: Insufficient documentation

## 2020-10-31 DIAGNOSIS — R630 Anorexia: Secondary | ICD-10-CM | POA: Diagnosis not present

## 2020-10-31 MED ORDER — HYDROXYZINE HCL 25 MG PO TABS: 25.0000 mg | ORAL_TABLET | Freq: Four times a day (QID) | ORAL | 0 refills | Status: AC

## 2020-10-31 NOTE — ED Notes (Signed)
Patient verbalizes understanding of discharge instructions. Opportunity for questioning and answers were provided. Armband removed by staff, pt discharged from ED and ambulated to lobby to return home.   

## 2020-10-31 NOTE — Discharge Instructions (Addendum)
You were seen in the ER for difficulty sleeping and lack of appetite  No need for emergent lab work or imaging today  I suspect your insomnia is due to years of working night shifts, recent cessation of clonazepam.  There could also be some underlying anxiety, restlessness.  Ensure you are avoiding caffeine as much as possible. This includes sodas, coffee drinks. Avoid TV, cell phone, music 1-2 hours before bedtime. Try to read something before going to sleep.   Continue using Zombien as prescribed.  Take hydroxyzine 25 mg every 6 hours during the day, and 50 mg before bed time  Go to your appointment with Dr Eula Listen on Friday

## 2020-10-31 NOTE — ED Triage Notes (Signed)
The pt reports that he cannot sleep ansd he has not had an appetite for 4 days   He has had  The covid vaccines

## 2020-10-31 NOTE — ED Provider Notes (Signed)
[facility] EMERGENCY DEPARTMENT Provider Note   CSN: [id number] Arrival date & time: 10/31/20  0244     History Chief Complaint  Patient presents with  . Insomnia    Rodney Marquez is a 54 y.o. male with past medical history below presents to the ED for difficulty sleeping since Saturday.  States he has not slept in the last 48 hours.  Also reports decreased appetite, states he just does not have hunger.  Has lost 5 pounds since Saturday.  Is able to tolerate drinks and food but just doesn't want to eat.  No associated pain when he eats.  Reports having some pain in the left abdomen a while ago but his primary care doctor did an ultrasound and this has resolved.  Denies fevers, chills, nausea, vomiting, changes in his bowel movement.  No myalgias.  States his primary care doctor Dr. Eula Listen used to give him monthly refills for clonazepam and states last time he asked for refill he did not refill it.  States he felt "panicky". He called Dr. Eula Listen yesterday and received a prescription for his Ambien.  Patient states he tried it once last night and could not sleep so he came to the ED.  Denies underlying issues with anxiety or depression in the past.  No issues with thyroid. States he does not feel particularly anxious.  Has had difficulty with sleeping occasionally throughout the years.  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  He drinks 1 soda and 1 caffeine mocha daily.  No tobacco use.  No alcohol use.  Has tried melatonin as well without relief. Has appointment with Dr Eula Listen tomorrow.   HPI     Past Medical History:  Diagnosis Date  . Cardiomyopathy, ischemic 09/2011  . Chronic combined systolic and diastolic CHF (congestive heart failure) (HCC)   . Coronary artery disease    late presentation with ant STEMI 11/12: LHC 10/12/11: oLM 20%, LAD occluded with dissection 2/2 MI.  PCI with BMS to LAD with poor distal runoff; small  caliber apical vessel c/w edematous apex from delayed presentation.  . Elevated LFTs    Abdominal ultrasound was unremarkable  . Family history of early CAD    Brother had MI at age 88 yo.  [name] HLD (hyperlipidemia)   . HTN (hypertension)   . 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 After STEMI 2012, also noted on CT in 2016. b. 2D echo 2017: no thrombus noted.  [name] NSVT (nonsustained ventricular tachycardia) (HCC)   . PE (pulmonary embolism) 03/2015   a. CTA 03/2015: very small amount of nonocclusive distal segmental/proximal subsegmental left lower lobe embolus as well as myocardial thinning of the left ventricular apex consistent with prior infarction; Adjacent filling defect may reflect thrombus in the left ventricular apex.   . Premature atrial contractions   . PSVT (paroxysmal supraventricular tachycardia) (HCC)    s/p adenocard 12 mg IVP 10/2011  . PVC's (premature ventricular contractions)   . ST elevation myocardial infarction (STEMI) of anterior wall (HCC) 09/2011   a. 09/2011 s/p BMS to occluded LAD.    Patient Active Problem List   Diagnosis Date Noted  . Chronic combined systolic and diastolic CHF (congestive heart failure) (HCC) 10/29/2016  . Chest pain 04/27/2015  . Atypical chest pain 01/18/2012  . Insomnia 11/04/2011  . CAD S/P percutaneous coronary angioplasty 11/04/2011  . SVT (supraventricular tachycardia) (HCC) 10/31/2011  .  Long term (current) use of anticoagulants 10/23/2011  . LV (left ventricular) mural thrombus following MI (HCC) 10/20/2011  . Hypertension 10/14/2011  . ST elevation myocardial infarction (STEMI) of anterior wall (HCC) 10/14/2011  . HLD (hyperlipidemia)   . Cardiomyopathy, ischemic 09/24/2011    Past Surgical History:  Procedure Laterality Date  . CORONARY ANGIOGRAM N/A 10/12/2011   Procedure: CORONARY ANGIOGRAM;  Surgeon: Herby Abraham, MD;  Location: [facility] CATH LAB;  Service: Cardiovascular;  Laterality: N/A;  .  LEG SURGERY     cyst removal  . PERCUTANEOUS STENT INTERVENTION N/A 10/12/2011   Procedure: PERCUTANEOUS STENT INTERVENTION;  Surgeon: Herby Abraham, MD;  Location: [facility] CATH LAB;  Service: Cardiovascular;  Laterality: N/A;  . WRIST SURGERY     cyst removal        Family History  Problem Relation Age of Onset  . Hypertension Mother   . Hypertension Brother   . Hypertension Brother   . Coronary artery disease Brother        Had MI in his 37 yo    Social History   Tobacco Use  . Smoking status: Never Smoker  . Smokeless tobacco: Never Used  Vaping Use  . Vaping Use: Never used  Substance Use Topics  . Alcohol use: Yes    Alcohol/week: 1.0 standard drink    Types: 1 Cans of beer per week    Comment: socially  . Drug use: No    Home Medications Prior to Admission medications   Medication Sig Start Date End Date Taking? Authorizing Provider  acetaminophen (TYLENOL) 325 MG tablet Take 650 mg by mouth every 6 (six) hours as needed for mild pain.   Yes [provider]  atorvastatin (LIPITOR) 40 MG tablet TAKE 1 TABLET EVERY EVENING Patient taking differently: Take 40 mg by mouth every evening. 10/29/20  Yes Kathleene Hazel, MD  carvedilol (COREG) 12.5 MG tablet Take 1 tablet (12.5 mg total) by mouth 2 (two) times daily with a meal. 10/30/16  Yes Dunn, Dayna N, PA-C  clonazePAM (KLONOPIN) 1 MG tablet Take 1 mg by mouth daily. 06/27/17  Yes [provider]  ELIQUIS 5 MG TABS tablet Take 5 mg by mouth 2 (two) times daily. 04/10/15  Yes [provider]  losartan (COZAAR) 100 MG tablet TAKE 1 TABLET BY MOUTH EVERY DAY Patient taking differently: Take 100 mg by mouth daily. 08/30/14  Yes Kathleene Hazel, MD  nitroGLYCERIN (NITROSTAT) 0.4 MG SL tablet Place 1 tablet (0.4 mg total) under the tongue every 5 (five) minutes as needed for chest pain. 03/01/19  Yes Robbie Lis M, PA-C  sertraline (ZOLOFT) 50 MG tablet Take 0.5 tablets (25 mg total)  by mouth daily. 04/28/15  Yes Robbie Lis M, PA-C  sildenafil (VIAGRA) 50 MG tablet Take 50 mg by mouth as needed for erectile dysfunction. 05/09/20  Yes [provider]  zolpidem (AMBIEN) 5 MG tablet Take 5 mg by mouth at bedtime as needed for sleep. 10/29/20  Yes [provider]  hydrOXYzine (ATARAX/VISTARIL) 25 MG tablet Take 1 tablet (25 mg total) by mouth every 6 (six) hours. 10/31/20   Liberty Handy, PA-C  spironolactone (ALDACTONE) 25 MG tablet TAKE 1 TABLET BY MOUTH EVERY DAY Patient not taking: Reported on 10/31/2020 11/08/17   Laurann Montana, PA-C    Allergies    Lisinopril  Review of Systems   Review of Systems  Constitutional: Positive for activity change.  Psychiatric/Behavioral: Positive for sleep disturbance.  All  other systems reviewed and are negative.   Physical Exam Updated Vital Signs BP 131/86 (BP Location: Right Arm)   Pulse 62   Temp 98.9 F (37.2 C) (Oral)   Resp 16   Ht 5\' 7"  (1.702 m)   Wt 83.9 kg   SpO2 99%   BMI 28.98 kg/m   Physical Exam Vitals and nursing note reviewed.  Constitutional:      General: He is not in acute distress.    Appearance: He is well-developed and well-nourished.  HENT:     Head: Normocephalic and atraumatic.     Right Ear: External ear normal.     Left Ear: External ear normal.     Nose: Nose normal.  Eyes:     Extraocular Movements: EOM normal.     Conjunctiva/sclera: Conjunctivae normal.  Cardiovascular:     Rate and Rhythm: Normal rate and regular rhythm.     Pulses: Intact distal pulses.     Heart sounds: Normal heart sounds. No murmur heard.   Pulmonary:     Effort: Pulmonary effort is normal.     Breath sounds: Normal breath sounds.  Musculoskeletal:        General: No deformity. Normal range of motion.     Cervical back: Normal range of motion and neck supple.  Skin:    General: Skin is warm and dry.     Capillary Refill: Capillary refill takes less than 2 seconds.   Neurological:     Mental Status: He is alert and oriented to person, place, and time.  Psychiatric:        Mood and Affect: Mood and affect normal.        Behavior: Behavior normal.        Thought Content: Thought content normal.        Judgment: Judgment normal.     ED Results / Procedures / Treatments   Labs (all labs ordered are listed, but only abnormal results are displayed) Labs Reviewed - No data to display  EKG None  Radiology No results found.  Procedures Procedures (including critical care time)  Medications Ordered in ED Medications - No data to display  ED Course  I have reviewed the triage vital signs and the nursing notes.  Pertinent labs & imaging results that were available during my care of the patient were reviewed by me and considered in my medical decision making (see chart for details).    MDM Rules/Calculators/A&P                          54 yo M here with insomnia, appetite changes.   EMR, triage and nursing notes reviewed   Seen in ER through the years 2015, 2016 for fatigue, insomnia. Narcotic database reviewed. Used to get 30 day prescription from Dr 2017 until last month.    Suspect chronic insomnia multifactorial likely from working nights for 13 years, poor sleep hygiene, possibly underlying anxiety, recent klonopin cessation.   Reasonable to avoid emergent lab work, imaging here.   Will trial hydroxyzine q6 hours and 1qhs.  Patient has appointment with Dr Eula Listen tomorrow. Discussed sleep hygiene, caffeine avoidance.   Final Clinical Impression(s) / ED Diagnoses Final diagnoses:  Insomnia, unspecified type  Lack of appetite    Rx / DC Orders ED Discharge Orders         Ordered    hydrOXYzine (ATARAX/VISTARIL) 25 MG tablet  Every 6 hours  10/31/20 0703           Liberty Handy, PA-C 10/31/20 0715    Sabas Sous, MD 10/31/20 1059

## 2020-11-01 DIAGNOSIS — E782 Mixed hyperlipidemia: Secondary | ICD-10-CM | POA: Diagnosis not present

## 2020-11-01 DIAGNOSIS — I2510 Atherosclerotic heart disease of native coronary artery without angina pectoris: Secondary | ICD-10-CM | POA: Diagnosis not present

## 2020-11-01 DIAGNOSIS — I10 Essential (primary) hypertension: Secondary | ICD-10-CM | POA: Diagnosis not present

## 2020-11-01 DIAGNOSIS — I252 Old myocardial infarction: Secondary | ICD-10-CM | POA: Diagnosis not present

## 2020-12-03 NOTE — Progress Notes (Deleted)
Cardiology Office Note    Date:  12/03/2020   ID:  Rodney Marquez, DOB 1966/04/26, MRN [id number]  PCP:  Georgann Housekeeper, MD  Cardiologist: Verne Carrow, MD EPS: None  No chief complaint on file.   History of Present Illness:  Rodney Marquez is a 55 y.o. male with history of CAD status post STEMI 09/2011 treated with BMS to the LAD, LV apical thrombus treated with anticoagulation.  This resolved by echo in 2017 but he had continued apical hypokinesis and Dr. [name] recommended long-term anticoagulation so he was continued on Eliquis.  Last echo 2017 EF 45 to 50% treated with beta-blocker ARB and Aldactone.  Patient last had telemedicine visit with Boyce Medici, PA-C 03/01/2019  Past Medical History:  Diagnosis Date   Cardiomyopathy, ischemic 09/2011   Chronic combined systolic and diastolic CHF (congestive heart failure) (HCC)    Coronary artery disease    late presentation with ant STEMI 11/12: LHC 10/12/11: oLM 20%, LAD occluded with dissection 2/2 MI.  PCI with BMS to LAD with poor distal runoff; small caliber apical vessel c/w edematous apex from delayed presentation.   Elevated LFTs    Abdominal ultrasound was unremarkable   Family history of early CAD    Brother had MI at age 6 yo.   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 After STEMI 2012, also noted on CT in 2016. b. 2D echo 2017: no thrombus noted.   NSVT (nonsustained ventricular tachycardia) (HCC)    PE (pulmonary embolism) 03/2015   a. CTA 03/2015: very small amount of nonocclusive distal segmental/proximal subsegmental left lower lobe embolus as well as myocardial thinning of the left ventricular apex consistent with prior infarction; Adjacent filling defect may reflect thrombus in the left ventricular apex.    Premature atrial contractions    PSVT (paroxysmal supraventricular tachycardia) (HCC)    s/p adenocard 12 mg  IVP 10/2011   PVC's (premature ventricular contractions)    ST elevation myocardial infarction (STEMI) of anterior wall (HCC) 09/2011   a. 09/2011 s/p BMS to occluded LAD.    Past Surgical History:  Procedure Laterality Date   CORONARY ANGIOGRAM N/A 10/12/2011   Procedure: CORONARY ANGIOGRAM;  Surgeon: Herby Abraham, MD;  Location: [facility] CATH LAB;  Service: Cardiovascular;  Laterality: N/A;   LEG SURGERY     cyst removal   PERCUTANEOUS STENT INTERVENTION N/A 10/12/2011   Procedure: PERCUTANEOUS STENT INTERVENTION;  Surgeon: Herby Abraham, MD;  Location: [facility] CATH LAB;  Service: Cardiovascular;  Laterality: N/A;   WRIST SURGERY     cyst removal     Current Medications: No outpatient medications have been marked as taking for the 12/10/20 encounter (Appointment) with Dyann Kief, PA-C.     Allergies:   Lisinopril   Social History   Socioeconomic History   Marital status: Single    Spouse name: Not on file   Number of children: 0   Years of education: Not on file   Highest education level: Not on file  Occupational History   Not on file  Tobacco Use   Smoking status: Never Smoker   Smokeless tobacco: Never Used  Vaping Use   Vaping Use: Never used  Substance and Sexual Activity   Alcohol use: Yes    Alcohol/week: 1.0 standard drink    Types: 1 Cans of beer per week    Comment: socially   Drug  use: No   Sexual activity: Yes    Birth control/protection: None  Other Topics Concern   Not on file  Social History Narrative   Not on file   Social Determinants of Health   Financial Resource Strain: Not on file  Food Insecurity: Not on file  Transportation Needs: Not on file  Physical Activity: Not on file  Stress: Not on file  Social Connections: Not on file     Family History:  The patient's ***family history includes Coronary artery disease in his brother; Hypertension in his brother, brother, and mother.   ROS:   Please see the history  of present illness.    ROS All other systems reviewed and are negative.   PHYSICAL EXAM:   VS:  There were no vitals taken for this visit.  Physical Exam  GEN: Well nourished, well developed, in no acute distress  HEENT: normal  Neck: no JVD, carotid bruits, or masses Cardiac:RRR; no murmurs, rubs, or gallops  Respiratory:  clear to auscultation bilaterally, normal work of breathing GI: soft, nontender, nondistended, + BS Ext: without cyanosis, clubbing, or edema, Good distal pulses bilaterally MS: no deformity or atrophy  Skin: warm and dry, no rash Neuro:  Alert and Oriented x 3, Strength and sensation are intact Psych: euthymic mood, full affect  Wt Readings from Last 3 Encounters:  10/31/20 185 lb (83.9 kg)  03/01/19 182 lb (82.6 kg)  08/29/18 179 lb 1.9 oz (81.2 kg)      Studies/Labs Reviewed:   EKG:  EKG is*** ordered today.  The ekg ordered today demonstrates ***  Recent Labs: No results found for requested labs within last 8760 hours.   Lipid Panel    Component Value Date/Time   CHOL 115 01/17/2018 1627   TRIG 51 01/17/2018 1627   HDL 40 01/17/2018 1627   CHOLHDL 2.9 01/17/2018 1627   CHOLHDL 4.1 10/30/2016 1018   VLDL 20 10/30/2016 1018   LDLCALC 65 01/17/2018 1627    Additional studies/ records that were reviewed today include:  2D echocardiogram 2017 Study Conclusions   - Left ventricle: The cavity size was normal. Wall thickness was   normal. Systolic function was mildly reduced. The estimated   ejection fraction was in the range of 45% to 50%. There is   dyskinesis of the apical myocardium; consistent with infarction   in the distribution of the left anterior descending coronary   artery. Doppler parameters are consistent with abnormal left   ventricular relaxation (grade 1 diastolic dysfunction). Acoustic   contrast opacification revealed no evidence ofthrombus.    Risk Assessment/Calculations:   {Does this patient have ATRIAL  FIBRILLATION?:[phone]}     ASSESSMENT:    No diagnosis found.   PLAN:  In order of problems listed above:  CAD STEMI 09/2011 treated with BMS to the LAD, LV apical thrombus treated with  anticoagulation  Ischemic cardiomyopathy EF 45 to 50% on echo in 2017 treated with beta-blocker ARB and Aldactone  Hypertension  HLD  SVT  History of LV apical thrombus resolved by echo in 2017 but given continued apical hypokinesis Dr. [name] recommended long-term anticoagulation as his apex has scar tissue and is at high risk for thrombus formation.  He was continued on Eliquis.  Shared Decision Making/Informed Consent   {Are you ordering a CV Procedure (e.g. stress test, cath, DCCV, TEE, etc)?   Press F2        :[id number]}    Medication Adjustments/Labs and Tests Ordered: Current medicines are  reviewed at length with the patient today.  Concerns regarding medicines are outlined above.  Medication changes, Labs and Tests ordered today are listed in the Patient Instructions below. There are no Patient Instructions on file for this visit.   Elson Clan, PA-C  12/03/2020 3:09 PM    [facility] [address] [place], [place], [place]  [address] Phone: [phone]; Fax: [phone]

## 2020-12-10 ENCOUNTER — Ambulatory Visit: Payer: BC Managed Care – PPO | Admitting: Physician Assistant

## 2020-12-10 NOTE — Progress Notes (Unsigned)
Cardiology Office Note    Date:  12/12/2020   ID:  Rodney Marquez, DOB Nov 08, 1966, MRN [id number]  PCP:  Georgann Housekeeper, MD  Cardiologist:  Verne Carrow, MD  Electrophysiologist:  None   Chief Complaint: overdue f/u CAD, CHF  History of Present Illness:   Rodney Marquez is a 55 y.o. male with history of CAD (anterior STEMI 09/2011 s/p BMS to LAD), LV apical thrombus, PE, HTN, HLD (h/o elevated LFTs), SVT, NSVT, PVCs, PACs, ischemic cardioymopathy, chronic combined CHF, h/o elevated LFTs who presents for follow-up.  He has been followed in the past by Dr. Riley Kill. He had an anterior STEMI 10/12/11 and found to have occluded LAD treated with BMS. He was treated with Coumadin following d/c for LV apical thrombus but went off this in 2013. He was seen in the [facility] ED with weakness 03/2015 and had negative head MRI. CT angio of the chest at that time showed very small amount of nonocclusive distal segmental/proximal subsegmental left lower lobe embolus as well as myocardial thinning of the left ventricular apex consistent with prior infarction with adjacent filling defect possibly reflecting thrombus in the left ventricular apex. F/u echo confirmed LV thrombus. He was placed back on anticoagulation this time with Eliquis. He was feeling poorly in follow-up thus stress test and event monitor were ordered. He did not complete these tests as outpatient but was subsequently admitted to [facility] 04/2015 with fatigue and had inpatient stress myoview with no ischemia; findings c/w prior MI, EF 47%. Metoprolol dose was reduced. 48 hour monitor was eventually performed June 2016 showing 12 beat run NSVT, rare PVCs, rare PACs. Lopressor dose was then increased again. He has since been on carvedilol instead. Last echo 2D echo 11/20/16 showed EF 45-50%, dyskinesis of apical myocardium, grade 1 DD. no evidence of thrombus. In regards to LV thrombus, Dr. [name] has previously stated "Resolved by echo in 2017 but  given continue apical hypokinesis, I would continue anti-coagulation long term as it is clear now that his apex has scar tissue and is high risk for thrombus formation."  He is seen back for follow-up feeling well without any angina, dyspnea, orthopnea, PND, syncope. He has rare palpitations but reports ever since being on carvedilol these are much better. His BP is running high. By Benjamin Stain it was 136/80 in December. He reports compliance with meds. Does not appear to be getting rx's through our office - reports they're from PCP.   Labwork independently reviewed: 04/2020 [facility] TChol 136, HDL 37, LDL 84, trig 79 12/2017 CBC wnl, CMET wnl, LDL 65 2017 TSH wnl   Past Medical History:  Diagnosis Date  . Cardiomyopathy, ischemic 09/2011  . Chronic combined systolic and diastolic CHF (congestive heart failure) (HCC)   . Coronary artery disease    late presentation with ant STEMI 11/12: LHC 10/12/11: oLM 20%, LAD occluded with dissection 2/2 MI.  PCI with BMS to LAD with poor distal runoff; small caliber apical vessel c/w edematous apex from delayed presentation.  . Elevated LFTs    Abdominal ultrasound was unremarkable  . Family history of early CAD    Brother had MI at age 52 yo.  Rodney Marquez HLD (hyperlipidemia)   . HTN (hypertension)   . 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 After STEMI 2012, also noted on CT in 2016. b. 2D echo 2017: no thrombus noted.  Rodney Marquez NSVT (nonsustained ventricular tachycardia) (HCC)   . PE (pulmonary embolism) 03/2015  a. CTA 03/2015: very small amount of nonocclusive distal segmental/proximal subsegmental left lower lobe embolus as well as myocardial thinning of the left ventricular apex consistent with prior infarction; Adjacent filling defect may reflect thrombus in the left ventricular apex.   . Premature atrial contractions   . PSVT (paroxysmal supraventricular tachycardia) (HCC)    s/p adenocard 12 mg IVP 10/2011  . PVC's (premature ventricular  contractions)   . ST elevation myocardial infarction (STEMI) of anterior wall (HCC) 09/2011   a. 09/2011 s/p BMS to occluded LAD.    Past Surgical History:  Procedure Laterality Date  . CORONARY ANGIOGRAM N/A 10/12/2011   Procedure: CORONARY ANGIOGRAM;  Surgeon: Herby Abraham, MD;  Location: [facility] CATH LAB;  Service: Cardiovascular;  Laterality: N/A;  . LEG SURGERY     cyst removal  . PERCUTANEOUS STENT INTERVENTION N/A 10/12/2011   Procedure: PERCUTANEOUS STENT INTERVENTION;  Surgeon: Herby Abraham, MD;  Location: [facility] CATH LAB;  Service: Cardiovascular;  Laterality: N/A;  . WRIST SURGERY     cyst removal     Current Medications: Current Meds  Medication Sig  . acetaminophen (TYLENOL) 325 MG tablet Take 650 mg by mouth every 6 (six) hours as needed for mild pain.  Rodney Marquez atorvastatin (LIPITOR) 40 MG tablet TAKE 1 TABLET EVERY EVENING  . carvedilol (COREG) 12.5 MG tablet Take 1 tablet (12.5 mg total) by mouth 2 (two) times daily with a meal.  . clonazePAM (KLONOPIN) 1 MG tablet Take 1 mg by mouth daily.  Rodney Marquez ELIQUIS 5 MG TABS tablet Take 5 mg by mouth 2 (two) times daily.  . hydrOXYzine (ATARAX/VISTARIL) 25 MG tablet Take 1 tablet (25 mg total) by mouth every 6 (six) hours.  Rodney Marquez losartan (COZAAR) 100 MG tablet TAKE 1 TABLET BY MOUTH EVERY DAY  . nitroGLYCERIN (NITROSTAT) 0.4 MG SL tablet Place 1 tablet (0.4 mg total) under the tongue every 5 (five) minutes as needed for chest pain.  Rodney Marquez sertraline (ZOLOFT) 50 MG tablet Take 0.5 tablets (25 mg total) by mouth daily.  . sildenafil (VIAGRA) 50 MG tablet Take 50 mg by mouth as needed for erectile dysfunction.  Rodney Marquez spironolactone (ALDACTONE) 25 MG tablet TAKE 1 TABLET BY MOUTH EVERY DAY  . zolpidem (AMBIEN) 5 MG tablet Take 5 mg by mouth at bedtime as needed for sleep.      Allergies:   Lisinopril   Social History   Socioeconomic History  . Marital status: Single    Spouse name: Not on file  . Number of children: 0  . Years of education:  Not on file  . Highest education level: Not on file  Occupational History  . Not on file  Tobacco Use  . Smoking status: Never Smoker  . Smokeless tobacco: Never Used  Vaping Use  . Vaping Use: Never used  Substance and Sexual Activity  . Alcohol use: Yes    Alcohol/week: 1.0 standard drink    Types: 1 Cans of beer per week    Comment: socially  . Drug use: No  . Sexual activity: Yes    Birth control/protection: None  Other Topics Concern  . Not on file  Social History Narrative  . Not on file   Social Determinants of Health   Financial Resource Strain: Not on file  Food Insecurity: Not on file  Transportation Needs: Not on file  Physical Activity: Not on file  Stress: Not on file  Social Connections: Not on file     Family History:  The patient's family history includes Coronary artery disease in his brother; Hypertension in his brother, brother, and mother.  ROS:   Please see the history of present illness. All other systems are reviewed and otherwise negative.    EKGs/Labs/Other Studies Reviewed:    Studies reviewed are outlined and summarized above. Reports included below if pertinent.  2D Echo 10/2016 Study Conclusions   - Left ventricle: The cavity size was normal. Wall thickness was  normal. Systolic function was mildly reduced. The estimated  ejection fraction was in the range of 45% to 50%. There is  dyskinesis of the apical myocardium; consistent with infarction  in the distribution of the left anterior descending coronary  artery. Doppler parameters are consistent with abnormal left  ventricular relaxation (grade 1 diastolic dysfunction). Acoustic  contrast opacification revealed no evidence ofthrombus.   2016 Nuc  Findings consistent with prior myocardial infarction.  The left ventricular ejection fraction is mildly decreased (45-54%).  This is an intermediate risk study.  Defect 1: There is a large defect of moderate severity  present in the basal inferolateral, mid inferolateral and apex location.   Fixed defect - No ischemia     EKG:  EKG is ordered today, personally reviewed, demonstrating NSR 61bpm, prior inferior infarct, diffuse TWI similar to prior  Recent Labs: No results found for requested labs within last 8760 hours.  Recent Lipid Panel    Component Value Date/Time   CHOL 115 01/17/2018 1627   TRIG 51 01/17/2018 1627   HDL 40 01/17/2018 1627   CHOLHDL 2.9 01/17/2018 1627   CHOLHDL 4.1 10/30/2016 1018   VLDL 20 10/30/2016 1018   LDLCALC 65 01/17/2018 1627    PHYSICAL EXAM:    VS:  BP (!) 150/90 (BP Location: Left Arm, Patient Position: Sitting, Cuff Size: Normal)   Pulse 61   Ht 5\' 7"  (1.702 m)   Wt 176 lb (79.8 kg)   SpO2 97%   BMI 27.57 kg/m   BMI: Body mass index is 27.57 kg/m.  GEN: Well nourished, well developed AAM, in no acute distress HEENT: normocephalic, atraumatic Neck: no JVD, carotid bruits, or masses Cardiac: RRR; no murmurs, rubs, or gallops, no edema  Respiratory:  clear to auscultation bilaterally, normal work of breathing GI: soft, nontender, nondistended, + BS MS: no deformity or atrophy Skin: warm and dry, no rash Neuro:  Alert and Oriented x 3, Strength and sensation are intact, follows commands Psych: euthymic mood, full affect  Wt Readings from Last 3 Encounters:  12/12/20 176 lb (79.8 kg)  10/31/20 185 lb (83.9 kg)  03/01/19 182 lb (82.6 kg)     ASSESSMENT & PLAN:   1. CAD - clinically stable without angina. Continue BB, statin. He has not been on aspirin since he's been on Eliquis chronically. Discussed interaction between NTG and sildenafil, advised to avoid one if taken the other in 48 hours and vice versa. Check CMET and lipids today (he is fasting).  2. Essential HTN -  BP elevated today. Recheck by me 145/90. He reports med compliance. Last fill clarification pending. BP at PCP office was 136/80 per [facility] in 10/2020. We need a better idea of what  it's running at home. He plans to get a new BP cuff. The patient was provided instructions on monitoring blood pressure at home and relaying results after 3-4 days of readings. Will check labs today as well to include CMET, TSH.  3. Chronic combined CHF - appears euvolemic. NYHA class 1 at this  point. Continue current regimen with an eye towards med titration for BP if needed. Reviewed 2g sodium restriction, 2L fluid restriction, daily weights with patient.  4. LV apical thrombus - no bleeding reported. Check CBC, BMET to ensure stable labs on Eliquis.  5. PVCs, PACs, NSVT, remote PSVT - improved since rx of carvedilol. Very infrequent palpitations at this point, once every few weeks, only seconds. Continue BB. Update lytes and TSH.  Disposition: Would like to arrange close f/u visit in 2 weeks for BP follow-up - patient prefers virtual and will have home cuff to relay readings.  Medication Adjustments/Labs and Tests Ordered: Current medicines are reviewed at length with the patient today.  Concerns regarding medicines are outlined above. Medication changes, Labs and Tests ordered today are summarized above and listed in the Patient Instructions accessible in Encounters.   Signed, Laurann Montana, PA-C  12/12/2020 12:07 PM    [facility] [address] [place], [place], [place]  [address] Phone: [phone]; Fax: [phone]

## 2020-12-12 ENCOUNTER — Ambulatory Visit (INDEPENDENT_AMBULATORY_CARE_PROVIDER_SITE_OTHER): Payer: BC Managed Care – PPO | Admitting: Physician Assistant

## 2020-12-12 ENCOUNTER — Telehealth: Payer: Self-pay | Admitting: *Deleted

## 2020-12-12 ENCOUNTER — Telehealth: Payer: Self-pay | Admitting: Cardiovascular Disease

## 2020-12-12 ENCOUNTER — Other Ambulatory Visit: Payer: Self-pay

## 2020-12-12 ENCOUNTER — Encounter: Payer: Self-pay | Admitting: Physician Assistant

## 2020-12-12 VITALS — BP 150/90 | HR 61 | Ht 67.0 in | Wt 176.0 lb

## 2020-12-12 DIAGNOSIS — I471 Supraventricular tachycardia: Secondary | ICD-10-CM | POA: Diagnosis not present

## 2020-12-12 DIAGNOSIS — I513 Intracardiac thrombosis, not elsewhere classified: Secondary | ICD-10-CM | POA: Diagnosis not present

## 2020-12-12 DIAGNOSIS — I472 Ventricular tachycardia: Secondary | ICD-10-CM

## 2020-12-12 DIAGNOSIS — I491 Atrial premature depolarization: Secondary | ICD-10-CM

## 2020-12-12 DIAGNOSIS — I10 Essential (primary) hypertension: Secondary | ICD-10-CM

## 2020-12-12 DIAGNOSIS — I5042 Chronic combined systolic (congestive) and diastolic (congestive) heart failure: Secondary | ICD-10-CM | POA: Diagnosis not present

## 2020-12-12 DIAGNOSIS — I2510 Atherosclerotic heart disease of native coronary artery without angina pectoris: Secondary | ICD-10-CM | POA: Diagnosis not present

## 2020-12-12 DIAGNOSIS — I493 Ventricular premature depolarization: Secondary | ICD-10-CM

## 2020-12-12 DIAGNOSIS — I4729 Other ventricular tachycardia: Secondary | ICD-10-CM

## 2020-12-12 NOTE — Telephone Encounter (Signed)
Per Dayna Dunn, PA-C, called over to [facility] to confirm whether they are refilling pt's Eliquis, Losartan, Carvedilol, & Spironolactone. Left a message for Nicole to call back.  

## 2020-12-12 NOTE — Telephone Encounter (Signed)
  Patient Consent for Virtual Visit         Rodney Marquez has provided verbal consent on 12/12/2020 for a virtual visit (video or telephone).   CONSENT FOR VIRTUAL VISIT FOR:  Rodney Marquez  By participating in this virtual visit I agree to the following:  I hereby voluntarily request, consent and authorize [facility] and its employed or contracted physicians, physician assistants, nurse practitioners or other licensed health care professionals (the Practitioner), to provide me with telemedicine health care services (the "Services") as deemed necessary by the treating Practitioner. I acknowledge and consent to receive the Services by the Practitioner via telemedicine. I understand that the telemedicine visit will involve communicating with the Practitioner through live audiovisual communication technology and the disclosure of certain medical information by electronic transmission. I acknowledge that I have been given the opportunity to request an in-person assessment or other available alternative prior to the telemedicine visit and am voluntarily participating in the telemedicine visit.  I understand that I have the right to withhold or withdraw my consent to the use of telemedicine in the course of my care at any time, without affecting my right to future care or treatment, and that the Practitioner or I may terminate the telemedicine visit at any time. I understand that I have the right to inspect all information obtained and/or recorded in the course of the telemedicine visit and may receive copies of available information for a reasonable fee.  I understand that some of the potential risks of receiving the Services via telemedicine include:  [name] Delay or interruption in medical evaluation due to technological equipment failure or disruption; . Information transmitted may not be sufficient (e.g. poor resolution of images) to allow for appropriate medical decision making by the Practitioner;  and/or  . In rare instances, security protocols could fail, causing a breach of personal health information.  Furthermore, I acknowledge that it is my responsibility to provide information about my medical history, conditions and care that is complete and accurate to the best of my ability. I acknowledge that Practitioner's advice, recommendations, and/or decision may be based on factors not within their control, such as incomplete or inaccurate data provided by me or distortions of diagnostic images or specimens that may result from electronic transmissions. I understand that the practice of medicine is not an exact science and that Practitioner makes no warranties or guarantees regarding treatment outcomes. I acknowledge that a copy of this consent can be made available to me via my patient portal [facility] MyChart), or I can request a printed copy by calling the office of [facility].    I understand that my insurance will be billed for this visit.   I have read or had this consent read to me. . I understand the contents of this consent, which adequately explains the benefits and risks of the Services being provided via telemedicine.  . I have been provided ample opportunity to ask questions regarding this consent and the Services and have had my questions answered to my satisfaction. . I give my informed consent for the services to be provided through the use of telemedicine in my medical care

## 2020-12-12 NOTE — Telephone Encounter (Signed)
Joni Reining from [facility] is returning [employer]. Please advise.

## 2020-12-12 NOTE — Patient Instructions (Addendum)
Medication Instructions:  Your physician recommends that you continue on your current medications as directed. Please refer to the Current Medication list given to you today.  *If you need a refill on your cardiac medications before your next appointment, please call your pharmacy*   Lab Work: TODAY:  CMET, MAG, LIPID, CBC, & TSH   If you have labs (blood work) drawn today and your tests are completely normal, you will receive your results only by: [name] MyChart Message (if you have MyChart) OR . A paper copy in the mail If you have any lab test that is abnormal or we need to change your treatment, we will call you to review the results.   Testing/Procedures: None ordered   Follow-Up: At [facility], you and your health needs are our priority.  As part of our continuing mission to provide you with exceptional heart care, we have created designated Provider Care Teams.  These Care Teams include your primary Cardiologist (physician) and Advanced Practice Providers (APPs -  Physician Assistants and Nurse Practitioners) who all work together to provide you with the care you need, when you need it.  We recommend signing up for the patient portal called "MyChart".  Sign up information is provided on this After Visit Summary.  MyChart is used to connect with patients for Virtual Visits (Telemedicine).  Patients are able to view lab/test results, encounter notes, upcoming appointments, etc.  Non-urgent messages can be sent to your provider as well.   To learn more about what you can do with MyChart, go to [email or web address].    Your next appointment:   2 WEEK   12/26/2020 9:15   The format for your next appointment:   VIRTUAL (SEE INSTRUCTIONS BELOW)  Provider:   You may see Verne Carrow, MD or one of the following Advanced Practice Providers on your designated Care Team:    Ronie Spies, PA-C  Jacolyn Reedy, PA-C    Other Instructions  We need to get an idea of what your  blood pressure is running. I would recommend using a blood pressure cuff that goes on your arm. The wrist ones can be inaccurate. If possible, try to select one that also reports your heart rate. To check your blood pressure, choose a time at least 3 hours after taking your blood pressure medicines. If you can sample it at different times of the day, that's great - it might give you more information about how your blood pressure fluctuates. Remain seated in a chair for 5 minutes quietly beforehand, then check it.  Please record a list of those readings and call us/send in MyChart message with them after you have 3-4 days of readings.  Do not take nitroglycerin if you have taken sildenafil in the last 48 hours. The opposite is true as well. Do not take sildenafil if you have taken nitroglycerin in the last 48 hours. If you take these two medicines, they can cause low blood pressure if taken together.    For patients with congestive heart failure, we give them these special instructions:  1. Follow a low-salt diet - you are allowed no more than 2,000mg  of sodium per day. Watch your fluid intake. In general, you should not be taking in more than 2 liters of fluid per day (close to 64 oz of fluid per day). This includes sources of water in foods like soup, coffee, tea, milk, etc. 2. Weigh yourself on the same scale at same time of day and keep a log.  3. Call your doctor: (Anytime you feel any of the following symptoms)  - 3lb weight gain overnight or 5lb within a few days - Shortness of breath, with or without a dry hacking cough  - Swelling in the hands, feet or stomach  - If you have to sleep on extra pillows at night in order to breathe   IT IS IMPORTANT TO LET YOUR DOCTOR KNOW EARLY ON IF YOU ARE HAVING SYMPTOMS SO WE CAN HELP YOU!    Your [facility] team (Cardiologist [Heart Doctor] and Advanced Practice Provider [phone] Assistant; Nurse Practitioner]) has arranged for your next office  appointment to be a virtual visit (also known as "Telehealth", "Telemedicine", "E-Visit").   We now offer virtual visits for all our patients.  This helps [place] to expand our ability to see patients in a timely and safe manner.  These visits are billed to your insurance just like traditional, in person, appointments.  Please review this IMPORTANT information about your upcoming appointment.   **PLEASE READ THE SECTION BELOW LABELED "CONSENT".**   **CALL OUR OFFICE WITH QUESTIONS.**   WHAT YOU NEED FOR YOUR VIRTUAL VISIT:  You will need a SmartPhone with microphone and video capability.  [If you are using MyChart to connect to your visit, it is also possible to use a desktop/laptop computer (with an Internet connection), as long as you have microphone and video capability.]  You will need to use Chrome, Edge or [employer] as your Chiropodist.  We highly recommend that you have a MyChart account as this will make connecting to your visit seamless.  A MyChart account not only allows you to connect to your provider for a virtual visit, but also allows you to see the results of all your tests, provider notes, medications and upcoming appointments.    A MyChart account is not absolutely necessary.  We can still complete your visit if you do not have one.    If you do not have a computer or SmartPhone with video/microphone capability or your Internet/cell service is weak on the day of your visit, we will do your visit by telephone.    A blood pressure cuff and scale are essential to collect your vital signs at home.  If you do not have these and you are unable to obtain them, please contact our office so that we can make arrangements for you.  If you have a pulse oximeter, Apple watch, Kardia mobile device, etc, you can collect data from these devices as well to share with the provider for your visit.  These devices are not required for a virtual visit.    WHAT TO DO ON THE DAY OF YOUR  APPOINTMENT: 30 minutes before your appointment:  Take your blood pressure, pulse or heart rate (if your blood pressure machine is able to collect it) and weight.  Write all these numbers down so you can give it to the nurse or medical assistant that calls.  Get all of the medications you currently take and put them where you will be sitting for the appointment.  The nurse/medical assistant will go over these with you when he/she calls.  15 minutes before your appointment:  You will receive a phone call from a nurse or medical assistant from our office.  The caller ID on your phone may indicate that the caller is either "[facility]" or "[place]".  However, the number may come across as spam.  Please turn off any spam blocker so you  do not miss the call.  The nurse will:  Ask for your blood pressure, pulse, weight, height.  Go over all of your medications to make sure your chart is correct.  Review your allergies, smoking history, reason for appointment, etc.   Give you instructions on how to connect with the video platform or telephone.  IF YOUR VISIT IS BY TELEPHONE ONLY: After the nurse finishes getting you ready for the visit, your provider will call you on the phone number you provide to [place].   TO CONNECT WITH YOUR PROVIDER FOR YOUR APPOINTMENT (BY VIDEO): You will either connect with the provider with your MyChart account or (if you are not using MyChart) with a link sent to your SmartPhone by text message.  If you are using MyChart, see below.  If you are not using MyChart, the nurse will send you the text message during the phone call.     If you are connecting with your MyChart account:   (The nurse that calls you will tell you when to do this):  You will log into your account. At the top of your home screen, you should see the following prompt that tells you to "Begin your video visit with . . . ".  Click the green button (BEGIN VISIT).    The next screen will say  "It's time to start your video visit!" Click the green button (BEGIN VIDEO VISIT)    There may be a screen that appears that asks for permission to use your camera and/or microphone.  Click ALLOW.  This will open the browser where your appointment will take place.   You may see the message: "Welcome.  Waiting for the call to begin."  Or, you will see that the nurse or your provider are already "in" the room waiting on you.   If you are connecting with a link sent to your SmartPhone via text: The nurse that calls you will send the link by text message. Click on the link (it should look something like this):     If asked to give permission to use the camera and or microphone, click ALLOW This will open the browser where your appointment will take place.      You may see the message: "Welcome.  Waiting for the call to begin."  Or, you will see that the nurse or your provider are already "in" the room waiting on you.    The controls for your visit look like the picture below.  Please note that this is what the microphone and camera look like when they are ON.  If muted, they will have a line through them.    After the appointment: Once your provider leaves the appointment, he/she will go over instructions with the nurse/medical assistant.  If needed, this person will call you with any instructions, appointments, etc.   A copy of your After Visit Summary (AVS) will be available later that day in your MyChart account.  This document will have all of your instructions, medications, appointments, etc.  If you are not using MyChart, we will mail it to your home.     *CONSENT FOR TELE-HEALTH VISIT - PLEASE REVIEW* By participating in the scheduled virtual visit (and any virtual visit scheduled within 365 days of the printing of this document), I agree to the following:   I hereby voluntarily request, consent and authorize [facility] and its employed or contracted physicians, Electronics engineer, nurse practitioners or other licensed health care  professionals (the Practitioner), to provide me with telemedicine health care services (the "Services") as deemed necessary by the treating Practitioner. I acknowledge and consent to receive the Services by the Practitioner via telemedicine. I understand that the telemedicine visit will involve communicating with the Practitioner through live audiovisual communication technology and the disclosure of certain medical information by electronic transmission. I acknowledge that I have been given the opportunity to request an in-person assessment or other available alternative prior to the telemedicine visit and am voluntarily participating in the telemedicine visit.  I understand that I have the right to withhold or withdraw my consent to the use of telemedicine in the course of my care at any time, without affecting my right to future care or treatment, and that the Practitioner or I may terminate the telemedicine visit at any time. I understand that I have the right to inspect all information obtained and/or recorded in the course of the telemedicine visit and may receive copies of available information for a reasonable fee.  I understand that some of the potential risks of receiving the Services via telemedicine include:  [name] Delay or interruption in medical evaluation due to technological equipment failure or disruption; . Information transmitted may not be sufficient (e.g. poor resolution of images) to allow for appropriate medical decision making by the Practitioner; and/or  . In rare instances, security protocols could fail, causing a breach of personal health information.  Furthermore, I acknowledge that it is my responsibility to provide information about my medical history, conditions and care that is complete and accurate to the best of my ability. I acknowledge that Practitioner's advice, recommendations, and/or decision may be based on  factors not within their control, such as incomplete or inaccurate data provided by me or distortions of diagnostic images or specimens that may result from electronic transmissions. I understand that the practice of medicine is not an exact science and that Practitioner makes no warranties or guarantees regarding treatment outcomes. I acknowledge that a copy of this consent can be made available to me via my patient portal [facility] MyChart), or I can request a printed copy by calling the office of [facility].    I understand that my insurance will be billed for this visit.   I have read or had this consent read to me. . I understand the contents of this consent, which adequately explains the benefits and risks of the Services being provided via telemedicine.  . I have been provided ample opportunity to ask questions regarding this consent and the Services and have had my questions answered to my satisfaction. . I give my informed consent for the services to be provided through the use of telemedicine in my medical care

## 2020-12-12 NOTE — Telephone Encounter (Signed)
Returned call to [employer], left another message for her to call back. There was a phone note opened when I called the 1st time, this is duplicate. Will delete one.

## 2020-12-12 NOTE — Telephone Encounter (Signed)
° ° °  Rodney Marquez called back she said Dr. [name] agreed to refilling Eliquis, Losartan, Carvedilol, & Spironolactone. She said if [name] have further question to call her back

## 2020-12-12 NOTE — Telephone Encounter (Addendum)
Thank you. To clarify for the chart, the intention was to confirm he had been getting his medicines from a different source (PCP) since no refills were listed in our system. We should likely be the ones refilling going forward. Thank you for looking into this!

## 2020-12-12 NOTE — Telephone Encounter (Signed)
Per Ronie Spies, PA-C, called over to [facility] to confirm whether they are refilling pt's Eliquis, Losartan, Carvedilol, & Spironolactone. Left a message for Joni Reining to call back.

## 2020-12-13 ENCOUNTER — Telehealth: Payer: Self-pay | Admitting: *Deleted

## 2020-12-13 DIAGNOSIS — I2510 Atherosclerotic heart disease of native coronary artery without angina pectoris: Secondary | ICD-10-CM

## 2020-12-13 DIAGNOSIS — E782 Mixed hyperlipidemia: Secondary | ICD-10-CM

## 2020-12-13 LAB — LIPID PANEL
Chol/HDL Ratio: 4.1 ratio (ref 0.0–5.0)
Cholesterol, Total: 165 mg/dL (ref 100–199)
HDL: 40 mg/dL (ref >39)
LDL Chol Calc (NIH): 113 mg/dL — ABNORMAL HIGH (ref 0–99)
Triglycerides: 61 mg/dL (ref 0–149)
VLDL Cholesterol Cal: 12 mg/dL (ref 5–40)

## 2020-12-13 LAB — CBC
Hematocrit: 41.7 % (ref 37.5–51.0)
Hemoglobin: 14.4 g/dL (ref 13.0–17.7)
MCH: 32.4 pg (ref 26.6–33.0)
MCHC: 34.5 g/dL (ref 31.5–35.7)
MCV: 94 fL (ref 79–97)
Platelets: 157 10*3/uL (ref 150–450)
RBC: 4.45 x10E6/uL (ref 4.14–5.80)
RDW: 13.7 % (ref 11.6–15.4)
WBC: 3.7 10*3/uL (ref 3.4–10.8)

## 2020-12-13 LAB — COMPREHENSIVE METABOLIC PANEL
ALT: 27 IU/L (ref 0–44)
AST: 26 IU/L (ref 0–40)
Albumin/Globulin Ratio: 1.7 (ref 1.2–2.2)
Albumin: 3.9 g/dL (ref 3.8–4.9)
Alkaline Phosphatase: 91 IU/L (ref 44–121)
BUN/Creatinine Ratio: 10 (ref 9–20)
BUN: 11 mg/dL (ref 6–24)
Bilirubin Total: 0.9 mg/dL (ref 0.0–1.2)
CO2: 28 mmol/L (ref 20–29)
Calcium: 9.5 mg/dL (ref 8.7–10.2)
Chloride: 105 mmol/L (ref 96–106)
Creatinine, Ser: 1.13 mg/dL (ref 0.76–1.27)
GFR calc Af Amer: 86 mL/min/{1.73_m2} (ref >59)
GFR calc non Af Amer: 74 mL/min/{1.73_m2} (ref >59)
Globulin, Total: 2.3 g/dL (ref 1.5–4.5)
Glucose: 81 mg/dL (ref 65–99)
Potassium: 4.9 mmol/L (ref 3.5–5.2)
Sodium: 141 mmol/L (ref 134–144)
Total Protein: 6.2 g/dL (ref 6.0–8.5)

## 2020-12-13 LAB — TSH: TSH: 1.11 u[IU]/mL (ref 0.450–4.500)

## 2020-12-13 LAB — MAGNESIUM: Magnesium: 2.1 mg/dL (ref 1.6–2.3)

## 2020-12-13 MED ORDER — ATORVASTATIN CALCIUM 80 MG PO TABS: 80.0000 mg | ORAL_TABLET | Freq: Every day | ORAL | 3 refills | Status: DC

## 2020-12-13 NOTE — Telephone Encounter (Signed)
-----   Message from Laurann Montana, [place] sent at 12/13/2020  8:59 AM EST ----- Labs are all totally stable and look great except LDL is too high for his heart disease. Would recommend titrating atorvastatin to 80mg  daily with recheck liver/lipids in 8 weeks. Remind him to call or MyChart with his BP with 3-4 days of readings once he gets a cuff. Dayna Dunn PA-C

## 2020-12-23 NOTE — Progress Notes (Unsigned)
Virtual Visit via Video Note   This visit type was conducted due to national recommendations for restrictions regarding the COVID-19 Pandemic (e.g. social distancing) in an effort to limit this patient's exposure and mitigate transmission in our community.  Due to his co-morbid illnesses, this patient is at least at moderate risk for complications without adequate follow up.  This format is felt to be most appropriate for this patient at this time.  All issues noted in this document were discussed and addressed.  A limited physical exam was performed with this format.  Please refer to the patient's chart for his consent to telehealth for [facility].  The patient was identified using 2 identifiers.  Date:  12/26/2020   ID:  Rodney Marquez, DOB [birth year], MRN [id number]  Patient Location: Home Provider Location: Office/Clinic  PCP:  Georgann Housekeeper, MD  Cardiologist:  Verne Carrow, MD  Electrophysiologist:  None   Evaluation Performed:  Follow-Up Visit  Chief Complaint:  F/u HTN  History of Present Illness:    Rodney Marquez is a 55 y.o. male with CAD (anterior STEMI 09/2011 s/p BMS to LAD), LV apical thrombus, PE, HTN, HLD (h/o elevated LFTs), SVT, NSVT, PVCs, PACs, ischemic cardioymopathy, chronic combined CHF, h/o elevated LFTs who presents for follow-up.  He has been followed in the past by Dr. Riley Kill. He had an anterior STEMI 10/12/11 and found to have occluded LAD treated with BMS. He was treated with Coumadin following d/c for LV apical thrombus but went off this in 2013. He was seen in the [facility] ED with weakness 03/2015 and had negative head MRI. CT angio of the chest at that time showed very small amount of nonocclusive distal segmental/proximal subsegmental left lower lobe embolus as well as myocardial thinning of the left ventricular apex consistent with prior infarction with adjacent filling defect possibly reflecting thrombus in the left ventricular apex. F/u echo  confirmed LV thrombus. He was placed back on anticoagulation this time with Eliquis. He was feeling poorly in follow-up thus stress test and event monitor were ordered. He did not complete these tests as outpatient but was subsequently admitted to [facility] 04/2015 with fatigue and had inpatient stress myoview with no ischemia; findings c/w prior MI, EF 47%. Metoprolol dose was reduced. 48 hour monitor was eventually performed June 2016 showing 12 beat run NSVT, rare PVCs, rare PACs. Lopressor dose was then increased again. He has since been on carvedilol instead with improvement in palpitations. Last echo 2D echo 11/20/16 showed EF 45-50%, dyskinesis of apical myocardium, grade 1 DD. no evidence of thrombus. In regards to LV thrombus, Dr. [name] has previously stated "Resolved by echo in 2017 but given continue apical hypokinesis, I would continue anti-coagulation long term as it is clear now that his apex has scar tissue and is high risk for thrombus formation." Dr. [name] has preferred Eliquis as the anticoagulant of choice in this patient. At last OV 12/12/20, he was doing well clinically except BP was elevated. He was given instructions for home monitoring and asked to get back with our office after 3-4 days of readings but do not see this occurred. Statin was titrated for LDL 113.   He is seen back for follow-up today doing well without any complaints. We reviewed his BP readings and majority are in the 130+/80+ range. His systolics are not ridiculously high but his diastolic tends to run upwards in the high 80s-90s at times. Just received new atorvastatin rx.  Labs Independently Reviewed 12/12/20  CMET wnl with K 4.9, TSH wnl, Mg 2.1, LDL 113, CBC wnl   Past Medical History:  Diagnosis Date  . Cardiomyopathy, ischemic 09/2011  . Chronic combined systolic and diastolic CHF (congestive heart failure) (HCC)   . Coronary artery disease    late presentation with ant STEMI 11/12: LHC 10/12/11: oLM 20%, LAD  occluded with dissection 2/2 MI.  PCI with BMS to LAD with poor distal runoff; small caliber apical vessel c/w edematous apex from delayed presentation.  . Elevated LFTs    Abdominal ultrasound was unremarkable  . Family history of early CAD    Brother had MI at age 35 yo.  [name] HLD (hyperlipidemia)   . HTN (hypertension)   . 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 After STEMI 2012, also noted on CT in 2016. b. 2D echo 2017: no thrombus noted.  [name] NSVT (nonsustained ventricular tachycardia) (HCC)   . PE (pulmonary embolism) 03/2015   a. CTA 03/2015: very small amount of nonocclusive distal segmental/proximal subsegmental left lower lobe embolus as well as myocardial thinning of the left ventricular apex consistent with prior infarction; Adjacent filling defect may reflect thrombus in the left ventricular apex.   . Premature atrial contractions   . PSVT (paroxysmal supraventricular tachycardia) (HCC)    s/p adenocard 12 mg IVP 10/2011  . PVC's (premature ventricular contractions)   . ST elevation myocardial infarction (STEMI) of anterior wall (HCC) 09/2011   a. 09/2011 s/p BMS to occluded LAD.   Past Surgical History:  Procedure Laterality Date  . CORONARY ANGIOGRAM N/A 10/12/2011   Procedure: CORONARY ANGIOGRAM;  Surgeon: Herby Abraham, MD;  Location: [facility] CATH LAB;  Service: Cardiovascular;  Laterality: N/A;  . LEG SURGERY     cyst removal  . PERCUTANEOUS STENT INTERVENTION N/A 10/12/2011   Procedure: PERCUTANEOUS STENT INTERVENTION;  Surgeon: Herby Abraham, MD;  Location: [facility] CATH LAB;  Service: Cardiovascular;  Laterality: N/A;  . WRIST SURGERY     cyst removal      Current Meds  Medication Sig  . acetaminophen (TYLENOL) 325 MG tablet Take 650 mg by mouth every 6 (six) hours as needed for mild pain.  [name] atorvastatin (LIPITOR) 80 MG tablet Take 1 tablet (80 mg total) by mouth daily.  . carvedilol (COREG) 12.5 MG tablet Take 1 tablet (12.5 mg total) by  mouth 2 (two) times daily with a meal.  . clonazePAM (KLONOPIN) 1 MG tablet Take 1 mg by mouth daily.  [name] ELIQUIS 5 MG TABS tablet Take 5 mg by mouth 2 (two) times daily.  . hydrOXYzine (ATARAX/VISTARIL) 25 MG tablet Take 1 tablet (25 mg total) by mouth every 6 (six) hours.  [name] losartan (COZAAR) 100 MG tablet TAKE 1 TABLET BY MOUTH EVERY DAY  . nitroGLYCERIN (NITROSTAT) 0.4 MG SL tablet Place 1 tablet (0.4 mg total) under the tongue every 5 (five) minutes as needed for chest pain.  [name] sertraline (ZOLOFT) 50 MG tablet Take 0.5 tablets (25 mg total) by mouth daily.  . sildenafil (VIAGRA) 50 MG tablet Take 50 mg by mouth as needed for erectile dysfunction.  [name] spironolactone (ALDACTONE) 25 MG tablet TAKE 1 TABLET BY MOUTH EVERY DAY  . zolpidem (AMBIEN) 5 MG tablet Take 5 mg by mouth at bedtime as needed for sleep.     Allergies:   Lisinopril   Social History   Tobacco Use  . Smoking status: Never Smoker  . Smokeless tobacco: Never Used  Vaping Use  .  Vaping Use: Never used  Substance Use Topics  . Alcohol use: Yes    Alcohol/week: 1.0 standard drink    Types: 1 Cans of beer per week    Comment: socially  . Drug use: No     Family Hx: The patient's family history includes Coronary artery disease in his brother; Hypertension in his brother, brother, and mother.  ROS:   Please see the history of present illness.    All other systems reviewed and are negative.   Prior CV studies:   The following studies were reviewed today:  2D Echo 10/2016 Study Conclusions   - Left ventricle: The cavity size was normal. Wall thickness was  normal. Systolic function was mildly reduced. The estimated  ejection fraction was in the range of 45% to 50%. There is  dyskinesis of the apical myocardium; consistent with infarction  in the distribution of the left anterior descending coronary  artery. Doppler parameters are consistent with abnormal left  ventricular relaxation (grade 1  diastolic dysfunction). Acoustic  contrast opacification revealed no evidence ofthrombus.   2016 Nuc  Findings consistent with prior myocardial infarction.  The left ventricular ejection fraction is mildly decreased (45-54%).  This is an intermediate risk study.  Defect 1: There is a large defect of moderate severity present in the basal inferolateral, mid inferolateral and apex location.  Fixed defect - No ischemia     Labs/Other Tests and Data Reviewed:    EKG:  An ECG dated 12/12/20 was personally reviewed today and demonstrated:  NSR 61bpm, prior inferior infarct, diffuse TWI similar to prior  Recent Labs: 12/12/2020: ALT 27; BUN 11; Creatinine, Ser 1.13; Hemoglobin 14.4; Magnesium 2.1; Platelets 157; Potassium 4.9; Sodium 141; TSH 1.110   Recent Lipid Panel Lab Results  Component Value Date/Time   CHOL 165 12/12/2020 12:17 PM   TRIG 61 12/12/2020 12:17 PM   HDL 40 12/12/2020 12:17 PM   CHOLHDL 4.1 12/12/2020 12:17 PM   CHOLHDL 4.1 10/30/2016 10:18 AM   LDLCALC 113 (H) 12/12/2020 12:17 PM    Wt Readings from Last 3 Encounters:  12/26/20 186 lb (84.4 kg)  12/12/20 176 lb (79.8 kg)  10/31/20 185 lb (83.9 kg)     Objective:    Vital Signs:  BP 132/88   Pulse (!) 55   Ht 5\' 7"  (1.702 m)   Wt 186 lb (84.4 kg)   BMI 29.13 kg/m    VS reviewed. General - calm M in no acute distress HEENT - NCAT, EOM intact Pulm - No labored breathing, no coughing during visit, no audible wheezing, speaking in full sentences Neuro - A+Ox3, no slurred speech, answers questions appropriately Psych - Pleasant affect  ASSESSMENT & PLAN:    1. Essential HTN - BP suboptimally controlled. Compliant with regimen as outlined above. He is on max dose losartan. Cannot titrate carvedilol due to his resting HR 50s. His potassium is already upper limits of normal so would recommend to keep spironolactone at present dose. Avoid Imdur due to sildenafil use. I think a small dose of  chlorthalidone will likely get him to where he needs to be. Start 12.5mg  daily with recheck BMET in 1 week. If there is any disruption in his kidney function or electrolytes we can try hydralazine, but prefer to start with a medicine dosed once daily instead of TID first. We will check in with him at that time to find out how his BPs are running.  2. CAD with HLD goal LDL <70 -  no angina. He has not been on aspirin since he's been on Eliquis chronically. Continue BB. He now shares that there were previous issues getting his statin filled, which I suspect led to his increased LDL. Given his coronary history it still makes sense for him to be on high dose statin as tolerated. He has received this and just started it within the last few days. We'll push out his upcoming March labs to be at least 6-8 weeks from start date.  3. Chronic combined CHF - denies any new CHF symptoms. Also discussed impact of lifestyle modifications to include reduction in any sodium or alcohol, healthy diet and increased physical activity.  This was a problem focused visit. See note dated 12/12/20 for comprehensive assessment of recent cardiac issues - no significant changes from that visit except as noted above.  Time:   Today, I have spent 10 minutes with the patient with telehealth technology discussing the above problems.     Medication Adjustments/Labs and Tests Ordered: Current medicines are reviewed at length with the patient today.  Testing and concerns regarding medicines are outlined above.    Follow Up:  In Person 6 months with me or Dr. [name]  Signed, Laurann Montana, PA-C  12/26/2020 9:17 AM    [facility]

## 2020-12-26 ENCOUNTER — Telehealth (INDEPENDENT_AMBULATORY_CARE_PROVIDER_SITE_OTHER): Payer: BC Managed Care – PPO | Admitting: Physician Assistant

## 2020-12-26 ENCOUNTER — Encounter: Payer: Self-pay | Admitting: Physician Assistant

## 2020-12-26 ENCOUNTER — Other Ambulatory Visit: Payer: Self-pay

## 2020-12-26 VITALS — BP 132/88 | HR 55 | Ht 67.0 in | Wt 186.0 lb

## 2020-12-26 DIAGNOSIS — I5042 Chronic combined systolic (congestive) and diastolic (congestive) heart failure: Secondary | ICD-10-CM | POA: Diagnosis not present

## 2020-12-26 DIAGNOSIS — I2510 Atherosclerotic heart disease of native coronary artery without angina pectoris: Secondary | ICD-10-CM | POA: Diagnosis not present

## 2020-12-26 DIAGNOSIS — I10 Essential (primary) hypertension: Secondary | ICD-10-CM | POA: Diagnosis not present

## 2020-12-26 MED ORDER — CHLORTHALIDONE 25 MG PO TABS: 12.5000 mg | ORAL_TABLET | Freq: Every day | ORAL | 3 refills | Status: DC

## 2020-12-26 NOTE — Patient Instructions (Signed)
Medication Instructions:  Your physician has recommended you make the following change in your medication:  1.  START Chlorthalidone 25 mg taking only 1/2 tablet daily (this has been [facility] to [facility])  *If you need a refill on your cardiac medications before your next appointment, please call your pharmacy*   Lab Work: NEXT Friday 01/03/2021:  COME TO THE OFFICE ANYTIME BETWEEN 7:30 A.M. - 4:30 P.M FOR:  BMET  02/06/2021:  COME TO THE OFFICE FOR FASTING FOR LIPID PANEL (nothing to eat or drink after midnight the night before)  If you have labs (blood work) drawn today and your tests are completely normal, you will receive your results only by: [name] MyChart Message (if you have MyChart) OR . A paper copy in the mail If you have any lab test that is abnormal or we need to change your treatment, we will call you to review the results.   Testing/Procedures: None ordered   Follow-Up: At [facility], you and your health needs are our priority.  As part of our continuing mission to provide you with exceptional heart care, we have created designated Provider Care Teams.  These Care Teams include your primary Cardiologist (physician) and Advanced Practice Providers (APPs -  Physician Assistants and Nurse Practitioners) who all work together to provide you with the care you need, when you need it.  We recommend signing up for the patient portal called "MyChart".  Sign up information is provided on this After Visit Summary.  MyChart is used to connect with patients for Virtual Visits (Telemedicine).  Patients are able to view lab/test results, encounter notes, upcoming appointments, etc.  Non-urgent messages can be sent to your provider as well.   To learn more about what you can do with MyChart, go to [email or web address].    Your next appointment:   6 month(s)  The format for your next appointment:   In Person  Provider:   Verne Carrow, MD or Ronie Spies, PA-C   Other  Instructions

## 2021-01-03 ENCOUNTER — Other Ambulatory Visit: Payer: Self-pay

## 2021-01-03 ENCOUNTER — Other Ambulatory Visit: Payer: BC Managed Care – PPO | Admitting: *Deleted

## 2021-01-03 ENCOUNTER — Telehealth: Payer: Self-pay | Admitting: Physician Assistant

## 2021-01-03 DIAGNOSIS — I2510 Atherosclerotic heart disease of native coronary artery without angina pectoris: Secondary | ICD-10-CM

## 2021-01-03 DIAGNOSIS — Z79899 Other long term (current) drug therapy: Secondary | ICD-10-CM

## 2021-01-03 DIAGNOSIS — I10 Essential (primary) hypertension: Secondary | ICD-10-CM | POA: Diagnosis not present

## 2021-01-03 DIAGNOSIS — I5042 Chronic combined systolic (congestive) and diastolic (congestive) heart failure: Secondary | ICD-10-CM

## 2021-01-03 LAB — BASIC METABOLIC PANEL
BUN/Creatinine Ratio: 14 (ref 9–20)
BUN: 20 mg/dL (ref 6–24)
CO2: 28 mmol/L (ref 20–29)
Calcium: 9.5 mg/dL (ref 8.7–10.2)
Chloride: 95 mmol/L — ABNORMAL LOW (ref 96–106)
Creatinine, Ser: 1.4 mg/dL — ABNORMAL HIGH (ref 0.76–1.27)
GFR calc Af Amer: 66 mL/min/{1.73_m2} (ref >59)
GFR calc non Af Amer: 57 mL/min/{1.73_m2} — ABNORMAL LOW (ref >59)
Glucose: 114 mg/dL — ABNORMAL HIGH (ref 65–99)
Potassium: 3.3 mmol/L — ABNORMAL LOW (ref 3.5–5.2)
Sodium: 136 mmol/L (ref 134–144)

## 2021-01-03 MED ORDER — AMLODIPINE BESYLATE 5 MG PO TABS: 5.0000 mg | ORAL_TABLET | Freq: Every day | ORAL | 3 refills | Status: DC

## 2021-01-03 MED ORDER — POTASSIUM CHLORIDE CRYS ER 20 MEQ PO TBCR: EXTENDED_RELEASE_TABLET | ORAL | 0 refills | Status: AC

## 2021-01-03 NOTE — Telephone Encounter (Signed)
   BMET reviewed today. Surprisingly with just addition of chlorthalidone, he bumped his Cr to 1.4 and developed pretty significant drop in K rom 4.9 down to 3.3. I called and spoke with the patient who reports he had been taking 1 full tablet instead of 1/2 tablet. He states at first his BP did not go down much, still 150s, but the last reading was 127/80. Initially I had planned to reduce chlorthalidone and follow BP/K but the patient is going out of town and it will be challenging to safely navigate his hypokalemia this way. I worry that hydralazine might be a challenge with its TID dosing. Cannot escalate current regimen otherwise (see last note) - cannot increase carvedilol due to baseline HR 50s, and spironolactone because his K is usually in the upper normal end. Since he has not had any clinical signs of fluid overload/HF recently I think addition of amlodipine is worth a try. Therefore per our discussion we'll enact the following plan: we are stopping chlorthalidone, having him pick up a one time dose of potassium tonight ( x1) and start amlodipine 5mg  daily ([facility] we [facility]).  Will route to [place] to help [place] arrange a BMET when the patient returns to town to trend K, Cr, at which time we'll also check in with him about how his BPs are running. He was very appreciative of care.

## 2021-01-07 NOTE — Telephone Encounter (Signed)
[name], when you are able to reach him, can we also add a urinalysis at that time to assess for proteinuria? Just needs a standard UA, dx essential HTN. Thx.

## 2021-01-07 NOTE — Telephone Encounter (Signed)
Call placed to pt to try to arrange a BMET, as per conversation with Ronie Spies, PA-C below. No answer, no machine.

## 2021-01-13 NOTE — Addendum Note (Signed)
Addended by: Burnetta Sabin on: 01/13/2021 10:57 AM   Modules accepted: Orders

## 2021-01-13 NOTE — Telephone Encounter (Signed)
Spoke with pt.  He will come in tomorrow for bmet & ua. Pt stated that he did cut his Amlodipine back to 1/2 tablet due to that he thought his bp was too low and he was feeling very fatigued for no reason.  Pt will send a message via mychart with his readings from 5-10 days to [facility], PA-C so she can take a look at them.

## 2021-01-13 NOTE — Telephone Encounter (Deleted)
Spoke

## 2021-01-14 ENCOUNTER — Other Ambulatory Visit: Payer: BC Managed Care – PPO | Admitting: *Deleted

## 2021-01-14 ENCOUNTER — Other Ambulatory Visit: Payer: Self-pay

## 2021-01-14 DIAGNOSIS — I10 Essential (primary) hypertension: Secondary | ICD-10-CM | POA: Diagnosis not present

## 2021-01-14 DIAGNOSIS — Z79899 Other long term (current) drug therapy: Secondary | ICD-10-CM

## 2021-01-14 LAB — BASIC METABOLIC PANEL
BUN/Creatinine Ratio: 11 (ref 9–20)
BUN: 12 mg/dL (ref 6–24)
CO2: 21 mmol/L (ref 20–29)
Calcium: 8.7 mg/dL (ref 8.7–10.2)
Chloride: 108 mmol/L — ABNORMAL HIGH (ref 96–106)
Creatinine, Ser: 1.07 mg/dL (ref 0.76–1.27)
GFR calc Af Amer: 92 mL/min/{1.73_m2} (ref >59)
GFR calc non Af Amer: 79 mL/min/{1.73_m2} (ref >59)
Glucose: 110 mg/dL — ABNORMAL HIGH (ref 65–99)
Potassium: 4.1 mmol/L (ref 3.5–5.2)
Sodium: 142 mmol/L (ref 134–144)

## 2021-01-14 NOTE — Addendum Note (Signed)
Addended by: Burnetta Sabin on: 01/14/2021 07:29 AM   Modules accepted: Orders

## 2021-01-15 LAB — URINALYSIS
Bilirubin, UA: NEGATIVE
Glucose, UA: NEGATIVE
Ketones, UA: NEGATIVE
Leukocytes,UA: NEGATIVE
Nitrite, UA: NEGATIVE
Protein,UA: NEGATIVE
RBC, UA: NEGATIVE
Specific Gravity, UA: 1.017 (ref 1.005–1.030)
Urobilinogen, Ur: 0.2 mg/dL (ref 0.2–1.0)
pH, UA: 5.5 (ref 5.0–7.5)

## 2021-01-20 ENCOUNTER — Telehealth: Payer: Self-pay | Admitting: *Deleted

## 2021-01-20 NOTE — Telephone Encounter (Signed)
Pt called in the office to report his bp readings:  2/23 115/67  69 2/24 121/78  62 2/25 120/77  60 2/26 122/76  64 2/27  117/77  66 2/28 123/77  65   Pt advised that we will send the information over to [employer], PA-C and get back with him if any changes are to be made, but pt advised that his bp looked really good.

## 2021-01-20 NOTE — Telephone Encounter (Signed)
Covering for Dayna. BP's look great. Continue current treatment. thanks

## 2021-01-31 ENCOUNTER — Other Ambulatory Visit: Payer: BC Managed Care – PPO

## 2021-02-06 ENCOUNTER — Other Ambulatory Visit: Payer: BC Managed Care – PPO | Admitting: *Deleted

## 2021-02-06 ENCOUNTER — Other Ambulatory Visit: Payer: Self-pay

## 2021-02-06 DIAGNOSIS — E782 Mixed hyperlipidemia: Secondary | ICD-10-CM

## 2021-02-06 DIAGNOSIS — I2510 Atherosclerotic heart disease of native coronary artery without angina pectoris: Secondary | ICD-10-CM

## 2021-02-06 LAB — HEPATIC FUNCTION PANEL
ALT: 44 IU/L (ref 0–44)
AST: 34 IU/L (ref 0–40)
Albumin: 4.4 g/dL (ref 3.8–4.9)
Alkaline Phosphatase: 95 IU/L (ref 44–121)
Bilirubin Total: 0.9 mg/dL (ref 0.0–1.2)
Bilirubin, Direct: 0.2 mg/dL (ref 0.00–0.40)
Total Protein: 6.4 g/dL (ref 6.0–8.5)

## 2021-02-06 LAB — LIPID PANEL
Chol/HDL Ratio: 3.3 ratio (ref 0.0–5.0)
Cholesterol, Total: 115 mg/dL (ref 100–199)
HDL: 35 mg/dL — ABNORMAL LOW (ref >39)
LDL Chol Calc (NIH): 69 mg/dL (ref 0–99)
Triglycerides: 48 mg/dL (ref 0–149)
VLDL Cholesterol Cal: 11 mg/dL (ref 5–40)

## 2021-03-20 ENCOUNTER — Other Ambulatory Visit: Payer: Self-pay | Admitting: Physician Assistant

## 2021-05-19 DIAGNOSIS — I10 Essential (primary) hypertension: Secondary | ICD-10-CM | POA: Diagnosis not present

## 2021-05-19 DIAGNOSIS — Z0000 Encounter for general adult medical examination without abnormal findings: Secondary | ICD-10-CM | POA: Diagnosis not present

## 2021-05-19 DIAGNOSIS — Z23 Encounter for immunization: Secondary | ICD-10-CM | POA: Diagnosis not present

## 2021-05-19 DIAGNOSIS — E782 Mixed hyperlipidemia: Secondary | ICD-10-CM | POA: Diagnosis not present

## 2021-05-19 DIAGNOSIS — Z125 Encounter for screening for malignant neoplasm of prostate: Secondary | ICD-10-CM | POA: Diagnosis not present

## 2021-05-19 DIAGNOSIS — R7303 Prediabetes: Secondary | ICD-10-CM | POA: Diagnosis not present

## 2021-05-19 DIAGNOSIS — I2510 Atherosclerotic heart disease of native coronary artery without angina pectoris: Secondary | ICD-10-CM | POA: Diagnosis not present

## 2021-11-03 DIAGNOSIS — H6693 Otitis media, unspecified, bilateral: Secondary | ICD-10-CM | POA: Diagnosis not present

## 2021-11-06 DIAGNOSIS — E782 Mixed hyperlipidemia: Secondary | ICD-10-CM | POA: Diagnosis not present

## 2021-11-06 DIAGNOSIS — I10 Essential (primary) hypertension: Secondary | ICD-10-CM | POA: Diagnosis not present

## 2021-11-06 DIAGNOSIS — R7303 Prediabetes: Secondary | ICD-10-CM | POA: Diagnosis not present

## 2021-11-06 DIAGNOSIS — F419 Anxiety disorder, unspecified: Secondary | ICD-10-CM | POA: Diagnosis not present

## 2021-11-06 DIAGNOSIS — Z23 Encounter for immunization: Secondary | ICD-10-CM | POA: Diagnosis not present

## 2022-05-19 DIAGNOSIS — E782 Mixed hyperlipidemia: Secondary | ICD-10-CM | POA: Diagnosis not present

## 2022-05-19 DIAGNOSIS — Z23 Encounter for immunization: Secondary | ICD-10-CM | POA: Diagnosis not present

## 2022-05-19 DIAGNOSIS — I5042 Chronic combined systolic (congestive) and diastolic (congestive) heart failure: Secondary | ICD-10-CM | POA: Diagnosis not present

## 2022-05-19 DIAGNOSIS — Z0000 Encounter for general adult medical examination without abnormal findings: Secondary | ICD-10-CM | POA: Diagnosis not present

## 2022-05-19 DIAGNOSIS — R7303 Prediabetes: Secondary | ICD-10-CM | POA: Diagnosis not present

## 2022-05-19 DIAGNOSIS — I2510 Atherosclerotic heart disease of native coronary artery without angina pectoris: Secondary | ICD-10-CM | POA: Diagnosis not present

## 2022-05-19 DIAGNOSIS — Z125 Encounter for screening for malignant neoplasm of prostate: Secondary | ICD-10-CM | POA: Diagnosis not present

## 2022-05-19 DIAGNOSIS — I236 Thrombosis of atrium, auricular appendage, and ventricle as current complications following acute myocardial infarction: Secondary | ICD-10-CM | POA: Diagnosis not present

## 2022-05-19 DIAGNOSIS — I10 Essential (primary) hypertension: Secondary | ICD-10-CM | POA: Diagnosis not present

## 2022-08-19 DIAGNOSIS — R7303 Prediabetes: Secondary | ICD-10-CM | POA: Diagnosis not present

## 2022-08-26 DIAGNOSIS — Z23 Encounter for immunization: Secondary | ICD-10-CM | POA: Diagnosis not present

## 2022-08-26 DIAGNOSIS — E1169 Type 2 diabetes mellitus with other specified complication: Secondary | ICD-10-CM | POA: Diagnosis not present

## 2022-08-26 DIAGNOSIS — I255 Ischemic cardiomyopathy: Secondary | ICD-10-CM | POA: Diagnosis not present

## 2022-08-26 DIAGNOSIS — I10 Essential (primary) hypertension: Secondary | ICD-10-CM | POA: Diagnosis not present

## 2022-11-11 DIAGNOSIS — E782 Mixed hyperlipidemia: Secondary | ICD-10-CM | POA: Diagnosis not present

## 2022-11-11 DIAGNOSIS — I10 Essential (primary) hypertension: Secondary | ICD-10-CM | POA: Diagnosis not present

## 2022-11-11 DIAGNOSIS — I236 Thrombosis of atrium, auricular appendage, and ventricle as current complications following acute myocardial infarction: Secondary | ICD-10-CM | POA: Diagnosis not present

## 2022-11-11 DIAGNOSIS — E1169 Type 2 diabetes mellitus with other specified complication: Secondary | ICD-10-CM | POA: Diagnosis not present

## 2022-11-12 NOTE — Progress Notes (Unsigned)
Office Visit    Patient Name: Rodney Marquez Date of Encounter: 11/12/2022  PCP:  Georgann Housekeeper, MD   [facility]  Cardiologist:  Verne Carrow, MD  Advanced Practice Provider:  No care team member to display Electrophysiologist:  None   HPI    Rodney Marquez is a 56 y.o. male with a past medical history of CAD (anterior STEMI 09/2011 status post BMS to LAD), LV apical thrombus, PE, HTN, HLD (history of elevated LFTs), SVT, NSVT, PVCs, PACs, ischemic cardiomyopathy, chronic combined CHF, history of elevated LFTs presents today for follow-up appointment.  He was followed by Dr. Riley Kill in the past.  Anterior STEMI 10/12/2011 and found to have occluded LAD treated with BMS.  Treated with Coumadin following DC for LV apical thrombus but went off of this in 2013.  Seen in [facility] ED with weakness 03/2015 and had negative head MRI.  CT angio of the chest at that time showed very small amount of nonocclusive distal segmental/proximal subsegmental left lower lobe embolus as well as myocardial thickening of the left ventricle consistent with prior infarction.  Follow-up echocardiogram confirmed LV thrombus.  Was placed on anticoagulation at this time with Eliquis.  He was feeling poorly at follow-up therefore a stress test and monitor were ordered.  He did not complete these test as outpatient but was subsequently admitted to [facility] 04/2015 with fatigue and had inpatient stress Myoview with no ischemia.  LVEF 47%.  Metoprolol was reduced.  48-hour monitor was eventually performed June 2016 showing a 12 beat run of NSVT, rare PVCs, rare PACs.  Lopressor dose was increased again.  He has since been on carvedilol.  2D echocardiogram 11/20/2016 showed EF 45 to 50%, dyskinesis of apical myocardium, grade 1 DD, no evidence of thrombus.  In regards to LV thrombus, Dr. [name] had stated "resolved by echo in 2017 but given apical hypokinesis, I would continue anticoagulation long-term  as it is clear now that his apex has scar tissue and is at high risk for thrombus formation."  He was seen back and was feeling well at that time.  Had rare palpitations but reported since being on carvedilol these were much improved.  BP was running high.  He reported compliance with medications.  Today, he ***  Past Medical History    Past Medical History:  Diagnosis Date   Cardiomyopathy, ischemic 09/2011   Chronic combined systolic and diastolic CHF (congestive heart failure) (HCC)    Coronary artery disease    late presentation with ant STEMI 11/12: LHC 10/12/11: oLM 20%, LAD occluded with dissection 2/2 MI.  PCI with BMS to LAD with poor distal runoff; small caliber apical vessel c/w edematous apex from delayed presentation.   Elevated LFTs    Abdominal ultrasound was unremarkable   Family history of early CAD    Brother had MI at age 58 yo.   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 After STEMI 2012, also noted on CT in 2016. b. 2D echo 2017: no thrombus noted.   NSVT (nonsustained ventricular tachycardia) (HCC)    PE (pulmonary embolism) 03/2015   a. CTA 03/2015: very small amount of nonocclusive distal segmental/proximal subsegmental left lower lobe embolus as well as myocardial thinning of the left ventricular apex consistent with prior infarction; Adjacent filling defect may reflect thrombus in the left ventricular apex.    Premature atrial contractions  PSVT (paroxysmal supraventricular tachycardia) (HCC)    s/p adenocard 12 mg IVP 10/2011   PVC's (premature ventricular contractions)    ST elevation myocardial infarction (STEMI) of anterior wall (HCC) 09/2011   a. 09/2011 s/p BMS to occluded LAD.   Past Surgical History:  Procedure Laterality Date   CORONARY ANGIOGRAM N/A 10/12/2011   Procedure: CORONARY ANGIOGRAM;  Surgeon: Herby Abraham, MD;  Location: [facility] CATH LAB;  Service: Cardiovascular;   Laterality: N/A;   LEG SURGERY     cyst removal   PERCUTANEOUS STENT INTERVENTION N/A 10/12/2011   Procedure: PERCUTANEOUS STENT INTERVENTION;  Surgeon: Herby Abraham, MD;  Location: [facility] CATH LAB;  Service: Cardiovascular;  Laterality: N/A;   WRIST SURGERY     cyst removal     Allergies  Allergies  Allergen Reactions   Lisinopril Cough    EKGs/Labs/Other Studies Reviewed:   The following studies were reviewed today:  2D Echo 10/2016 Study Conclusions   - Left ventricle: The cavity size was normal. Wall thickness was    normal. Systolic function was mildly reduced. The estimated    ejection fraction was in the range of 45% to 50%. There is    dyskinesis of the apical myocardium; consistent with infarction    in the distribution of the left anterior descending coronary    artery. Doppler parameters are consistent with abnormal left    ventricular relaxation (grade 1 diastolic dysfunction). Acoustic    contrast opacification revealed no evidence ofthrombus.    2016 Nuc Findings consistent with prior myocardial infarction. The left ventricular ejection fraction is mildly decreased (45-54%). This is an intermediate risk study. Defect 1: There is a large defect of moderate severity present in the basal inferolateral, mid inferolateral and apex location.   Fixed defect - No ischemia    EKG:  EKG is *** ordered today.  The ekg ordered today demonstrates ***  Recent Labs: No results found for requested labs within last 365 days.  Recent Lipid Panel    Component Value Date/Time   CHOL 115 02/06/2021 0806   TRIG 48 02/06/2021 0806   HDL 35 (L) 02/06/2021 0806   CHOLHDL 3.3 02/06/2021 0806   CHOLHDL 4.1 10/30/2016 1018   VLDL 20 10/30/2016 1018   LDLCALC 69 02/06/2021 0806    Risk Assessment/Calculations:  {Does this patient have ATRIAL FIBRILLATION?[phone]}  Home Medications   No outpatient medications have been marked as taking for the 11/13/22 encounter  (Appointment) with Sharlene Dory, PA-C.     Review of Systems   ***   All other systems reviewed and are otherwise negative except as noted above.  Physical Exam    VS:  There were no vitals taken for this visit. , BMI There is no height or weight on file to calculate BMI.  Wt Readings from Last 3 Encounters:  12/26/20 186 lb (84.4 kg)  12/12/20 176 lb (79.8 kg)  10/31/20 185 lb (83.9 kg)     GEN: Well nourished, well developed, in no acute distress. HEENT: normal. Neck: Supple, no JVD, carotid bruits, or masses. Cardiac: ***RRR, no murmurs, rubs, or gallops. No clubbing, cyanosis, edema.  ***Radials/PT 2+ and equal bilaterally.  Respiratory:  ***Respirations regular and unlabored, clear to auscultation bilaterally. GI: Soft, nontender, nondistended. MS: No deformity or atrophy. Skin: Warm and dry, no rash. Neuro:  Strength and sensation are intact. Psych: Normal affect.  Assessment & Plan    CAD Essential hypertension Chronic combined CHF LV apical thrombus PVCs, PACs,  NSVT, remote PSVT  No BP recorded.  {Refresh Note OR Click here to enter BP  :1}***      Disposition: Follow up {follow up:15908} with Verne Carrow, MD or APP.  Signed, Sharlene Dory, PA-C 11/12/2022, 8:30 AM [facility]

## 2022-11-13 ENCOUNTER — Ambulatory Visit: Payer: BC Managed Care – PPO | Admitting: Physician Assistant

## 2022-12-14 NOTE — Progress Notes (Deleted)
Cardiology Office Note    Date:  12/14/2022   ID:  Rodney Marquez, DOB [birth year], MRN [id number]  PCP:  Wenda Low, MD  Cardiologist:  Lauree Chandler, MD  Electrophysiologist:  None   Chief Complaint: ***  History of Present Illness:   Rodney Marquez is a 57 y.o. male with history of CAD (anterior STEMI 09/2011 s/p BMS to LAD), LV apical thrombus, PE, HTN, HLD (h/o elevated LFTs), SVT, NSVT, PVCs, PACs, ischemic cardioymopathy, chronic combined CHF, h/o elevated LFTs who presents for follow-up.   He has been followed in the past by Dr. Lia Foyer. He had an anterior STEMI 09/2011 with occluded LAD treated with BMS. He was treated with Coumadin following d/c for LV apical thrombus but went off this in 2013. He was seen in the [facility] ED with weakness 03/2015 and had negative head MRI. CT angio of the chest at that time showed very small amount of nonocclusive distal segmental/proximal subsegmental left lower lobe embolus as well as myocardial thinning of the left ventricular apex consistent with prior infarction with adjacent filling defect possibly reflecting thrombus in the left ventricular apex. F/u echo confirmed LV thrombus. He was placed back on anticoagulation this time with Eliquis. He was feeling poorly in follow-up thus stress test and event monitor were ordered. He did not complete these tests as outpatient but was subsequently admitted to [facility] 04/2015 with fatigue and had inpatient stress myoview with no ischemia; findings c/w prior MI, EF 47%. Metoprolol dose was reduced. 48 hour monitor was eventually performed June 2016 showing 12 beat run NSVT, rare PVCs, rare PACs. Lopressor dose was then increased again. He has since been on carvedilol instead with improvement in palpitations. Last echo 2D echo 11/20/16 showed EF 45-50%, dyskinesis of apical myocardium, grade 1 DD. no evidence of thrombus. In regards to LV thrombus, Dr. Angelena Form has previously stated "Resolved by echo in 2017 but  given continue apical hypokinesis, I would continue anti-coagulation long term as it is clear now that his apex has scar tissue and is high risk for thrombus formation." Dr. Angelena Form has preferred Eliquis as the anticoagulant of choice in this patient.  CAD with HLD goal LDL <70 Essential HTN Chronic combined CHF LV thrombus PVCs, PACs, NSVT, PSVT   Labwork independently reviewed: 01/2021 LFTs wnl, LDL 69 12/2020 K 4.1, Cr 1.07 11/2020 TSH wnl, CBC wnl   Past History   Past Medical History:  Diagnosis Date   Cardiomyopathy, ischemic 09/2011   Chronic combined systolic and diastolic CHF (congestive heart failure) ([place])    Coronary artery disease    late presentation with ant STEMI 11/12: LHC 10/12/11: oLM 20%, LAD occluded with dissection 2/2 MI.  PCI with BMS to LAD with poor distal runoff; small caliber apical vessel c/w edematous apex from delayed presentation.   Elevated LFTs    Abdominal ultrasound was unremarkable   Family history of early CAD    Brother had MI at age 47 yo.   HLD (hyperlipidemia)    HTN (hypertension)    LV (left ventricular) mural thrombus following MI ([place]) 09/2011   a. After STEMI 2012, also noted on CT in 2016. b. 2D echo 2017: no thrombus noted.   NSVT (nonsustained ventricular tachycardia) (HCC)    PE (pulmonary embolism) 03/2015   a. CTA 03/2015: very small amount of nonocclusive distal segmental/proximal subsegmental left lower lobe embolus as well as myocardial thinning of the left ventricular apex consistent with prior infarction; Adjacent filling defect may  reflect thrombus in the left ventricular apex.    Premature atrial contractions    PSVT (paroxysmal supraventricular tachycardia) (HCC)    s/p adenocard 12 mg IVP 10/2011   PVC's (premature ventricular contractions)    ST elevation myocardial infarction (STEMI) of anterior wall ([place]) 09/2011   a. 09/2011 s/p BMS to occluded LAD.    Past Surgical History:  Procedure Laterality Date    CORONARY ANGIOGRAM N/A 10/12/2011   Procedure: CORONARY ANGIOGRAM;  Surgeon: Hillary Bow, MD;  Location: [facility] CATH LAB;  Service: Cardiovascular;  Laterality: N/A;   LEG SURGERY     cyst removal   PERCUTANEOUS STENT INTERVENTION N/A 10/12/2011   Procedure: PERCUTANEOUS STENT INTERVENTION;  Surgeon: Hillary Bow, MD;  Location: [facility] CATH LAB;  Service: Cardiovascular;  Laterality: N/A;   WRIST SURGERY     cyst removal     Current Medications: No outpatient medications have been marked as taking for the 12/18/22 encounter (Appointment) with Charlie Pitter, PA-C.   ***   Allergies:   Lisinopril   Social History   Socioeconomic History   Marital status: Single    Spouse name: Not on file   Number of children: 0   Years of education: Not on file   Highest education level: Not on file  Occupational History   Not on file  Tobacco Use   Smoking status: Never   Smokeless tobacco: Never  Vaping Use   Vaping Use: Never used  Substance and Sexual Activity   Alcohol use: Yes    Alcohol/week: 1.0 standard drink of alcohol    Types: 1 Cans of beer per week    Comment: socially   Drug use: No   Sexual activity: Yes    Birth control/protection: None  Other Topics Concern   Not on file  Social History Narrative   Not on file   Social Determinants of Health   Financial Resource Strain: Not on file  Food Insecurity: Not on file  Transportation Needs: Not on file  Physical Activity: Not on file  Stress: Not on file  Social Connections: Not on file     Family History:  The patient's ***family history includes Coronary artery disease in his brother; Hypertension in his brother, brother, and mother.  ROS:   Please see the history of present illness. Otherwise, review of systems is positive for ***.  All other systems are reviewed and otherwise negative.    EKG(s)/Additional Testing   EKG:  EKG is ordered today, personally reviewed, demonstrating ***  CV Studies: Cardiac  studies reviewed are outlined and summarized above. Otherwise please see EMR for full report.  Recent Labs: No results found for requested labs within last 365 days.  Recent Lipid Panel    Component Value Date/Time   CHOL 115 02/06/2021 0806   TRIG 48 02/06/2021 0806   HDL 35 (L) 02/06/2021 0806   CHOLHDL 3.3 02/06/2021 0806   CHOLHDL 4.1 10/30/2016 1018   VLDL 20 10/30/2016 1018   LDLCALC 69 02/06/2021 0806    PHYSICAL EXAM:    VS:  There were no vitals taken for this visit.  BMI: There is no height or weight on file to calculate BMI.  GEN: Well nourished, well developed male in no acute distress HEENT: normocephalic, atraumatic Neck: no JVD, carotid bruits, or masses Cardiac: ***RRR; no murmurs, rubs, or gallops, no edema  Respiratory:  clear to auscultation bilaterally, normal work of breathing GI: soft, nontender, nondistended, + BS MS: no deformity  or atrophy Skin: warm and dry, no rash Neuro:  Alert and Oriented x 3, Strength and sensation are intact, follows commands Psych: euthymic mood, full affect  Wt Readings from Last 3 Encounters:  12/26/20 186 lb (84.4 kg)  12/12/20 176 lb (79.8 kg)  10/31/20 185 lb (83.9 kg)     ASSESSMENT & PLAN:   ***     Disposition: F/u with ***   Medication Adjustments/Labs and Tests Ordered: Current medicines are reviewed at length with the patient today.  Concerns regarding medicines are outlined above. Medication changes, Labs and Tests ordered today are summarized above and listed in the Patient Instructions accessible in Encounters.   Signed, Charlie Pitter, PA-C  12/14/2022 3:25 PM    [place] Phone: [phone]; Fax: [phone]

## 2022-12-18 ENCOUNTER — Ambulatory Visit: Payer: BC Managed Care – PPO | Admitting: Physician Assistant

## 2022-12-18 DIAGNOSIS — I493 Ventricular premature depolarization: Secondary | ICD-10-CM

## 2022-12-18 DIAGNOSIS — I2510 Atherosclerotic heart disease of native coronary artery without angina pectoris: Secondary | ICD-10-CM

## 2022-12-18 DIAGNOSIS — I5042 Chronic combined systolic (congestive) and diastolic (congestive) heart failure: Secondary | ICD-10-CM

## 2022-12-18 DIAGNOSIS — I4729 Other ventricular tachycardia: Secondary | ICD-10-CM

## 2022-12-18 DIAGNOSIS — I513 Intracardiac thrombosis, not elsewhere classified: Secondary | ICD-10-CM

## 2022-12-18 DIAGNOSIS — E785 Hyperlipidemia, unspecified: Secondary | ICD-10-CM

## 2022-12-18 DIAGNOSIS — I491 Atrial premature depolarization: Secondary | ICD-10-CM

## 2022-12-18 DIAGNOSIS — I10 Essential (primary) hypertension: Secondary | ICD-10-CM

## 2022-12-18 DIAGNOSIS — I471 Supraventricular tachycardia, unspecified: Secondary | ICD-10-CM

## 2023-01-06 NOTE — Progress Notes (Signed)
Cardiology Office Note:    Date:  01/19/2023   ID:  BENJMAN FREMONT, DOB 03-08-1966, MRN [id number]:[id number]  PCP:  Wenda Low, [place] [facility] Cardiologist:  Lauree Chandler, MD     Referring MD: Wenda Low, MD   Chief Complaint:  Follow-up     History of Present Illness:   Rodney Marquez is a 57 y.o. male with  a past medical history of CAD (anterior STEMI 09/2011 status post BMS to LAD), LV apical thrombus, PE, HTN, HLD (history of elevated LFTs), SVT, NSVT, PVCs, PACs, ischemic cardiomyopathy, chronic combined CHF, history of elevated LFTs     Patient last seen in our office 11/2020 and BP high.   Patient says when he wakes up his heart is racing and he's snoring. Calms down in 1-2 min. Occurs about 3 times/month. CoQ 10 seems to help. Denies chest pain or dyspnea, edema. No regular exercise. Works as a Glass blower/designer. Eliquis changed to Xarelto due to insurance. BP up today but usually 123/77. Hasn't taken meds yet.      Past Medical History:  Diagnosis Date   Cardiomyopathy, ischemic 09/2011   Chronic combined systolic and diastolic CHF (congestive heart failure) ([place])    Coronary artery disease    late presentation with ant STEMI 11/12: LHC 10/12/11: oLM 20%, LAD occluded with dissection 2/2 MI.  PCI with BMS to LAD with poor distal runoff; small caliber apical vessel c/w edematous apex from delayed presentation.   Elevated LFTs    Abdominal ultrasound was unremarkable   Family history of early CAD    Brother had MI at age 39 yo.   HLD (hyperlipidemia)    HTN (hypertension)    LV (left ventricular) mural thrombus following MI ([place]) 09/2011   a. After STEMI 2012, also noted on CT in 2016. b. 2D echo 2017: no thrombus noted.   NSVT (nonsustained ventricular tachycardia) (HCC)    PE (pulmonary embolism) 03/2015   a. CTA 03/2015: very small amount of nonocclusive distal segmental/proximal subsegmental left lower lobe embolus as well as myocardial  thinning of the left ventricular apex consistent with prior infarction; Adjacent filling defect may reflect thrombus in the left ventricular apex.    Premature atrial contractions    PSVT (paroxysmal supraventricular tachycardia)    s/p adenocard 12 mg IVP 10/2011   PVC's (premature ventricular contractions)    ST elevation myocardial infarction (STEMI) of anterior wall ([place]) 09/2011   a. 09/2011 s/p BMS to occluded LAD.   Current Medications: Current Meds  Medication Sig   acetaminophen (TYLENOL) 325 MG tablet Take 650 mg by mouth every 6 (six) hours as needed for mild pain.   amLODipine (NORVASC) 5 MG tablet Take 1 tablet (5 mg total) by mouth daily.   atorvastatin (LIPITOR) 80 MG tablet Take 1 tablet (80 mg total) by mouth daily.   carvedilol (COREG) 12.5 MG tablet Take 1 tablet (12.5 mg total) by mouth 2 (two) times daily with a meal.   clonazePAM (KLONOPIN) 1 MG tablet Take 1 mg by mouth daily.   hydrOXYzine (ATARAX/VISTARIL) 25 MG tablet Take 1 tablet (25 mg total) by mouth every 6 (six) hours.   losartan (COZAAR) 100 MG tablet TAKE 1 TABLET BY MOUTH EVERY DAY   potassium chloride SA (KLOR-CON) 20 MEQ tablet Take 2 tablets by mouth once now.   sertraline (ZOLOFT) 50 MG tablet Take 0.5 tablets (25 mg total) by mouth daily.   sildenafil (VIAGRA) 50 MG tablet Take  50 mg by mouth as needed for erectile dysfunction.   spironolactone (ALDACTONE) 25 MG tablet TAKE 1 TABLET BY MOUTH EVERY DAY   zolpidem (AMBIEN) 5 MG tablet Take 5 mg by mouth at bedtime as needed for sleep.   [DISCONTINUED] ELIQUIS 5 MG TABS tablet Take 5 mg by mouth 2 (two) times daily.   [DISCONTINUED] nitroGLYCERIN (NITROSTAT) 0.4 MG SL tablet Place 1 tablet (0.4 mg total) under the tongue every 5 (five) minutes as needed for chest pain.   [DISCONTINUED] rivaroxaban (XARELTO) 20 MG TABS tablet Take 20 mg by mouth daily with supper.    Allergies:   Lisinopril   Social History   Tobacco Use   Smoking status: Never    Smokeless tobacco: Never  Vaping Use   Vaping Use: Never used  Substance Use Topics   Alcohol use: Yes    Alcohol/week: 1.0 standard drink of alcohol    Types: 1 Cans of beer per week    Comment: socially   Drug use: No    Family Hx: The patient's family history includes Coronary artery disease in his brother; Hypertension in his brother, brother, and mother.  ROS     Physical Exam:    VS:  BP (!) 120/90 (BP Location: Right Arm)   Pulse 64   Ht '5\' 7"'$  (1.702 m)   Wt 186 lb (84.4 kg)   SpO2 98%   BMI 29.13 kg/m     Wt Readings from Last 3 Encounters:  01/19/23 186 lb (84.4 kg)  12/26/20 186 lb (84.4 kg)  12/12/20 176 lb (79.8 kg)    Physical Exam  GEN: Well nourished, well developed, in no acute distress  Neck: no JVD, carotid bruits, or masses Cardiac:RRR; no murmurs, rubs, or gallops  Respiratory:  clear to auscultation bilaterally, normal work of breathing GI: soft, nontender, nondistended, + BS Ext: without cyanosis, clubbing, or edema, Good distal pulses bilaterally Neuro:  Alert and Oriented x 3,  Psych: euthymic mood, full affect        EKGs/Labs/Other Test Reviewed:    EKG:  EKG is   ordered today.  The ekg ordered today demonstrates NSR, inf infarct, diffuse TW changes unchanged.  Recent Labs: No results found for requested labs within last 365 days.   Recent Lipid Panel No results for input(s): "CHOL", "TRIG", "HDL", "VLDL", "LDLCALC", "LDLDIRECT" in the last 8760 hours.   Prior CV Studies:     2D Echo 10/2016 Study Conclusions   - Left ventricle: The cavity size was normal. Wall thickness was    normal. Systolic function was mildly reduced. The estimated    ejection fraction was in the range of 45% to 50%. There is    dyskinesis of the apical myocardium; consistent with infarction    in the distribution of the left anterior descending coronary    artery. Doppler parameters are consistent with abnormal left    ventricular relaxation (grade 1  diastolic dysfunction). Acoustic    contrast opacification revealed no evidence ofthrombus.    2016 Nuc Findings consistent with prior myocardial infarction. The left ventricular ejection fraction is mildly decreased (45-54%). This is an intermediate risk study. Defect 1: There is a large defect of moderate severity present in the basal inferolateral, mid inferolateral and apex location.   Fixed defect - No ischemia  Risk Assessment/Calculations/Metrics:     STOP-Bang Score:  6      HYPERTENSION CONTROL Vitals:   01/19/23 0741 01/19/23 0804  BP: (!) 132/100 (!) 120/90  The patient's blood pressure is elevated above target today.  In order to address the patient's elevated BP: Blood pressure will be monitored at home to determine if medication changes need to be made.; The blood pressure is usually elevated in clinic.  Blood pressures monitored at home have been optimal.       ASSESSMENT & PLAN:   No problem-specific Assessment & Plan notes found for this encounter.   CAD anterior STEMI 09/2011 status post BMS to LAD), LV apical thrombus, no chest pain, not on ASA with Xarelto  Essential hypertension-BP high today but hasn't taken meds and controlled at home  Chronic combined CHF compensated-last echo 2017. Will check echo  LV apical thrombus-plan for long term anti-coag given apical HK and scar so high risk for thrombus. Eliquis changed to Xarelto.  PVCs, PACs, NSVT, remote PSVT  Snoring with tachycardia-awakening him at night. Will order sleep study.           Dispo:  Return in about 1 year (around 01/20/2024) for Routine follow up in 1 year with Dr. Angelena Form.   Medication Adjustments/Labs and Tests Ordered: Current medicines are reviewed at length with the patient today.  Concerns regarding medicines are outlined above.  Tests Ordered: Orders Placed This Encounter  Procedures   EKG 12-Lead   ECHOCARDIOGRAM COMPLETE   [facility] Sleep Study   Medication  Changes: Meds ordered this encounter  Medications   rivaroxaban (XARELTO) 20 MG TABS tablet    Sig: Take 1 tablet (20 mg total) by mouth daily with supper.    Dispense:  30 tablet    Refill:  11   nitroGLYCERIN (NITROSTAT) 0.4 MG SL tablet    Sig: Place 1 tablet (0.4 mg total) under the tongue every 5 (five) minutes as needed for chest pain.    Dispense:  25 tablet    Refill:  3   Signed, Ermalinda Barrios, PA-C  01/19/2023 8:53 AM    [facility] [address], [place], [place]  [address] Phone: [phone]; Fax: [phone]

## 2023-01-19 ENCOUNTER — Ambulatory Visit: Payer: BC Managed Care – PPO | Attending: Physician Assistant | Admitting: Physician Assistant

## 2023-01-19 ENCOUNTER — Telehealth: Payer: Self-pay | Admitting: *Deleted

## 2023-01-19 ENCOUNTER — Encounter: Payer: Self-pay | Admitting: Physician Assistant

## 2023-01-19 VITALS — BP 120/90 | HR 64 | Ht 67.0 in | Wt 186.0 lb

## 2023-01-19 DIAGNOSIS — I236 Thrombosis of atrium, auricular appendage, and ventricle as current complications following acute myocardial infarction: Secondary | ICD-10-CM

## 2023-01-19 DIAGNOSIS — I471 Supraventricular tachycardia, unspecified: Secondary | ICD-10-CM

## 2023-01-19 DIAGNOSIS — I10 Essential (primary) hypertension: Secondary | ICD-10-CM

## 2023-01-19 DIAGNOSIS — I493 Ventricular premature depolarization: Secondary | ICD-10-CM

## 2023-01-19 DIAGNOSIS — I4729 Other ventricular tachycardia: Secondary | ICD-10-CM

## 2023-01-19 DIAGNOSIS — I2510 Atherosclerotic heart disease of native coronary artery without angina pectoris: Secondary | ICD-10-CM | POA: Diagnosis not present

## 2023-01-19 DIAGNOSIS — R0683 Snoring: Secondary | ICD-10-CM

## 2023-01-19 DIAGNOSIS — I5042 Chronic combined systolic (congestive) and diastolic (congestive) heart failure: Secondary | ICD-10-CM

## 2023-01-19 MED ORDER — NITROGLYCERIN 0.4 MG SL SUBL: 0.4000 mg | SUBLINGUAL_TABLET | SUBLINGUAL | 3 refills | Status: DC | PRN

## 2023-01-19 MED ORDER — RIVAROXABAN 20 MG PO TABS
20.0000 mg | ORAL_TABLET | Freq: Every day | ORAL | 11 refills | Status: DC
Start: 2023-01-19 — End: 2023-03-22

## 2023-01-19 NOTE — Telephone Encounter (Signed)
Prior Authorization for [facility] sent to [facility] via Phone. Reference # .  READY- NO PA REQ-CALL REF# [id number]

## 2023-01-19 NOTE — Patient Instructions (Signed)
Medication Instructions:   Your physician recommends that you continue on your current medications as directed. Please refer to the Current Medication list given to you today.   *If you need a refill on your cardiac medications before your next appointment, please call your pharmacy*   Lab Work:  None ordered.  If you have labs (blood work) drawn today and your tests are completely normal, you will receive your results only by: [place] (if you have MyChart) OR A paper copy in the mail If you have any lab test that is abnormal or we need to change your treatment, we will call you to review the results.   Testing/Procedures:  Your physician has recommended that you have a sleep study. This test records several body functions during sleep, including: brain activity, eye movement, oxygen and carbon dioxide blood levels, heart rate and rhythm, breathing rate and rhythm, the flow of air through your mouth and nose, snoring, body muscle movements, and chest and belly movement.  Your physician has requested that you have an echocardiogram. Echocardiography is a painless test that uses sound waves to create images of your heart. It provides your doctor with information about the size and shape of your heart and how well your heart's chambers and valves are working. This procedure takes approximately one hour. There are no restrictions for this procedure. Please do NOT wear cologne,aftershave, or lotions (deodorant is allowed). Please arrive 15 minutes prior to your appointment time.  Follow-Up: At [facility], you and your health needs are our priority.  As part of our continuing mission to provide you with exceptional heart care, we have created designated Provider Care Teams.  These Care Teams include your primary Cardiologist (physician) and Advanced Practice Providers (APPs -  Physician Assistants and Nurse Practitioners) who all work together to provide you with the care you  need, when you need it.  We recommend signing up for the patient portal called "MyChart".  Sign up information is provided on this After Visit Summary.  MyChart is used to connect with patients for Virtual Visits (Telemedicine).  Patients are able to view lab/test results, encounter notes, upcoming appointments, etc.  Non-urgent messages can be sent to your provider as well.   To learn more about what you can do with MyChart, go to [email or web address].    Your next appointment:   1 year(s)  Provider:   Lauree Chandler, MD     Other Instructions  Your physician wants you to follow-up in: 1 year with Dr. Angelena Form. You will receive a reminder letter in the mail two months in advance. If you don't receive a letter, please call our office to schedule the follow-up appointment.  Please try and exercise 150 minutes each week.  Please keep a check on your blood pressure.  Blood Pressure Record Sheet To take your blood pressure, you will need a blood pressure machine. You may be prescribed one, or you can buy a blood pressure machine (blood pressure monitor) at your clinic, drug store, or online. When choosing one, look for these features: An automatic monitor that has an arm cuff. A cuff that wraps snugly, but not too tightly, around your upper arm. You should be able to fit only one finger between your arm and the cuff. A device that stores blood pressure reading results. Do not choose a monitor that measures your blood pressure from your wrist or finger. Follow your health care provider's instructions for how to take your blood pressure. To  use this form: Get one reading in the morning (a.m.) before you take any medicines. Get one reading in the evening (p.m.) before supper. Take at least two readings with each blood pressure check. This makes sure the results are correct. Wait 1-2 minutes between measurements. Write down the results in the spaces on this form. Repeat this once a  week, or as told by your health care provider. Make a follow-up appointment with your health care provider to discuss the results. Blood pressure log Date: _______________________ a.m. _____________________(1st reading) _____________________(2nd reading) p.m. _____________________(1st reading) _____________________(2nd reading) Date: _______________________ a.m. _____________________(1st reading) _____________________(2nd reading) p.m. _____________________(1st reading) _____________________(2nd reading) Date: _______________________ a.m. _____________________(1st reading) _____________________(2nd reading) p.m. _____________________(1st reading) _____________________(2nd reading) Date: _______________________ a.m. _____________________(1st reading) _____________________(2nd reading) p.m. _____________________(1st reading) _____________________(2nd reading) Date: _______________________ a.m. _____________________(1st reading) _____________________(2nd reading) p.m. _____________________(1st reading) _____________________(2nd reading) This information is not intended to replace advice given to you by your health care provider. Make sure you discuss any questions you have with your health care provider. Document Revised: 07/24/2021 Document Reviewed: 07/24/2021 Elsevier Patient Education  [place].

## 2023-01-19 NOTE — Telephone Encounter (Signed)
Rodney Marquez, [facility] ordered an [facility] sleep study while pt was in the office. Pt agreeable to signed waiver and to not open the box until he has been called with the PIN#.

## 2023-01-21 NOTE — Telephone Encounter (Signed)
Called and made the patient aware that he may proceed with the [facility] Sleep Study. PIN # provided to the patient. Patient made aware that he will be contacted after the test has been read with the results and any recommendations. Patient verbalized understanding and thanked me for the call.    Pt has been given PIN# [id number]. Pt states he will do sleep study one night this week/weekend.

## 2023-01-24 ENCOUNTER — Encounter (INDEPENDENT_AMBULATORY_CARE_PROVIDER_SITE_OTHER): Payer: BC Managed Care – PPO | Admitting: Cardiology

## 2023-01-24 DIAGNOSIS — G4733 Obstructive sleep apnea (adult) (pediatric): Secondary | ICD-10-CM | POA: Diagnosis not present

## 2023-02-08 ENCOUNTER — Ambulatory Visit: Payer: BC Managed Care – PPO | Attending: Physician Assistant

## 2023-02-08 DIAGNOSIS — R0683 Snoring: Secondary | ICD-10-CM

## 2023-02-08 DIAGNOSIS — I4729 Other ventricular tachycardia: Secondary | ICD-10-CM

## 2023-02-08 DIAGNOSIS — I471 Supraventricular tachycardia, unspecified: Secondary | ICD-10-CM

## 2023-02-08 DIAGNOSIS — I5042 Chronic combined systolic (congestive) and diastolic (congestive) heart failure: Secondary | ICD-10-CM

## 2023-02-08 DIAGNOSIS — I10 Essential (primary) hypertension: Secondary | ICD-10-CM

## 2023-02-08 DIAGNOSIS — I236 Thrombosis of atrium, auricular appendage, and ventricle as current complications following acute myocardial infarction: Secondary | ICD-10-CM

## 2023-02-08 DIAGNOSIS — I2510 Atherosclerotic heart disease of native coronary artery without angina pectoris: Secondary | ICD-10-CM

## 2023-02-08 DIAGNOSIS — I493 Ventricular premature depolarization: Secondary | ICD-10-CM

## 2023-02-08 NOTE — Procedures (Signed)
SLEEP STUDY REPORT Patient Information Study Date: 01/24/2023 Patient Name: Rodney Marquez Patient ID: [id number] Birth Date: [birth year] Age: 57 Gender: Male BMI: 29.1 (W=185 lb, H=5' 7'') Stopbang: 6 Referring Physician: Ermalinda Barrios, PA  TEST DESCRIPTION: Home sleep apnea testing was completed using the WatchPat, a Type 1 device, utilizing peripheral arterial tonometry (PAT), chest movement, actigraphy, pulse oximetry, pulse rate, body position and snore.  AHI was calculated with apnea and hypopnea using valid sleep time as the denominator. RDI includes apneas, hypopneas, and RERAs.  The data acquired and the scoring of sleep and all associated events were performed in accordance with the recommended standards and specifications as outlined in the AASM Manual for the Scoring of Sleep and Associated Events 2.2.0 (2015).  FINDINGS:  1.  Severe Obstructive Sleep Apnea with AHI 29/hr.   2.  No Central Sleep Apnea with pAHIc 2.2/hr.  3.  Oxygen desaturations as low as 80%.  4.  Mild to moderate snoring was present. O2 sats were < 88% for 6.5 min.  5.  Total sleep time was 6 hrs and 4 min.  6.  30.7% of total sleep time was spent in REM sleep.   7.  Shortened sleep onset latency at 5 min.   8.  Shortened REM sleep onset latency at 45 min.   9.  Total awakenings were 10.  10. Arrhythmia detection:  None  DIAGNOSIS:   Severe Obstructive Sleep Apnea (G47.33)  Nocturnal Hypoxemia  RECOMMENDATIONS:   1.  Clinical correlation of these findings is necessary.  The decision to treat obstructive sleep apnea (OSA) is usually based on the presence of apnea symptoms or the presence of associated medical conditions such as Hypertension, Congestive Heart Failure, Atrial Fibrillation or Obesity.  The most common symptoms of OSA are snoring, gasping for breath while sleeping, daytime sleepiness and fatigue.   2.  Initiating apnea therapy is recommended given the presence of symptoms and/or associated  conditions. Recommend proceeding with one of the following:     a.  Auto-CPAP therapy with a pressure range of 5-20cm H2O.     b.  An oral appliance (OA) that can be obtained from certain dentists with expertise in sleep medicine.  These are primarily of use in non-obese patients with mild and moderate disease.     c.  An ENT consultation which may be useful to look for specific causes of obstruction and possible treatment options.     d.  If patient is intolerant to PAP therapy, consider referral to ENT for evaluation for hypoglossal nerve stimulator.   3.  Close follow-up is necessary to ensure success with CPAP or oral appliance therapy for maximum benefit.  4.  A follow-up oximetry study on CPAP is recommended to assess the adequacy of therapy and determine the need for supplemental oxygen or the potential need for Bi-level therapy.  An arterial blood gas to determine the adequacy of baseline ventilation and oxygenation should also be considered.  5.  Healthy sleep recommendations include:  adequate nightly sleep (normal 7-9 hrs/night), avoidance of caffeine after noon and alcohol near bedtime, and maintaining a sleep environment that is cool, dark and quiet.  6.  Weight loss for overweight patients is recommended.  Even modest amounts of weight loss can significantly improve the severity of sleep apnea.  7.  Snoring recommendations include:  weight loss where appropriate, side sleeping, and avoidance of alcohol before bed.  8.  Operation of motor vehicle should be avoided when sleepy.  Signature: Fransico Him, MD; [facility]; Diplomat, American Board of Sleep Medicine Electronically Signed: 02/08/2023

## 2023-02-16 ENCOUNTER — Ambulatory Visit (HOSPITAL_COMMUNITY): Payer: BC Managed Care – PPO | Attending: Cardiology

## 2023-02-16 DIAGNOSIS — I2510 Atherosclerotic heart disease of native coronary artery without angina pectoris: Secondary | ICD-10-CM | POA: Insufficient documentation

## 2023-02-16 DIAGNOSIS — I493 Ventricular premature depolarization: Secondary | ICD-10-CM | POA: Insufficient documentation

## 2023-02-16 DIAGNOSIS — R0683 Snoring: Secondary | ICD-10-CM | POA: Diagnosis not present

## 2023-02-16 DIAGNOSIS — I4729 Other ventricular tachycardia: Secondary | ICD-10-CM | POA: Insufficient documentation

## 2023-02-16 DIAGNOSIS — I10 Essential (primary) hypertension: Secondary | ICD-10-CM | POA: Insufficient documentation

## 2023-02-16 DIAGNOSIS — I5042 Chronic combined systolic (congestive) and diastolic (congestive) heart failure: Secondary | ICD-10-CM | POA: Insufficient documentation

## 2023-02-16 DIAGNOSIS — I471 Supraventricular tachycardia, unspecified: Secondary | ICD-10-CM | POA: Diagnosis not present

## 2023-02-16 DIAGNOSIS — I236 Thrombosis of atrium, auricular appendage, and ventricle as current complications following acute myocardial infarction: Secondary | ICD-10-CM | POA: Insufficient documentation

## 2023-02-16 DIAGNOSIS — I428 Other cardiomyopathies: Secondary | ICD-10-CM | POA: Diagnosis not present

## 2023-02-16 MED ORDER — PERFLUTREN LIPID MICROSPHERE
3.0000 mL | INTRAVENOUS | Status: AC | PRN
  Administered 2023-02-16: 3 mL via INTRAVENOUS

## 2023-02-17 ENCOUNTER — Telehealth: Payer: Self-pay | Admitting: *Deleted

## 2023-02-17 DIAGNOSIS — I2510 Atherosclerotic heart disease of native coronary artery without angina pectoris: Secondary | ICD-10-CM

## 2023-02-17 DIAGNOSIS — R0683 Snoring: Secondary | ICD-10-CM

## 2023-02-17 DIAGNOSIS — I5042 Chronic combined systolic (congestive) and diastolic (congestive) heart failure: Secondary | ICD-10-CM

## 2023-02-17 DIAGNOSIS — I10 Essential (primary) hypertension: Secondary | ICD-10-CM

## 2023-02-17 DIAGNOSIS — G4733 Obstructive sleep apnea (adult) (pediatric): Secondary | ICD-10-CM

## 2023-02-17 LAB — ECHOCARDIOGRAM COMPLETE
Area-P 1/2: 2.95 cm2
S' Lateral: 3.7 cm

## 2023-02-17 NOTE — Telephone Encounter (Signed)
The patient has been notified of the result. Left detailed message on voicemail and informed patient to call back..Charmion Hapke Green, CMA   

## 2023-02-17 NOTE — Telephone Encounter (Signed)
-----   Message from Lauralee Evener, [place] sent at 02/08/2023  8:29 AM EDT -----  ----- Message ----- From: Sueanne Margarita, MD Sent: 02/08/2023   8:22 AM EDT To: Cv Div Sleep Studies  Please let patient know that they have sleep apnea.  Recommend therapeutic CPAP titration ASAP for treatment of patient's sleep disordered breathing.  If unable to perform an in lab titration then initiate ResMed auto CPAP from 4 to 15cm H2O with heated humidity and mask of choice and overnight pulse ox on CPAP.

## 2023-02-18 NOTE — Addendum Note (Signed)
Addended by: Freada Bergeron on: 02/18/2023 12:22 PM   Modules accepted: Orders

## 2023-02-18 NOTE — Telephone Encounter (Signed)
RETURN CALL: The patient has been notified of the result and verbalized understanding.  All questions (if any) were answered. Rodney Marquez, [place] 02/18/2023 [id number] PM    WILL PRECERT TITRATION

## 2023-02-19 ENCOUNTER — Telehealth: Payer: Self-pay

## 2023-02-19 DIAGNOSIS — I513 Intracardiac thrombosis, not elsewhere classified: Secondary | ICD-10-CM

## 2023-02-19 MED ORDER — RIVAROXABAN (XARELTO) VTE STARTER PACK (15 & 20 MG): ORAL_TABLET | ORAL | 0 refills | Status: DC

## 2023-02-19 NOTE — Telephone Encounter (Signed)
-----   Message from Jolaine Artist, MD sent at 02/18/2023  5:28 PM EDT ----- Has he been complaint with Xarelto? If so, certainly worth considering switch to coumadin. There has been data on both sides of this but the clot was impressive.    ----- Message ----- From: Burnell Blanks, MD Sent: 02/17/2023   5:40 PM EDT To: Jolaine Artist, MD; Imogene Burn, PA-C; #  Bertell Maria read his echo. I am going to include him in this message to get his input.   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 This seemed to resolve by repeat echo in 2013 but given his apical akinesis and suspicion for LV thrombus in 2016, he was placed on Eliquis. He was changed to Xarelto due to cost of Eliquis recently. With the clear thrombus on echo now, should we change him to coumadin?   Thanks for your input Linna Hoff.  Gerald Stabs

## 2023-02-19 NOTE — Telephone Encounter (Signed)
Spoke with pt and advised of echo result per provider.  Pt states he has not taken Xarelto for about 3 months due to cost increasing. Pt advised per Fuller [place], [place] he will need to start on Xarelto 15mg  - 1 tablet twice daily with food for 21 days then begin Xarelto 20mg  daily. Pt advised starter pack for this dosage has been sent to [facility] on [address].  Provided phone number [phone] for Xarelto $10 copay card.  Pt advised he will need a repeat echo in 3 months.  Pt verbalizes understanding and agrees with current plan.

## 2023-02-22 ENCOUNTER — Telehealth: Payer: Self-pay | Admitting: Cardiovascular Disease

## 2023-02-22 NOTE — Telephone Encounter (Signed)
Pt called stating the pharmacy filled his Xarelto today and it will cost around $500. He did contact Xarelto patient assistance today however, the recording mentioned the wait time is 2 hours and the patient did not wait to speak with someone. Advised pt he should call the phone number again and speak with someone for $10 co-pay card. Patient stated he will try to contact them again, will forward to APP and Pharm D.

## 2023-02-22 NOTE — Telephone Encounter (Signed)
Pt c/o medication issue:  1. Name of Medication:   rivaroxaban (XARELTO) 20 MG TABS tablet   2. How are you currently taking this medication (dosage and times per day)?   Not taking yet  3. Are you having a reaction (difficulty breathing--STAT)?   N/A  4. What is your medication issue?   Patient stated the phone wait time to get the $10 co-pay card is two hours.  Patient stated this medication is very expensive and he wants assistance getting the medication.

## 2023-02-23 ENCOUNTER — Other Ambulatory Visit (HOSPITAL_COMMUNITY): Payer: Self-pay

## 2023-02-23 ENCOUNTER — Telehealth: Payer: Self-pay | Admitting: Physician Assistant

## 2023-02-23 NOTE — Telephone Encounter (Signed)
Pt stated he received a phone call on 3/29 and was informed that "he has a blood clot in his heart".He has started taking Xarelto however, pt still feels a little tired and his heart racing. Pt is requesting a work note to be excused from work for the rest of this week. Pt stated he "will like to give the medication time to work". Will forward to APP.

## 2023-02-23 NOTE — Telephone Encounter (Signed)
He can sign up online [email or web address]  I was able to do it for pt and give him info. He paid for the medication last night. Advised to call pharmacy and see if they would re-run it and refund him the difference.  Emilia Beck  Medication  [facility] #  [id number]  Group #  Etheleen Sia  ID  [id number]  PCN #  [id number]

## 2023-02-23 NOTE — Telephone Encounter (Signed)
  Pt c/o medication issue:  1. Name of Medication: xarelto  2. How are you currently taking this medication (dosage and times per day)?   3. Are you having a reaction (difficulty breathing--STAT)?   4. What is your medication issue? Pt would like to ask if he can stopped taking this medication for a week since he is having mild reaction to it

## 2023-02-24 NOTE — Telephone Encounter (Signed)
Symptoms do not sound like side effects of Xarelto. Would recommend he continue on therapy. He should not hold med for a week given active thrombus.

## 2023-02-25 NOTE — Telephone Encounter (Signed)
The patient has been notified of the result and verbalized understanding.  All questions (if any) were answered. Gershon Crane, LPN [id number]

## 2023-03-22 ENCOUNTER — Other Ambulatory Visit: Payer: Self-pay | Admitting: Physician Assistant

## 2023-03-22 DIAGNOSIS — I236 Thrombosis of atrium, auricular appendage, and ventricle as current complications following acute myocardial infarction: Secondary | ICD-10-CM

## 2023-03-22 MED ORDER — RIVAROXABAN 20 MG PO TABS: 20.0000 mg | ORAL_TABLET | Freq: Every day | ORAL | 11 refills | Status: DC

## 2023-04-04 ENCOUNTER — Ambulatory Visit (HOSPITAL_BASED_OUTPATIENT_CLINIC_OR_DEPARTMENT_OTHER): Payer: BC Managed Care – PPO | Attending: Cardiology | Admitting: Cardiology

## 2023-04-04 VITALS — Ht 67.0 in | Wt 184.0 lb

## 2023-04-04 DIAGNOSIS — G4733 Obstructive sleep apnea (adult) (pediatric): Secondary | ICD-10-CM

## 2023-04-04 DIAGNOSIS — I5042 Chronic combined systolic (congestive) and diastolic (congestive) heart failure: Secondary | ICD-10-CM | POA: Insufficient documentation

## 2023-04-04 DIAGNOSIS — R0683 Snoring: Secondary | ICD-10-CM | POA: Insufficient documentation

## 2023-04-04 DIAGNOSIS — I2510 Atherosclerotic heart disease of native coronary artery without angina pectoris: Secondary | ICD-10-CM | POA: Insufficient documentation

## 2023-04-04 DIAGNOSIS — I110 Hypertensive heart disease with heart failure: Secondary | ICD-10-CM | POA: Insufficient documentation

## 2023-04-05 NOTE — Procedures (Signed)
   Patient Name: Rodney, Marquez Date: 04/04/2023 Gender: Male D.O.B: [birth year] Age (years): 63 Referring Provider: Armanda Magic MD, ABSM Height (inches): 67 Interpreting Physician: Armanda Magic MD, ABSM Weight (lbs): 184 RPSGT: Rosette Reveal BMI: 29 MRN: [id number] Neck Size: 17.00  CLINICAL INFORMATION The patient is referred for a CPAP titration to treat sleep apnea.  SLEEP STUDY TECHNIQUE As per the AASM Manual for the Scoring of Sleep and Associated Events v2.3 (April 2016) with a hypopnea requiring 4% desaturations.  The channels recorded and monitored were frontal, central and occipital EEG, electrooculogram (EOG), submentalis EMG (chin), nasal and oral airflow, thoracic and abdominal wall motion, anterior tibialis EMG, snore microphone, electrocardiogram, and pulse oximetry. Continuous positive airway pressure (CPAP) was initiated at the beginning of the study and titrated to treat sleep-disordered breathing.  MEDICATIONS Medications self-administered by patient taken the night of the study : CLONAZEPAM  TECHNICIAN COMMENTS Comments added by technician: None Comments added by scorer: N/A  RESPIRATORY PARAMETERS Optimal PAP Pressure (cm): 14  AHI at Optimal Pressure (/hr):0 Overall Minimal O2 (%):93.0  Supine % at Optimal Pressure (%):31 Minimal O2 at Optimal Pressure (%): 94.0   SLEEP ARCHITECTURE The study was initiated at 10:53:14 PM and ended at 5:06:30 AM.  Sleep onset time was 9.0 minutes and the sleep efficiency was 86.5%. The total sleep time was 322.8 minutes.  The patient spent 6.0% of the night in stage N1 sleep, 62.4% in stage N2 sleep, 0.0% in stage N3 and 31.6% in REM.Stage REM latency was 174.0 minutes  Wake after sleep onset was 41.5. Alpha intrusion was absent. Supine sleep was 28.65%.  CARDIAC DATA The 2 lead EKG demonstrated sinus rhythm. The mean heart rate was 51.2 beats per minute. Other EKG findings include: None.  LEG MOVEMENT  DATA The total Periodic Limb Movements of Sleep (PLMS) were 0. The PLMS index was 0.0. A PLMS index of <15 is considered normal in adults.  IMPRESSIONS - The optimal PAP pressure was 14 cm of water. - Significant oxygen desaturations were not observed during this titration (min O2 = 93.0%). - The patient snored with loud snoring volume during this titration study. - No cardiac abnormalities were observed during this study. - Clinically significant periodic limb movements were not noted during this study. Arousals associated with PLMs were rare.  DIAGNOSIS - Obstructive Sleep Apnea (G47.33)  RECOMMENDATIONS - Trial of ResMed CPAP therapy on 14 cm H2O with a Medium size Fisher&Paykel Full Face Simplus mask and heated humidification. - Avoid alcohol, sedatives and other CNS depressants that may worsen sleep apnea and disrupt normal sleep architecture. - Sleep hygiene should be reviewed to assess factors that may improve sleep quality. - Weight management and regular exercise should be initiated or continued. - Return to Sleep Center for re-evaluation after 4 weeks of therapy  [Electronically signed] 04/05/2023 09:32 AM  Armanda Magic MD, ABSM Diplomate, American Board of Sleep Medicine

## 2023-04-07 ENCOUNTER — Other Ambulatory Visit: Payer: Self-pay | Admitting: Physician Assistant

## 2023-04-07 DIAGNOSIS — I236 Thrombosis of atrium, auricular appendage, and ventricle as current complications following acute myocardial infarction: Secondary | ICD-10-CM

## 2023-04-07 MED ORDER — RIVAROXABAN 20 MG PO TABS: 20.0000 mg | ORAL_TABLET | Freq: Every day | ORAL | 5 refills | Status: DC

## 2023-04-07 NOTE — Telephone Encounter (Signed)
Prescription refill request for Xarelto received.  Indication: LV apical thrombus  Last office visit: 01/19/23 Geni Bers)  Weight:83.5kg Age: 57 Scr: 1.11 (05/19/22 via [facility])  CrCl: 89.81ml/min  Appropriate dose. Refill sent.

## 2023-04-27 ENCOUNTER — Telehealth: Payer: Self-pay

## 2023-04-27 NOTE — Telephone Encounter (Signed)
-----   Message from Quintella Reichert, MD sent at 04/05/2023  9:34 AM EDT ----- Please let patient know that they had a successful PAP titration and let DME know that orders are in EPIC.  Please set up 6 week OV with me.

## 2023-04-27 NOTE — Telephone Encounter (Signed)
Left detailed VM, per DPR. CPAP machine and supplies ordered today, 04/27/23, through [facility].

## 2023-05-21 ENCOUNTER — Ambulatory Visit (HOSPITAL_COMMUNITY)
Admission: RE | Admit: 2023-05-21 | Discharge: 2023-05-21 | Disposition: A | Discharge status: Home / Self Care | Payer: BC Managed Care – PPO | Source: Ambulatory Visit | Attending: Physician Assistant | Admitting: Physician Assistant

## 2023-05-21 ENCOUNTER — Ambulatory Visit (HOSPITAL_COMMUNITY): Payer: BC Managed Care – PPO

## 2023-05-21 DIAGNOSIS — E785 Hyperlipidemia, unspecified: Secondary | ICD-10-CM | POA: Diagnosis not present

## 2023-05-21 DIAGNOSIS — I513 Intracardiac thrombosis, not elsewhere classified: Secondary | ICD-10-CM | POA: Diagnosis not present

## 2023-05-21 DIAGNOSIS — I10 Essential (primary) hypertension: Secondary | ICD-10-CM | POA: Diagnosis not present

## 2023-05-21 LAB — ECHOCARDIOGRAM COMPLETE
Area-P 1/2: 3.99 cm2
S' Lateral: 3.3 cm

## 2023-05-21 NOTE — Progress Notes (Signed)
  Echocardiogram 2D Echocardiogram has been performed.  Rodney Marquez 05/21/2023, 2:06 PM

## 2023-05-23 ENCOUNTER — Other Ambulatory Visit: Payer: Self-pay | Admitting: Physician Assistant

## 2023-06-01 DIAGNOSIS — Z23 Encounter for immunization: Secondary | ICD-10-CM | POA: Diagnosis not present

## 2023-06-01 DIAGNOSIS — I5042 Chronic combined systolic (congestive) and diastolic (congestive) heart failure: Secondary | ICD-10-CM | POA: Diagnosis not present

## 2023-06-01 DIAGNOSIS — Z0000 Encounter for general adult medical examination without abnormal findings: Secondary | ICD-10-CM | POA: Diagnosis not present

## 2023-06-01 DIAGNOSIS — E1169 Type 2 diabetes mellitus with other specified complication: Secondary | ICD-10-CM | POA: Diagnosis not present

## 2023-06-01 DIAGNOSIS — I10 Essential (primary) hypertension: Secondary | ICD-10-CM | POA: Diagnosis not present

## 2023-06-01 DIAGNOSIS — Z125 Encounter for screening for malignant neoplasm of prostate: Secondary | ICD-10-CM | POA: Diagnosis not present

## 2023-06-01 DIAGNOSIS — E782 Mixed hyperlipidemia: Secondary | ICD-10-CM | POA: Diagnosis not present

## 2023-06-07 ENCOUNTER — Other Ambulatory Visit (HOSPITAL_COMMUNITY): Payer: BC Managed Care – PPO

## 2023-07-11 ENCOUNTER — Other Ambulatory Visit: Payer: Self-pay | Admitting: Physician Assistant

## 2023-07-12 NOTE — Telephone Encounter (Addendum)
Xarelto starter pack refill received. Pt is not on starter pack any longer and should be on maintenance xarelto 20mg  every day (please refer to 02/19/23 notes).  Last xarelto 20mg  refill sent 04/07/23  Xarelto 20mg  refill request received. Pt is 57 years old, weight-83.5kg, Crea-1.03 on 06/01/2023 via [facility] from [place], last seen by Jacolyn Reedy on 01/19/23, Diagnosis-Apical thrombus, CrCl- 95.71 mL/min; Dose is appropriate based on dosing criteria.   Will call pharmacy to ensure they have the maintenance dose xarelto 20mg  on file.   Spoke with Freida Busman at the pharmacy and inquired about pts xarelto dose and he states they do have the xarelto 20mg  on file and will delete the started pack. He stated the xarelto 20mg  does have refills. Will deny xarelto started pack refill at this time.

## 2023-07-20 ENCOUNTER — Other Ambulatory Visit: Payer: Self-pay

## 2023-07-20 DIAGNOSIS — I236 Thrombosis of atrium, auricular appendage, and ventricle as current complications following acute myocardial infarction: Secondary | ICD-10-CM

## 2023-07-20 MED ORDER — RIVAROXABAN 20 MG PO TABS
20.0000 mg | ORAL_TABLET | Freq: Every day | ORAL | 1 refills | Status: DC
Start: 2023-07-20 — End: 2023-12-29

## 2023-07-20 NOTE — Telephone Encounter (Signed)
Prescription refill request for Xarelto received.  Indication: PE Last office visit: 01/19/23 Geni Bers)  Weight: 83.5kg Age: 57 Scr: 1.03 (06/01/23 via [facility])  CrCl: 95.42ml/min  Appropriate dose. Refill sent.

## 2023-11-26 ENCOUNTER — Other Ambulatory Visit: Payer: Self-pay

## 2023-11-26 DIAGNOSIS — G4733 Obstructive sleep apnea (adult) (pediatric): Secondary | ICD-10-CM

## 2023-12-01 NOTE — Progress Notes (Signed)
 New order placed per DME for CPAP machine and supplies

## 2023-12-02 DIAGNOSIS — E119 Type 2 diabetes mellitus without complications: Secondary | ICD-10-CM | POA: Diagnosis not present

## 2023-12-02 DIAGNOSIS — E1169 Type 2 diabetes mellitus with other specified complication: Secondary | ICD-10-CM | POA: Diagnosis not present

## 2023-12-02 DIAGNOSIS — I2510 Atherosclerotic heart disease of native coronary artery without angina pectoris: Secondary | ICD-10-CM | POA: Diagnosis not present

## 2023-12-02 DIAGNOSIS — I236 Thrombosis of atrium, auricular appendage, and ventricle as current complications following acute myocardial infarction: Secondary | ICD-10-CM | POA: Diagnosis not present

## 2023-12-02 DIAGNOSIS — I10 Essential (primary) hypertension: Secondary | ICD-10-CM | POA: Diagnosis not present

## 2023-12-29 ENCOUNTER — Other Ambulatory Visit: Payer: Self-pay | Admitting: Physician Assistant

## 2023-12-29 DIAGNOSIS — I236 Thrombosis of atrium, auricular appendage, and ventricle as current complications following acute myocardial infarction: Secondary | ICD-10-CM

## 2023-12-29 MED ORDER — RIVAROXABAN 20 MG PO TABS: 20.0000 mg | ORAL_TABLET | Freq: Every day | ORAL | 1 refills | Status: DC

## 2023-12-29 NOTE — Telephone Encounter (Signed)
 Prescription refill request for Xarelto  received.  Indication:  Last office visit: 01/19/23 Havery Lions)  Weight: 83.5kg Age: 58 Scr: 1.03 (06/01/23 via [facility])  CrCl: 95.7ml/min  Appropriate dose. Refill sent.

## 2024-01-06 ENCOUNTER — Encounter (HOSPITAL_COMMUNITY): Payer: Self-pay | Admitting: *Deleted

## 2024-01-06 ENCOUNTER — Emergency Department (HOSPITAL_COMMUNITY)
Admission: EM | Admit: 2024-01-06 | Discharge: 2024-01-06 | Disposition: A | Discharge status: Home / Self Care | Payer: BC Managed Care – PPO | Attending: Emergency Medicine | Admitting: Emergency Medicine

## 2024-01-06 ENCOUNTER — Other Ambulatory Visit: Payer: Self-pay

## 2024-01-06 ENCOUNTER — Emergency Department (HOSPITAL_COMMUNITY): Payer: BC Managed Care – PPO

## 2024-01-06 DIAGNOSIS — I509 Heart failure, unspecified: Secondary | ICD-10-CM | POA: Diagnosis not present

## 2024-01-06 DIAGNOSIS — Z7901 Long term (current) use of anticoagulants: Secondary | ICD-10-CM | POA: Insufficient documentation

## 2024-01-06 DIAGNOSIS — E876 Hypokalemia: Secondary | ICD-10-CM | POA: Insufficient documentation

## 2024-01-06 DIAGNOSIS — R002 Palpitations: Secondary | ICD-10-CM | POA: Diagnosis not present

## 2024-01-06 DIAGNOSIS — I499 Cardiac arrhythmia, unspecified: Secondary | ICD-10-CM | POA: Diagnosis not present

## 2024-01-06 LAB — TROPONIN I (HIGH SENSITIVITY)
Troponin I (High Sensitivity): 11 ng/L (ref <18)
Troponin I (High Sensitivity): 11 ng/L (ref <18)

## 2024-01-06 LAB — CBC
HCT: 41.9 % (ref 39.0–52.0)
Hemoglobin: 14.7 g/dL (ref 13.0–17.0)
MCH: 33.9 pg (ref 26.0–34.0)
MCHC: 35.1 g/dL (ref 30.0–36.0)
MCV: 96.5 fL (ref 80.0–100.0)
Platelets: 191 10*3/uL (ref 150–400)
RBC: 4.34 MIL/uL (ref 4.22–5.81)
RDW: 13.2 % (ref 11.5–15.5)
WBC: 4.4 10*3/uL (ref 4.0–10.5)
nRBC: 0 % (ref 0.0–0.2)

## 2024-01-06 LAB — BASIC METABOLIC PANEL
Anion gap: 10 (ref 5–15)
BUN: 11 mg/dL (ref 6–20)
CO2: 27 mmol/L (ref 22–32)
Calcium: 9.1 mg/dL (ref 8.9–10.3)
Chloride: 101 mmol/L (ref 98–111)
Creatinine, Ser: 1.05 mg/dL (ref 0.61–1.24)
GFR, Estimated: >60 mL/min (ref >60)
Glucose, Bld: 190 mg/dL — ABNORMAL HIGH (ref 70–99)
Potassium: 3.2 mmol/L — ABNORMAL LOW (ref 3.5–5.1)
Sodium: 138 mmol/L (ref 135–145)

## 2024-01-06 LAB — TSH: TSH: 2.052 u[IU]/mL (ref 0.350–4.500)

## 2024-01-06 LAB — MAGNESIUM: Magnesium: 2.1 mg/dL (ref 1.7–2.4)

## 2024-01-06 LAB — T4, FREE: Free T4: 1.11 ng/dL (ref 0.61–1.12)

## 2024-01-06 MED ORDER — POTASSIUM CHLORIDE 20 MEQ PO PACK
40.0000 meq | PACK | ORAL | Status: AC
  Administered 2024-01-06: 40 meq via ORAL
  Filled 2024-01-06: qty 2

## 2024-01-06 NOTE — Discharge Instructions (Addendum)
Your blood work today is reassuring. Please follow-up with your PCP and the cardiology clinic. We have placed a referral. Return to the ED if you shortness of breath, chest pain, or swelling of an extremity.

## 2024-01-06 NOTE — ED Provider Notes (Signed)
[place] EMERGENCY DEPARTMENT AT [facility] Provider Note   CSN: [id number] Arrival date & time: 01/06/24  0210     History  Chief Complaint  Patient presents with   Irregular Heart Beat    Rodney Marquez is a 58 y.o. male, hx of NSVT, STEMI, CHF, who presents to the ED 2/2 to palpitations, that have been going on for the last 3 days.  He states the palpitations have been intervally going on for last 3 days, lasting for 2 to 3 hours at a time, are not associated with any shortness of breath or chest pain.  States they come and go, but yesterday afternoon, the palpitations were more persistent, and so he decided to come to the ED.  He reports he has been compliant with his Xarelto, and is worried, as he has had a PE before and a LV thrombus.  Denies any recent increases in caffeine intake, trauma, or recent surgeries. Home Medications Prior to Admission medications   Medication Sig Start Date End Date Taking? Authorizing Provider  acetaminophen (TYLENOL) 325 MG tablet Take 650 mg by mouth every 6 (six) hours as needed for mild pain.    [provider]  amLODipine (NORVASC) 5 MG tablet Take 1 tablet (5 mg total) by mouth daily. 03/20/21   Dunn, Tacey Ruiz, PA-C  atorvastatin (LIPITOR) 80 MG tablet Take 1 tablet (80 mg total) by mouth daily. 12/13/20   Dunn, Tacey Ruiz, PA-C  carvedilol (COREG) 12.5 MG tablet Take 1 tablet (12.5 mg total) by mouth 2 (two) times daily with a meal. 10/30/16   Dunn, Dayna N, PA-C  clonazePAM (KLONOPIN) 1 MG tablet Take 1 mg by mouth daily. 06/27/17   [provider]  hydrOXYzine (ATARAX/VISTARIL) 25 MG tablet Take 1 tablet (25 mg total) by mouth every 6 (six) hours. 10/31/20   Liberty Handy, PA-C  losartan (COZAAR) 100 MG tablet TAKE 1 TABLET BY MOUTH EVERY DAY 08/30/14   Kathleene Hazel, MD  nitroGLYCERIN (NITROSTAT) 0.4 MG SL tablet Place 1 tablet (0.4 mg total) under the tongue every 5 (five) minutes as needed for chest pain.  01/19/23   Dyann Kief, PA-C  potassium chloride SA (KLOR-CON) 20 MEQ tablet Take 2 tablets by mouth once now. 01/03/21   Laurann Montana, PA-C  rivaroxaban (XARELTO) 20 MG TABS tablet Take 1 tablet (20 mg total) by mouth daily with supper. 12/29/23   Kathleene Hazel, MD  sertraline (ZOLOFT) 50 MG tablet Take 0.5 tablets (25 mg total) by mouth daily. 04/28/15   Robbie Lis M, PA-C  sildenafil (VIAGRA) 50 MG tablet Take 50 mg by mouth as needed for erectile dysfunction. 05/09/20   [provider]  spironolactone (ALDACTONE) 25 MG tablet TAKE 1 TABLET BY MOUTH EVERY DAY 11/08/17   Dunn, Dayna N, PA-C  zolpidem (AMBIEN) 5 MG tablet Take 5 mg by mouth at bedtime as needed for sleep. 10/29/20   [provider]      Allergies    Lisinopril    Review of Systems   Review of Systems  Respiratory:  Negative for shortness of breath.   Cardiovascular:  Positive for palpitations. Negative for chest pain.    Physical Exam Updated Vital Signs BP (S) (!) 149/96   Pulse 65   Resp 19   Ht 5\' 7"  (1.702 m)   Wt 83.5 kg   SpO2 97%   BMI 28.83 kg/m  Physical Exam Vitals and nursing note reviewed.  Constitutional:      General: He is not in acute distress.    Appearance: He is well-developed.  HENT:     Head: Normocephalic and atraumatic.  Eyes:     Conjunctiva/sclera: Conjunctivae normal.  Cardiovascular:     Rate and Rhythm: Normal rate and regular rhythm.     Heart sounds: No murmur heard. Pulmonary:     Effort: Pulmonary effort is normal. No respiratory distress.     Breath sounds: Normal breath sounds.  Abdominal:     Palpations: Abdomen is soft.     Tenderness: There is no abdominal tenderness.  Musculoskeletal:        General: No swelling.     Cervical back: Neck supple.  Skin:    General: Skin is warm and dry.     Capillary Refill: Capillary refill takes less than 2 seconds.  Neurological:     Mental Status: He is alert.  Psychiatric:        Mood  and Affect: Mood normal.     ED Results / Procedures / Treatments   Labs (all labs ordered are listed, but only abnormal results are displayed) Labs Reviewed  BASIC METABOLIC PANEL - Abnormal; Notable for the following components:      Result Value   Potassium 3.2 (*)    Glucose, Bld 190 (*)    All other components within normal limits  CBC  T4, FREE  MAGNESIUM  TSH  TROPONIN I (HIGH SENSITIVITY)  TROPONIN I (HIGH SENSITIVITY)    EKG EKG Interpretation Date/Time:  Thursday January 06 2024 03:07:01 EST Ventricular Rate:  76 PR Interval:  156 QRS Duration:  82 QT Interval:  422 QTC Calculation: 474 R Axis:   106  Text Interpretation: Sinus rhythm with occasional Premature ventricular complexes Possible Inferior infarct , age undetermined Anterolateral infarct , age undetermined Abnormal ECG When compared with ECG of 27-Apr-2015 13:46, PREVIOUS ECG IS PRESENT Nonspecific ST abnormality Confirmed by Glynn Octave [phone]) on 01/06/2024 4:08:06 AM  Radiology DG Chest 2 View Result Date: 01/06/2024 CLINICAL DATA:  Irregular heart beat for 3 days. EXAM: CHEST - 2 VIEW COMPARISON:  April 27, 2015 FINDINGS: The heart size and mediastinal contours are within normal limits. Both lungs are clear. The visualized skeletal structures are unremarkable. IMPRESSION: No active cardiopulmonary disease. Electronically Signed   By: Aram Candela M.D.   On: 01/06/2024 03:25    Procedures Procedures    Medications Ordered in ED Medications  potassium chloride (KLOR-CON) packet 40 mEq (40 mEq Oral Given 01/06/24 0459)    ED Course/ Medical Decision Making/ A&P                                 Medical Decision Making Patient is a 58 year old male, here for palpitations, them going for on for the last 3 days.  He denies any shortness of breath or chest pain with them.  Has been compliant with his Xarelto, history of an LV thrombus, as well as a PE, which she is subsequently taken Xarelto  or Eliquis for.  He has not missed any doses of his Xarelto.  Overall well-appearing at this time we will obtain troponins, TSH, magnesium, for further evaluation.  His EKG on exam, shows no arrhythmia  Amount and/or Complexity of Data Reviewed Labs: ordered.    Details: Mild hypokalemia 3.2, troponins within normal limits Discussion of management or test interpretation with external provider(s): Discussed with  cardiology, on-call, Dr. Lendell Caprice, discussed patient's history of thrombus, PE, need for possible echocardiogram inpatient, he recommends outpatient follow-up, for echocardiogram and likely Zio patch.  Patient is well-appearing w/Troponins x2 WNL w/ plan for pt to f/u with cardiology outpatient.   Risk Prescription drug management.   Final Clinical Impression(s) / ED Diagnoses Final diagnoses:  Palpitations    Rx / DC Orders ED Discharge Orders          Ordered    Ambulatory referral to Cardiology       Comments: If you have not heard from the Cardiology office within the next 72 hours please call [phone].   01/06/24 0611              Snow Peoples, Harley [place], PA 01/06/24 5638    Glynn Octave, MD 01/06/24 [phone]

## 2024-01-06 NOTE — ED Triage Notes (Signed)
Patient arriving POV c/o palpitations - intermittent x 3 days.   Hx MI with stent in 2023

## 2024-01-07 ENCOUNTER — Emergency Department (HOSPITAL_COMMUNITY)
Admission: EM | Admit: 2024-01-07 | Discharge: 2024-01-08 | Disposition: A | Discharge status: Home / Self Care | Payer: BC Managed Care – PPO | Attending: Emergency Medicine | Admitting: Emergency Medicine

## 2024-01-07 ENCOUNTER — Emergency Department (HOSPITAL_COMMUNITY): Payer: BC Managed Care – PPO

## 2024-01-07 ENCOUNTER — Encounter (HOSPITAL_COMMUNITY): Payer: Self-pay

## 2024-01-07 DIAGNOSIS — I10 Essential (primary) hypertension: Secondary | ICD-10-CM | POA: Insufficient documentation

## 2024-01-07 DIAGNOSIS — R002 Palpitations: Secondary | ICD-10-CM | POA: Insufficient documentation

## 2024-01-07 DIAGNOSIS — Z79899 Other long term (current) drug therapy: Secondary | ICD-10-CM | POA: Insufficient documentation

## 2024-01-07 DIAGNOSIS — R079 Chest pain, unspecified: Secondary | ICD-10-CM | POA: Diagnosis not present

## 2024-01-07 DIAGNOSIS — I2510 Atherosclerotic heart disease of native coronary artery without angina pectoris: Secondary | ICD-10-CM | POA: Insufficient documentation

## 2024-01-07 LAB — BASIC METABOLIC PANEL
Anion gap: 8 (ref 5–15)
BUN: 8 mg/dL (ref 6–20)
CO2: 26 mmol/L (ref 22–32)
Calcium: 9.2 mg/dL (ref 8.9–10.3)
Chloride: 106 mmol/L (ref 98–111)
Creatinine, Ser: 1.03 mg/dL (ref 0.61–1.24)
GFR, Estimated: >60 mL/min (ref >60)
Glucose, Bld: 107 mg/dL — ABNORMAL HIGH (ref 70–99)
Potassium: 3.5 mmol/L (ref 3.5–5.1)
Sodium: 140 mmol/L (ref 135–145)

## 2024-01-07 LAB — CBC
HCT: 42.4 % (ref 39.0–52.0)
Hemoglobin: 15 g/dL (ref 13.0–17.0)
MCH: 33.3 pg (ref 26.0–34.0)
MCHC: 35.4 g/dL (ref 30.0–36.0)
MCV: 94 fL (ref 80.0–100.0)
Platelets: 190 10*3/uL (ref 150–400)
RBC: 4.51 MIL/uL (ref 4.22–5.81)
RDW: 13.2 % (ref 11.5–15.5)
WBC: 4.5 10*3/uL (ref 4.0–10.5)
nRBC: 0 % (ref 0.0–0.2)

## 2024-01-07 LAB — MAGNESIUM: Magnesium: 2.1 mg/dL (ref 1.7–2.4)

## 2024-01-07 LAB — TROPONIN I (HIGH SENSITIVITY)
Troponin I (High Sensitivity): 10 ng/L (ref <18)
Troponin I (High Sensitivity): 11 ng/L (ref <18)

## 2024-01-07 NOTE — ED Provider Triage Note (Signed)
Emergency Medicine Provider Triage Evaluation Note  Rodney Marquez , a 58 y.o. male  was evaluated in triage.  Pt complains of palpitations.  Patient reports intermittent palpitations for the past several days.  Was seen yesterday for the same complaint.  Denies associated shortness of breath or chest pain.  States that he feels like he develops palpitations when his blood pressure is elevated.  Review of Systems  Positive: Palpitations Negative: Chest pain, shortness of breath  Physical Exam  BP (!) 141/86 (BP Location: Right Arm)   Pulse 72   Temp 98.2 F (36.8 C)   Resp 16   SpO2 100%  Gen:   Awake, no distress   Resp:  Normal effort  MSK:   Moves extremities without difficulty  Other:    Medical Decision Making  Medically screening exam initiated at 8:20 PM.  Appropriate orders placed.  Rodney Marquez was informed that the remainder of the evaluation will be completed by another provider, this initial triage assessment does not replace that evaluation, and the importance of remaining in the ED until their evaluation is complete.    Rodney Marion, PA-C 01/07/24 2028

## 2024-01-07 NOTE — ED Triage Notes (Signed)
Pt states that he feels like his heart is beating irregular and it has been going on all week. Some nausea, denies SOB

## 2024-01-08 ENCOUNTER — Other Ambulatory Visit: Payer: Self-pay

## 2024-01-08 DIAGNOSIS — R002 Palpitations: Secondary | ICD-10-CM | POA: Diagnosis not present

## 2024-01-08 MED ORDER — HYDROXYZINE HCL 25 MG PO TABS
25.0000 mg | ORAL_TABLET | Freq: Once | ORAL | Status: AC
  Administered 2024-01-08: 25 mg via ORAL
  Filled 2024-01-08: qty 1

## 2024-01-08 NOTE — ED Provider Notes (Signed)
[facility] AT [facility] Provider Note   CSN: [id number] Arrival date & time: 01/07/24  1958     History {Add pertinent medical, surgical, social history, OB history to HPI:1} Chief Complaint  Patient presents with   Palpitations    Rodney Marquez is a 58 y.o. male.   Palpitations      Home Medications Prior to Admission medications   Medication Sig Start Date End Date Taking? Authorizing Provider  acetaminophen (TYLENOL) 325 MG tablet Take 650 mg by mouth every 6 (six) hours as needed for mild pain.    [provider]  amLODipine (NORVASC) 5 MG tablet Take 1 tablet (5 mg total) by mouth daily. 03/20/21   Dunn, Tacey Ruiz, PA-C  atorvastatin (LIPITOR) 80 MG tablet Take 1 tablet (80 mg total) by mouth daily. 12/13/20   Dunn, Tacey Ruiz, PA-C  carvedilol (COREG) 12.5 MG tablet Take 1 tablet (12.5 mg total) by mouth 2 (two) times daily with a meal. 10/30/16   Dunn, Dayna N, PA-C  clonazePAM (KLONOPIN) 1 MG tablet Take 1 mg by mouth daily. 06/27/17   [provider]  hydrOXYzine (ATARAX/VISTARIL) 25 MG tablet Take 1 tablet (25 mg total) by mouth every 6 (six) hours. 10/31/20   Liberty Handy, PA-C  losartan (COZAAR) 100 MG tablet TAKE 1 TABLET BY MOUTH EVERY DAY 08/30/14   Kathleene Hazel, MD  nitroGLYCERIN (NITROSTAT) 0.4 MG SL tablet Place 1 tablet (0.4 mg total) under the tongue every 5 (five) minutes as needed for chest pain. 01/19/23   Dyann Kief, PA-C  potassium chloride SA (KLOR-CON) 20 MEQ tablet Take 2 tablets by mouth once now. 01/03/21   Laurann Montana, PA-C  rivaroxaban (XARELTO) 20 MG TABS tablet Take 1 tablet (20 mg total) by mouth daily with supper. 12/29/23   Kathleene Hazel, MD  sertraline (ZOLOFT) 50 MG tablet Take 0.5 tablets (25 mg total) by mouth daily. 04/28/15   Robbie Lis M, PA-C  sildenafil (VIAGRA) 50 MG tablet Take 50 mg by mouth as needed for erectile dysfunction. 05/09/20   [provider]  spironolactone (ALDACTONE) 25 MG tablet TAKE 1 TABLET BY MOUTH EVERY DAY 11/08/17   Dunn, Dayna N, PA-C  zolpidem (AMBIEN) 5 MG tablet Take 5 mg by mouth at bedtime as needed for sleep. 10/29/20   [provider]      Allergies    Lisinopril    Review of Systems   Review of Systems  Cardiovascular:  Positive for palpitations.  Ten systems reviewed and are negative for acute change, except as noted in the HPI.    Physical Exam Updated Vital Signs BP (!) 135/94   Pulse 76   Temp 98.4 F (36.9 C) (Oral)   Resp 14   Ht 5\' 8"  (1.727 m)   Wt 79.4 kg   SpO2 95%   BMI 26.61 kg/m   Physical Exam Vitals and nursing note reviewed.  Constitutional:      General: He is not in acute distress.    Appearance: He is well-developed. He is not diaphoretic.     Comments: Nontoxic appearing and in NAD  HENT:     Head: Normocephalic and atraumatic.  Eyes:     General: No scleral icterus.    Conjunctiva/sclera: Conjunctivae normal.  Cardiovascular:     Rate and Rhythm: Normal rate and regular rhythm.     Pulses: Normal pulses.  Pulmonary:     Effort: Pulmonary effort  is normal. No respiratory distress.     Breath sounds: No stridor.     Comments: Respirations even and unlabored Musculoskeletal:        General: Normal range of motion.     Cervical back: Normal range of motion.  Skin:    General: Skin is warm and dry.     Coloration: Skin is not pale.     Findings: No erythema or rash.  Neurological:     Mental Status: He is alert and oriented to person, place, and time.     Coordination: Coordination normal.  Psychiatric:        Mood and Affect: Mood is anxious.        Behavior: Behavior normal.     ED Results / Procedures / Treatments   Labs (all labs ordered are listed, but only abnormal results are displayed) Labs Reviewed  BASIC METABOLIC PANEL - Abnormal; Notable for the following components:      Result Value   Glucose, Bld 107 (*)    All  other components within normal limits  CBC  MAGNESIUM  TROPONIN I (HIGH SENSITIVITY)  TROPONIN I (HIGH SENSITIVITY)    EKG None  Radiology DG Chest 2 View Result Date: 01/07/2024 CLINICAL DATA:  Chest pain and palpitations. EXAM: CHEST - 2 VIEW COMPARISON:  January 06, 2024 FINDINGS: The heart size and mediastinal contours are within normal limits. Both lungs are clear. The visualized skeletal structures are unremarkable. IMPRESSION: No active cardiopulmonary disease. Electronically Signed   By: Aram Candela M.D.   On: 01/07/2024 21:24   DG Chest 2 View Result Date: 01/06/2024 CLINICAL DATA:  Irregular heart beat for 3 days. EXAM: CHEST - 2 VIEW COMPARISON:  April 27, 2015 FINDINGS: The heart size and mediastinal contours are within normal limits. Both lungs are clear. The visualized skeletal structures are unremarkable. IMPRESSION: No active cardiopulmonary disease. Electronically Signed   By: Aram Candela M.D.   On: 01/06/2024 03:25    Procedures Procedures  {Document cardiac monitor, telemetry assessment procedure when appropriate:1}  Medications Ordered in ED Medications  hydrOXYzine (ATARAX) tablet 25 mg (25 mg Oral Given 01/08/24 0155)    ED Course/ Medical Decision Making/ A&P   {   Click here for ABCD2, HEART and other calculatorsREFRESH Note before signing :1}                              Medical Decision Making Amount and/or Complexity of Data Reviewed Labs: ordered. Radiology: ordered.  Risk Prescription drug management.   ***  {Document critical care time when appropriate:1} {Document review of labs and clinical decision tools ie heart score, Chads2Vasc2 etc:1}  {Document your independent review of radiology images, and any outside records:1} {Document your discussion with family members, caretakers, and with consultants:1} {Document social determinants of health affecting pt's care:1} {Document your decision making why or why not admission,  treatments were needed:1} Final Clinical Impression(s) / ED Diagnoses Final diagnoses:  None    Rx / DC Orders ED Discharge Orders     None

## 2024-01-08 NOTE — Discharge Instructions (Signed)
Your evaluation in the emergency department today was reassuring.  You may benefit from Holter monitoring if your symptoms continue.  Take your daily prescribed medications including your blood thinner.  Follow-up with your primary care doctor as well as your cardiologist.  You may return to the ED for new or concerning symptoms.

## 2024-01-11 DIAGNOSIS — G4700 Insomnia, unspecified: Secondary | ICD-10-CM | POA: Diagnosis not present

## 2024-01-11 DIAGNOSIS — E876 Hypokalemia: Secondary | ICD-10-CM | POA: Diagnosis not present

## 2024-01-11 DIAGNOSIS — F410 Panic disorder [episodic paroxysmal anxiety] without agoraphobia: Secondary | ICD-10-CM | POA: Diagnosis not present

## 2024-01-11 DIAGNOSIS — R002 Palpitations: Secondary | ICD-10-CM | POA: Diagnosis not present

## 2024-01-19 ENCOUNTER — Ambulatory Visit: Payer: BC Managed Care – PPO | Admitting: Nurse Practitioner

## 2024-01-19 NOTE — Progress Notes (Unsigned)
 No chief complaint on file.  History of Present Illness: 58 yo male with history of CAD, HTN, HLD, SVT, ischemic cardiomyopathy, PE, PVCs, PACs, chronic combined CHF and LV apical thrombus who is here today for cardiac follow up. He had an anterior STEMI in November 2012 secondary to an occluded LAD that was treated with a bare metal stent. He was treated with short term anti-coagulation for LV apical thrombus. LV function normalized on echo in 2013 and his anti-coagulation was stopped. He had a PE in May 2016 and has been on anti-coagulation since then.  Echo in 2016 with apical HK reported with possible LV apical thrombus. He was admitted to [facility] 04/27/15 with fatigue and had an inpatient stress myoview with no ischemia. He did not tolerate Metoprolol but has tolerated Coreg. 48 hour monitor June 2016 with 12 beat run NSVT, rare PVCs, rare PACs. Echo December 2017 with LVEF=45-50%, apical dyskinesis with no evidence of thrombus, grade 1 diastolic dysfunction. Echo in March 2024 with evidence of apical akinesis and LV thrombus. He had not taken Xarelto for several months. Echo June 2024 with LVEF=45-50% with LV apical akinesis with evidence of laminated thrombus. No valve disease.   He is here today for follow up. The patient denies any chest pain, dyspnea, palpitations, lower extremity edema, orthopnea, PND, dizziness, near syncope or syncope.   Primary Care Physician:  Georgann Housekeeper, MD  Past Medical History:  Diagnosis Date   Cardiomyopathy, ischemic 09/2011   Chronic combined systolic and diastolic CHF (congestive heart failure) (HCC)    Coronary artery disease    late presentation with ant STEMI 11/12: LHC 10/12/11: oLM 20%, LAD occluded with dissection 2/2 MI.  PCI with BMS to LAD with poor distal runoff; small caliber apical vessel c/w edematous apex from delayed presentation.   Elevated LFTs    Abdominal ultrasound was unremarkable   Family history of early CAD    Brother had MI at age  103 yo.   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 After STEMI 2012, also noted on CT in 2016. b. 2D echo 2017: no thrombus noted.   NSVT (nonsustained ventricular tachycardia) (HCC)    PE (pulmonary embolism) 03/2015   a. CTA 03/2015: very small amount of nonocclusive distal segmental/proximal subsegmental left lower lobe embolus as well as myocardial thinning of the left ventricular apex consistent with prior infarction; Adjacent filling defect may reflect thrombus in the left ventricular apex.    Premature atrial contractions    PSVT (paroxysmal supraventricular tachycardia) (HCC)    s/p adenocard 12 mg IVP 10/2011   PVC's (premature ventricular contractions)    ST elevation myocardial infarction (STEMI) of anterior wall (HCC) 09/2011   a. 09/2011 s/p BMS to occluded LAD.    Past Surgical History:  Procedure Laterality Date   CORONARY ANGIOGRAM N/A 10/12/2011   Procedure: CORONARY ANGIOGRAM;  Surgeon: Herby Abraham, MD;  Location: [facility] CATH LAB;  Service: Cardiovascular;  Laterality: N/A;   LEG SURGERY     cyst removal   PERCUTANEOUS STENT INTERVENTION N/A 10/12/2011   Procedure: PERCUTANEOUS STENT INTERVENTION;  Surgeon: Herby Abraham, MD;  Location: [facility] CATH LAB;  Service: Cardiovascular;  Laterality: N/A;   WRIST SURGERY     cyst removal     Current Outpatient Medications  Medication Sig Dispense Refill   acetaminophen (TYLENOL) 325 MG tablet Take 650 mg by mouth every  6 (six) hours as needed for mild pain.     amLODipine (NORVASC) 5 MG tablet Take 1 tablet (5 mg total) by mouth daily. 90 tablet 3   atorvastatin (LIPITOR) 80 MG tablet Take 1 tablet (80 mg total) by mouth daily. 90 tablet 3   carvedilol (COREG) 12.5 MG tablet Take 1 tablet (12.5 mg total) by mouth 2 (two) times daily with a meal. 180 tablet 3   clonazePAM (KLONOPIN) 1 MG tablet Take 1 mg by mouth daily.  2   hydrOXYzine  (ATARAX/VISTARIL) 25 MG tablet Take 1 tablet (25 mg total) by mouth every 6 (six) hours. 12 tablet 0   losartan (COZAAR) 100 MG tablet TAKE 1 TABLET BY MOUTH EVERY DAY 30 tablet 11   nitroGLYCERIN (NITROSTAT) 0.4 MG SL tablet Place 1 tablet (0.4 mg total) under the tongue every 5 (five) minutes as needed for chest pain. 25 tablet 3   potassium chloride SA (KLOR-CON) 20 MEQ tablet Take 2 tablets by mouth once now. 2 tablet 0   rivaroxaban (XARELTO) 20 MG TABS tablet Take 1 tablet (20 mg total) by mouth daily with supper. 90 tablet 1   sertraline (ZOLOFT) 50 MG tablet Take 0.5 tablets (25 mg total) by mouth daily.     sildenafil (VIAGRA) 50 MG tablet Take 50 mg by mouth as needed for erectile dysfunction.     spironolactone (ALDACTONE) 25 MG tablet TAKE 1 TABLET BY MOUTH EVERY DAY 90 tablet 2   zolpidem (AMBIEN) 5 MG tablet Take 5 mg by mouth at bedtime as needed for sleep.     No current facility-administered medications for this visit.    Allergies  Allergen Reactions   Lisinopril Cough    Social History   Socioeconomic History   Marital status: Single    Spouse name: Not on file   Number of children: 0   Years of education: Not on file   Highest education level: Not on file  Occupational History   Not on file  Tobacco Use   Smoking status: Never   Smokeless tobacco: Never  Vaping Use   Vaping status: Never Used  Substance and Sexual Activity   Alcohol use: Yes    Alcohol/week: 1.0 standard drink of alcohol    Types: 1 Cans of beer per week    Comment: socially   Drug use: No   Sexual activity: Yes    Birth control/protection: None  Other Topics Concern   Not on file  Social History Narrative   Not on file   Social Drivers of Health   Financial Resource Strain: Not on file  Food Insecurity: Not on file  Transportation Needs: Not on file  Physical Activity: Not on file  Stress: Not on file  Social Connections: Not on file  Intimate Partner Violence: Not on file     Family History  Problem Relation Age of Onset   Hypertension Mother    Hypertension Brother    Hypertension Brother    Coronary artery disease Brother        Had MI in his 5 yo    Review of Systems:  As stated in the HPI and otherwise negative.   There were no vitals taken for this visit.  Physical Examination: General: Well developed, well nourished, NAD  HEENT: OP clear, mucus membranes moist  SKIN: warm, dry. No rashes. Neuro: No focal deficits  Musculoskeletal: Muscle strength 5/5 all ext  Psychiatric: Mood and affect normal  Neck: No JVD, no carotid  bruits, no thyromegaly, no lymphadenopathy.  Lungs:Clear bilaterally, no wheezes, rhonci, crackles Cardiovascular: Regular rate and rhythm. No murmurs, gallops or rubs. Abdomen:Soft. Bowel sounds present. Non-tender.  Extremities: No lower extremity edema. Pulses are 2 + in the bilateral DP/PT.  EKG:  EKG is *** ordered today. The ekg ordered today demonstrates   Recent Labs: 01/06/2024: TSH 2.052 01/07/2024: BUN 8; Creatinine, Ser 1.03; Hemoglobin 15.0; Magnesium 2.1; Platelets 190; Potassium 3.5; Sodium 140   Lipid Panel    Component Value Date/Time   CHOL 115 02/06/2021 0806   TRIG 48 02/06/2021 0806   HDL 35 (L) 02/06/2021 0806   CHOLHDL 3.3 02/06/2021 0806   CHOLHDL 4.1 10/30/2016 1018   VLDL 20 10/30/2016 1018   LDLCALC 69 02/06/2021 0806     Wt Readings from Last 3 Encounters:  01/08/24 79.4 kg  01/06/24 83.5 kg  04/04/23 83.5 kg    Assessment and Plan:   1. CAD without angina: No chest pain. Continue Coreg and Lipitor. He is not on an ASA since he is on Xarelto.     2. HTN: BP is well controlled. Continue current therapy as above.   3. LV apical thrombus: Noted again on echo in June 2024. Given his LV apical akinesis, would plan to continue Xarelto for lifetime.    4. Ischemic cardiomyopathy/Chronic combined systolic and diastolic CHF: Weight is stable. No volume overload on exam. Continue Coreg,  Losartan and aldactone. NYHA class 1.   5. HLD: LDL ***. Continue Lipitor. ***    Labs/ tests ordered today include:  No orders of the defined types were placed in this encounter.  Disposition:   F/U with me in 12  months  Signed, Verne Carrow, MD 01/19/2024 11:59 AM    [facility] [address] [place], [place], [place]  [address] Phone: [phone]; Fax: [phone]

## 2024-01-20 ENCOUNTER — Ambulatory Visit: Payer: BC Managed Care – PPO | Attending: Cardiovascular Disease | Admitting: Cardiovascular Disease

## 2024-01-20 ENCOUNTER — Encounter: Payer: Self-pay | Admitting: Cardiovascular Disease

## 2024-01-20 VITALS — BP 118/80 | HR 60 | Ht 68.0 in | Wt 180.6 lb

## 2024-01-20 DIAGNOSIS — F410 Panic disorder [episodic paroxysmal anxiety] without agoraphobia: Secondary | ICD-10-CM | POA: Diagnosis not present

## 2024-01-20 DIAGNOSIS — E7800 Pure hypercholesterolemia, unspecified: Secondary | ICD-10-CM

## 2024-01-20 DIAGNOSIS — I5042 Chronic combined systolic (congestive) and diastolic (congestive) heart failure: Secondary | ICD-10-CM

## 2024-01-20 DIAGNOSIS — I2510 Atherosclerotic heart disease of native coronary artery without angina pectoris: Secondary | ICD-10-CM | POA: Diagnosis not present

## 2024-01-20 DIAGNOSIS — I493 Ventricular premature depolarization: Secondary | ICD-10-CM | POA: Diagnosis not present

## 2024-01-20 DIAGNOSIS — I513 Intracardiac thrombosis, not elsewhere classified: Secondary | ICD-10-CM

## 2024-01-20 DIAGNOSIS — I10 Essential (primary) hypertension: Secondary | ICD-10-CM

## 2024-01-20 DIAGNOSIS — G4700 Insomnia, unspecified: Secondary | ICD-10-CM | POA: Diagnosis not present

## 2024-01-20 MED ORDER — NITROGLYCERIN 0.4 MG SL SUBL: SUBLINGUAL_TABLET | SUBLINGUAL | 6 refills | Status: AC

## 2024-01-20 NOTE — Patient Instructions (Signed)
 Medication Instructions:  Nitroglycerin refill *If you need a refill on your cardiac medications before your next appointment, please call your pharmacy*  Testing/Procedures: Exercise Myoview Stress Test Your physician has requested that you have en exercise stress myoview. For further information please visit [email or web address] Please follow instruction sheet, as given.  Follow-Up: At [facility], you and your health needs are our priority.  As part of our continuing mission to provide you with exceptional heart care, we have created designated Provider Care Teams.  These Care Teams include your primary Cardiologist (physician) and Advanced Practice Providers (APPs -  Physician Assistants and Nurse Practitioners) who all work together to provide you with the care you need, when you need it.  Your next appointment:   6 month(s)  Provider:   APP    Other Instructions   1st Floor: - Lobby - Registration  - Pharmacy  - Lab - Cafe  2nd Floor: - PV Lab - Diagnostic Testing (echo, CT, nuclear med)  3rd Floor: - Vacant  4th Floor: - TCTS (cardiothoracic surgery) - AFib Clinic - Structural Heart Clinic - Vascular Surgery  - Vascular Ultrasound  5th Floor: - HeartCare Cardiology (general and EP) - Clinical Pharmacy for coumadin, hypertension, lipid, weight-loss medications, and med management appointments    Valet parking services will be available as well.

## 2024-01-21 ENCOUNTER — Telehealth (HOSPITAL_COMMUNITY): Payer: Self-pay | Admitting: *Deleted

## 2024-01-21 NOTE — Telephone Encounter (Signed)
 Left message on voicemail per DPR in reference to upcoming appointment scheduled on 01/28/2024 at 7:15 with detailed instructions given per Myocardial Perfusion Study Information Sheet for the test. LM to arrive 15 minutes early, and that it is imperative to arrive on time for appointment to keep from having the test rescheduled. If you need to cancel or reschedule your appointment, please call the office within 24 hours of your appointment. Failure to do so may result in a cancellation of your appointment, and a $50 no show fee. Phone number given for call back for any questions.

## 2024-01-26 ENCOUNTER — Encounter (HOSPITAL_COMMUNITY): Payer: Self-pay

## 2024-01-26 ENCOUNTER — Emergency Department (HOSPITAL_COMMUNITY): Admission: EM | Admit: 2024-01-26 | Discharge: 2024-01-26 | Disposition: A | Discharge status: Home / Self Care

## 2024-01-26 ENCOUNTER — Other Ambulatory Visit: Payer: Self-pay

## 2024-01-26 DIAGNOSIS — Z7901 Long term (current) use of anticoagulants: Secondary | ICD-10-CM | POA: Insufficient documentation

## 2024-01-26 DIAGNOSIS — G4700 Insomnia, unspecified: Secondary | ICD-10-CM | POA: Diagnosis not present

## 2024-01-26 DIAGNOSIS — I509 Heart failure, unspecified: Secondary | ICD-10-CM | POA: Insufficient documentation

## 2024-01-26 DIAGNOSIS — I252 Old myocardial infarction: Secondary | ICD-10-CM | POA: Diagnosis not present

## 2024-01-26 NOTE — ED Triage Notes (Signed)
 Patient reports he is having trouble sleeping.  Reports went to PCP and was prescribed a medication last Thursday and slept for 4 hours but unable to sleep more than that now.  Denies any other symptoms.

## 2024-01-26 NOTE — ED Provider Notes (Signed)
 [place] EMERGENCY DEPARTMENT AT [facility] Provider Note   CSN: [id number] Arrival date & time: 01/26/24  0544     History  Chief Complaint  Patient presents with   Insomnia    Rodney Marquez is a 58 y.o. male.   Insomnia  Patient is a 58 year old male presents the ED today complaining of insomnia saying that he has not slept in the last 24 hours.  Reports that the insomnia started after he was seen on 01/06/2024 for palpitations.  He also had undergone a sleep study and was started on CPAP 1 month ago.  Tried taking zolpidem and is currently taking eszopiclone with some relief.  Previous medical History of NSVT, STEMI, CHF, palpitations.  Taking Xarelto.  Compliant with medications.  States that he usually watches TV/phone before bed however does not ingest caffeine regularly.  Denies vision changes, hearing changes/loss, vertigo, chest pain, shortness of breath, abdominal pain, nausea, vomiting, diarrhea, lower extremity swelling, dysuria, melena, hematochezia.      Home Medications Prior to Admission medications   Medication Sig Start Date End Date Taking? Authorizing Provider  acetaminophen (TYLENOL) 325 MG tablet Take 650 mg by mouth every 6 (six) hours as needed for mild pain.    [provider]  amLODipine (NORVASC) 5 MG tablet Take 1 tablet (5 mg total) by mouth daily. 03/20/21   Dunn, Tacey Ruiz, PA-C  atorvastatin (LIPITOR) 80 MG tablet Take 1 tablet (80 mg total) by mouth daily. 12/13/20   Dunn, Tacey Ruiz, PA-C  carvedilol (COREG) 12.5 MG tablet Take 1 tablet (12.5 mg total) by mouth 2 (two) times daily with a meal. 10/30/16   Dunn, Dayna N, PA-C  clonazePAM (KLONOPIN) 1 MG tablet Take 1 mg by mouth daily. 06/27/17   [provider]  hydrOXYzine (ATARAX/VISTARIL) 25 MG tablet Take 1 tablet (25 mg total) by mouth every 6 (six) hours. 10/31/20   Liberty Handy, PA-C  losartan (COZAAR) 100 MG tablet TAKE 1 TABLET BY MOUTH EVERY DAY 08/30/14   Kathleene Hazel, MD  nitroGLYCERIN (NITROSTAT) 0.4 MG SL tablet Dissolve 1 tablet under the tongue every 5 minutes as needed for chest pain. Max of 3 doses, then 911. 01/20/24   Kathleene Hazel, MD  potassium chloride SA (KLOR-CON) 20 MEQ tablet Take 2 tablets by mouth once now. 01/03/21   Laurann Montana, PA-C  rivaroxaban (XARELTO) 20 MG TABS tablet Take 1 tablet (20 mg total) by mouth daily with supper. 12/29/23   Kathleene Hazel, MD  sertraline (ZOLOFT) 50 MG tablet Take 0.5 tablets (25 mg total) by mouth daily. 04/28/15   Robbie Lis M, PA-C  sildenafil (VIAGRA) 50 MG tablet Take 50 mg by mouth as needed for erectile dysfunction. 05/09/20   [provider]  spironolactone (ALDACTONE) 25 MG tablet TAKE 1 TABLET BY MOUTH EVERY DAY 11/08/17   Dunn, Dayna N, PA-C  zolpidem (AMBIEN) 5 MG tablet Take 5 mg by mouth at bedtime as needed for sleep. 10/29/20   [provider]      Allergies    Lisinopril    Review of Systems   Review of Systems  Psychiatric/Behavioral:  Positive for sleep disturbance. The patient has insomnia.   All other systems reviewed and are negative.   Physical Exam Updated Vital Signs BP (!) 140/90 (BP Location: Right Arm)   Pulse 72   Temp 98.5 F (36.9 C)   Resp 16   Ht 5\' 8"  (1.727 m)  Wt 81.6 kg   SpO2 100%   BMI 27.37 kg/m  Physical Exam Vitals and nursing note reviewed.  Constitutional:      General: He is not in acute distress.    Appearance: Normal appearance. He is not ill-appearing.  HENT:     Head: Normocephalic and atraumatic.     Nose: Nose normal. No congestion.     Mouth/Throat:     Mouth: Mucous membranes are moist.     Pharynx: Oropharynx is clear. No posterior oropharyngeal erythema.  Eyes:     General: No scleral icterus.       Right eye: No discharge.        Left eye: No discharge.     Extraocular Movements: Extraocular movements intact.     Conjunctiva/sclera: Conjunctivae normal.     Pupils: Pupils  are equal, round, and reactive to light.  Cardiovascular:     Rate and Rhythm: Normal rate and regular rhythm.     Pulses: Normal pulses.     Heart sounds: Normal heart sounds. No murmur heard.    No friction rub. No gallop.  Pulmonary:     Effort: Pulmonary effort is normal. No respiratory distress.     Breath sounds: Normal breath sounds. No stridor. No wheezing or rales.  Abdominal:     General: Abdomen is flat.     Palpations: Abdomen is soft.     Tenderness: There is no abdominal tenderness.  Musculoskeletal:        General: No tenderness.     Cervical back: Normal range of motion and neck supple.     Right lower leg: No edema.     Left lower leg: No edema.  Skin:    General: Skin is warm and dry.     Coloration: Skin is not jaundiced or pale.     Findings: No bruising or erythema.  Neurological:     General: No focal deficit present.     Mental Status: He is alert and oriented to person, place, and time. Mental status is at baseline.     Sensory: No sensory deficit.     Motor: No weakness.     Coordination: Coordination normal.  Psychiatric:        Mood and Affect: Mood normal.     ED Results / Procedures / Treatments   Labs (all labs ordered are listed, but only abnormal results are displayed) Labs Reviewed - No data to display  EKG None  Radiology No results found.  Procedures Procedures    Medications Ordered in ED Medications - No data to display  ED Course/ Medical Decision Making/ A&P                                 Medical Decision Making  Patient is a 58 year old male presents the ED today complaining of insomnia saying that he has not slept in the last 24 hours.  Reports that the insomnia started after he was seen on 01/06/2024 for palpitations.  He also had undergone a sleep study and was started on CPAP 1 month ago.  Tried taking zolpidem and is currently taking eszopiclone with some relief.  Previous medical History of NSVT, STEMI, CHF,  palpitations.  Taking Xarelto.  Compliant with medications.  States that he usually watches TV/phone before bed however does not ingest caffeine regularly.  Denies drug use  On exam, patient is alert and oriented x 4.  No acute distress, afebrile.  Normal sensation bilaterally.  No gross visual deficits in all visual fields.  Normal EOM.  LCTAB.  Has a notable umbilical hernia on abdominal exam, that has been present for some time and is not currently painful.  Due to patient symptoms been present for only 24 hours and currently taking his eszopiclone with some relief, will recommend that he follows up again with his PCP for reevaluation of the insomnia.  Will provide him a work note to help him be able to regain his sleep routine.  Low suspicion for any emergent pathology present this time as patient is currently not having any neurological or cardiac symptoms.  However will have him continue to call follow-up with cardiology for the stress test that is already scheduled.  Consider doing further imaging/lab work to rule out heart issues however due to being asymptomatic, do not believe that is warranted at this time.  Patient vital signs remained stable to the course of his time here.  I believe patient is to be discharged at this time  Differential diagnoses prior to evaluation: Drug use, depression, anxiety, TBI, delirium, excessive caffeine use, OSA  Past Medical History / Social History / Additional history: Chart reviewed. Pertinent results include: HTN, HLD, CAD, PE, NSVT, PVCs, CHF  Medications / Treatment: No medications were required for this visit.  Will provide work note for him to able to regain sleep schedule and have him follow-up with PCP for further evaluation/workup.   Disposition: After consideration of the diagnostic results and the patients response to treatment, I feel that the patient bit from discharge and treatment as above.   emergency department workup does not suggest an  emergent condition requiring admission or immediate intervention beyond what has been performed at this time. The plan is: Follow-up with PCP, work on sleep hygiene, return for any new or worsening symptoms, follow-up with cardiology for stress test. The patient is safe for discharge and has been instructed to return immediately for worsening symptoms, change in symptoms or any other concerns.   Final Clinical Impression(s) / ED Diagnoses Final diagnoses:  Insomnia, unspecified type    Rx / DC Orders ED Discharge Orders     None         Lunette Stands, PA-C 01/26/24 4098    Coral Spikes, DO 01/26/24 1513

## 2024-01-26 NOTE — Discharge Instructions (Addendum)
 You were seen today for insomnia.  Your physical exam was very reassuring that I have low suspicion for any emergent processes present at this time.  Recommend that you continue to work on your sleep hygiene, avoid caffeine, try a warm shower before bed, do not eat 3 hours prior to bed, drink 2 hours prior before bed, watch any TV or phone 1 hour before bed.  Recommend that you follow back up with your PCP for reevaluation of your insomnia.  As I believe this is most likely due to either anxiety or your CPAP causing sleep disturbances.  Return to the ED if you begin to experience any sort of visual disturbances, weakness, numbness, tingling, shortness of breath, chest pain, fever.

## 2024-01-27 ENCOUNTER — Telehealth (HOSPITAL_COMMUNITY): Payer: Self-pay | Admitting: Cardiovascular Disease

## 2024-01-27 NOTE — Telephone Encounter (Signed)
 Patient called and cancelled Myoview scheduled for 01/28/24. Patient states he will call back to reschedule. Order will be removed from the active Nuc WQ and when patient calls back we will reinstate the order. Thank you.

## 2024-01-28 ENCOUNTER — Ambulatory Visit (HOSPITAL_COMMUNITY): Payer: BC Managed Care – PPO

## 2024-02-09 DIAGNOSIS — R002 Palpitations: Secondary | ICD-10-CM | POA: Diagnosis not present

## 2024-02-18 DIAGNOSIS — I471 Supraventricular tachycardia, unspecified: Secondary | ICD-10-CM | POA: Diagnosis not present

## 2024-02-18 DIAGNOSIS — F5101 Primary insomnia: Secondary | ICD-10-CM | POA: Diagnosis not present

## 2024-02-18 DIAGNOSIS — I10 Essential (primary) hypertension: Secondary | ICD-10-CM | POA: Diagnosis not present

## 2024-02-18 DIAGNOSIS — G4733 Obstructive sleep apnea (adult) (pediatric): Secondary | ICD-10-CM | POA: Diagnosis not present

## 2024-02-21 ENCOUNTER — Ambulatory Visit: Payer: BC Managed Care – PPO | Admitting: Cardiovascular Disease

## 2024-04-13 ENCOUNTER — Encounter: Payer: Self-pay | Admitting: Cardiovascular Disease

## 2024-06-01 DIAGNOSIS — E1169 Type 2 diabetes mellitus with other specified complication: Secondary | ICD-10-CM | POA: Diagnosis not present

## 2024-06-01 DIAGNOSIS — Z0000 Encounter for general adult medical examination without abnormal findings: Secondary | ICD-10-CM | POA: Diagnosis not present

## 2024-06-01 DIAGNOSIS — I513 Intracardiac thrombosis, not elsewhere classified: Secondary | ICD-10-CM | POA: Diagnosis not present

## 2024-06-01 DIAGNOSIS — G4733 Obstructive sleep apnea (adult) (pediatric): Secondary | ICD-10-CM | POA: Diagnosis not present

## 2024-06-01 DIAGNOSIS — I471 Supraventricular tachycardia, unspecified: Secondary | ICD-10-CM | POA: Diagnosis not present

## 2024-06-01 DIAGNOSIS — I5042 Chronic combined systolic (congestive) and diastolic (congestive) heart failure: Secondary | ICD-10-CM | POA: Diagnosis not present

## 2024-06-09 ENCOUNTER — Encounter: Payer: Self-pay | Admitting: Cardiovascular Disease

## 2024-07-11 DIAGNOSIS — D696 Thrombocytopenia, unspecified: Secondary | ICD-10-CM | POA: Diagnosis not present

## 2024-07-14 ENCOUNTER — Telehealth: Payer: Self-pay | Admitting: Hematology and Oncology

## 2024-07-14 NOTE — Telephone Encounter (Signed)
 Davell called in for an earlier appointment. I informed Amariyon that he has the next available appointment. I told Tj that I will put him down for a cancellation if anyone cancels I will give him a call t re-schedule him.

## 2024-07-17 ENCOUNTER — Telehealth: Payer: Self-pay

## 2024-07-17 NOTE — Progress Notes (Unsigned)
 [facility] CONSULT NOTE  Patient Care Team: Ransom Other, MD as PCP - General (Internal Medicine) Verlin Lonni BIRCH, MD as PCP - Cardiology (Cardiology)   ASSESSMENT & PLAN 58 y.o.male with history of MI, CAD, ischemic cardiomyopathy, LV thrombus on Xarelto , diabetes, pulmonary embolism, hyperlipidemia, GERD, OSA referred for decreased white blood cell counts.  Clinically no concerning symptoms such as night sweats, weight loss, unusual bleeding, bruising, lymphadenopathy.  No recent infection, new medication, and no signs of autoimmune disease.  Discussed differential diagnosis today.  Based on testing today further testing may be needed.  We discussed sometimes bone marrow biopsy may be needed and he understands.  If there is improvement, we will continue to monitor.  If there is deficiency such as B12, folate, will replace first.  Assessment & Plan Leukopenia, unspecified type Lab ordered as below Pending results other testing may be needed Macrocytic anemia Lab ordered as below, pending results other testing maybe needed.  Orders Placed This Encounter  Procedures   CBC with Differential (Cancer Center Only)    Standing Status:   Future    Number of Occurrences:   1    Expiration Date:   07/18/2025   Comprehensive metabolic panel with GFR    Standing Status:   Future    Number of Occurrences:   1    Expiration Date:   07/18/2025   Lactate dehydrogenase    Standing Status:   Future    Number of Occurrences:   1    Expiration Date:   07/18/2025   Folate    Standing Status:   Future    Number of Occurrences:   1    Expiration Date:   07/18/2025   T4, free    Standing Status:   Future    Number of Occurrences:   1    Expiration Date:   08/18/2024   Vitamin B12    Standing Status:   Future    Number of Occurrences:   1    Expiration Date:   07/18/2025   Methylmalonic acid, serum    Standing Status:   Future    Number of Occurrences:   1    Expiration  Date:   08/18/2024   Copper , serum    Standing Status:   Future    Number of Occurrences:   1    Expiration Date:   07/18/2025   Ferritin    Standing Status:   Future    Number of Occurrences:   1    Expiration Date:   07/18/2025   Iron and Iron Binding Capacity ([facility] only)    Standing Status:   Future    Number of Occurrences:   1    Expiration Date:   07/18/2025   Rodney JAYSON Chihuahua, MD 07/18/2024 2:34 PM   CHIEF COMPLAINTS/PURPOSE OF CONSULTATION:  Leukopenia   HISTORY OF PRESENTING ILLNESS:  Rodney Marquez 58 y.o. male is here because of decreased WBC.  Report personal history of MI, hyperlipidemia, GERD, OSA  Patient was referred here for leukopenia.  Per outside testing showed WBC of 2.7 hemoglobin 13.5 MCV 98 platelet 159 ANC of 1.1. 06/01/2024 WBC 1.1 hemoglobin 10.7 MCV 99  07/11/2024 WBC 2.7 hemoglobin 10.5 MCV 98 platelet 155 ANC 1.1  He started a new diabetic medication 3 weeks ago. No other new medication or supplement.  No recent pneumonia, or other infection.   No family history of immunodeficiency or bone marrow failure syndrome.  He  does not eat much vegetables. Recent started b12 in July.  No history of autoimmune disorders, such as lupus or RA  He denies risk factors for HIV.  No heavy alcohol use  He denies mass, lymph node swelling, fever, drenching night sweats, unintentional weight loss  Report of decreased appetite.   MEDICAL HISTORY:  Past Medical History:  Diagnosis Date   Cardiomyopathy, ischemic 09/2011   Chronic combined systolic and diastolic CHF (congestive heart failure) (HCC)    Coronary artery disease    late presentation with ant STEMI 11/12: LHC 10/12/11: oLM 20%, LAD occluded with dissection 2/2 MI.  PCI with BMS to LAD with poor distal runoff; small caliber apical vessel c/w edematous apex from delayed presentation.   Elevated LFTs    Abdominal ultrasound was unremarkable   Family history of early CAD    Brother had MI at age 2  yo.   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 After STEMI 2012, also noted on CT in 2016. b. 2D echo 2017: no thrombus noted.   NSVT (nonsustained ventricular tachycardia) (HCC)    PE (pulmonary embolism) 03/2015   a. CTA 03/2015: very small amount of nonocclusive distal segmental/proximal subsegmental left lower lobe embolus as well as myocardial thinning of the left ventricular apex consistent with prior infarction; Adjacent filling defect may reflect thrombus in the left ventricular apex.    Premature atrial contractions    PSVT (paroxysmal supraventricular tachycardia) (HCC)    s/p adenocard  12 mg IVP 10/2011   PVC's (premature ventricular contractions)    ST elevation myocardial infarction (STEMI) of anterior wall (HCC) 09/2011   a. 09/2011 s/p BMS to occluded LAD.    SURGICAL HISTORY: Past Surgical History:  Procedure Laterality Date   CORONARY ANGIOGRAM N/A 10/12/2011   Procedure: CORONARY ANGIOGRAM;  Surgeon: Debby JONETTA Como, MD;  Location: [facility] CATH LAB;  Service: Cardiovascular;  Laterality: N/A;   LEG SURGERY     cyst removal   PERCUTANEOUS STENT INTERVENTION N/A 10/12/2011   Procedure: PERCUTANEOUS STENT INTERVENTION;  Surgeon: Debby JONETTA Como, MD;  Location: [facility] CATH LAB;  Service: Cardiovascular;  Laterality: N/A;   WRIST SURGERY     cyst removal     SOCIAL HISTORY: Social History   Socioeconomic History   Marital status: Single    Spouse name: Not on file   Number of children: 0   Years of education: Not on file   Highest education level: Not on file  Occupational History   Not on file  Tobacco Use   Smoking status: Never   Smokeless tobacco: Never  Vaping Use   Vaping status: Never Used  Substance and Sexual Activity   Alcohol use: Yes    Alcohol/week: 1.0 standard drink of alcohol    Types: 1 Cans of beer per week    Comment: socially   Drug use: No   Sexual activity: Yes     Birth control/protection: None  Other Topics Concern   Not on file  Social History Narrative   Not on file   Social Drivers of Health   Financial Resource Strain: Not on file  Food Insecurity: Food Insecurity Present (07/18/2024)   Hunger Vital Sign    Worried About Running Out of Food in the Last Year: Never true    Ran Out of Food in the Last Year: Sometimes true  Transportation Needs: No Transportation Needs (07/18/2024)  PRAPARE - Administrator, Civil Service (Medical): No    Lack of Transportation (Non-Medical): No  Physical Activity: Not on file  Stress: Not on file  Social Connections: Not on file  Intimate Partner Violence: Not At Risk (07/18/2024)   Humiliation, Afraid, Rape, and Kick questionnaire    Fear of Current or Ex-Partner: No    Emotionally Abused: No    Physically Abused: No    Sexually Abused: No    FAMILY HISTORY: Family History  Problem Relation Age of Onset   Hypertension Mother    Hypertension Brother    Hypertension Brother    Coronary artery disease Brother        Had MI in his 66 yo    ALLERGIES:  is allergic to lisinopril .  MEDICATIONS:  Current Outpatient Medications  Medication Sig Dispense Refill   acetaminophen  (TYLENOL ) 325 MG tablet Take 650 mg by mouth every 6 (six) hours as needed for mild pain.     amLODipine  (NORVASC ) 5 MG tablet Take 1 tablet (5 mg total) by mouth daily. 90 tablet 3   atorvastatin  (LIPITOR) 40 MG tablet TAKE 1 TABLET BY MOUTH EVERY DAY; Duration: 90     carvedilol  (COREG ) 12.5 MG tablet Take 1 tablet (12.5 mg total) by mouth 2 (two) times daily with a meal. 180 tablet 3   clonazePAM  (KLONOPIN ) 1 MG tablet Take 1 mg by mouth daily.  2   Eszopiclone 3 MG TABS Take 3 mg by mouth at bedtime.     hydrOXYzine  (ATARAX /VISTARIL ) 25 MG tablet Take 1 tablet (25 mg total) by mouth every 6 (six) hours. 12 tablet 0   losartan  (COZAAR ) 100 MG tablet TAKE 1 TABLET BY MOUTH EVERY DAY 30 tablet 11   nitroGLYCERIN   (NITROSTAT ) 0.4 MG SL tablet Dissolve 1 tablet under the tongue every 5 minutes as needed for chest pain. Max of 3 doses, then 911. 25 tablet 6   PARoxetine (PAXIL) 10 MG tablet Take 10 mg by mouth daily.     potassium chloride  SA (KLOR-CON ) 20 MEQ tablet Take 2 tablets by mouth once now. 2 tablet 0   rivaroxaban  (XARELTO ) 20 MG TABS tablet Take 1 tablet (20 mg total) by mouth daily with supper. 90 tablet 1   RYBELSUS 7 MG TABS Take 1 tablet by mouth daily.     sildenafil (VIAGRA) 50 MG tablet Take 50 mg by mouth as needed for erectile dysfunction.     spironolactone  (ALDACTONE ) 25 MG tablet TAKE 1 TABLET BY MOUTH EVERY DAY 90 tablet 2   zolpidem  (AMBIEN ) 5 MG tablet Take 5 mg by mouth at bedtime as needed for sleep.     No current facility-administered medications for this visit.    REVIEW OF SYSTEMS:   All relevant systems were reviewed with the patient and are negative.  PHYSICAL EXAMINATION: ECOG PERFORMANCE STATUS: 0 - Asymptomatic  Vitals:   07/18/24 1307  BP: (!) 135/96  Pulse: 68  Resp: 18  Temp: (!) 97.3 F (36.3 C)  SpO2: 99%   Filed Weights   07/18/24 1307  Weight: 184 lb 14.4 oz (83.9 kg)    GENERAL: alert, no distress and comfortable SKIN: skin color normal and no bruising or petechiae on exposed skin EYES:  sclera clear LYMPH:  no palpable cervical, axillary lymphadenopathy  LUNGS: clear to auscultation and percussion with normal breathing effort HEART: regular rate & rhythm and no murmurs  ABDOMEN: abdomen soft, non-tender and nondistended. Musculoskeletal: no edema  LABORATORY DATA:  I have reviewed the available outside records and [place] record.

## 2024-07-17 NOTE — Telephone Encounter (Signed)
 Rodney Marquez has called in to request a sooner appointment. I have re-scheduled him for 8/26 at 1pm with Dr. Tina.

## 2024-07-18 ENCOUNTER — Inpatient Hospital Stay

## 2024-07-18 VITALS — BP 135/96 | HR 68 | Temp 97.3°F | Resp 18 | Ht 68.0 in | Wt 184.9 lb

## 2024-07-18 DIAGNOSIS — I2510 Atherosclerotic heart disease of native coronary artery without angina pectoris: Secondary | ICD-10-CM | POA: Diagnosis not present

## 2024-07-18 DIAGNOSIS — Z86711 Personal history of pulmonary embolism: Secondary | ICD-10-CM | POA: Insufficient documentation

## 2024-07-18 DIAGNOSIS — D72819 Decreased white blood cell count, unspecified: Secondary | ICD-10-CM | POA: Diagnosis not present

## 2024-07-18 DIAGNOSIS — Z7901 Long term (current) use of anticoagulants: Secondary | ICD-10-CM | POA: Diagnosis not present

## 2024-07-18 DIAGNOSIS — D539 Nutritional anemia, unspecified: Secondary | ICD-10-CM | POA: Diagnosis not present

## 2024-07-18 DIAGNOSIS — E119 Type 2 diabetes mellitus without complications: Secondary | ICD-10-CM | POA: Diagnosis not present

## 2024-07-18 DIAGNOSIS — I255 Ischemic cardiomyopathy: Secondary | ICD-10-CM | POA: Diagnosis not present

## 2024-07-18 DIAGNOSIS — E785 Hyperlipidemia, unspecified: Secondary | ICD-10-CM | POA: Diagnosis not present

## 2024-07-18 DIAGNOSIS — I252 Old myocardial infarction: Secondary | ICD-10-CM | POA: Insufficient documentation

## 2024-07-18 LAB — CBC WITH DIFFERENTIAL (CANCER CENTER ONLY)
Abs Immature Granulocytes: 0.01 K/uL (ref 0.00–0.07)
Basophils Absolute: 0 K/uL (ref 0.0–0.1)
Basophils Relative: 0 %
Eosinophils Absolute: 0 K/uL (ref 0.0–0.5)
Eosinophils Relative: 0 %
HCT: 42.4 % (ref 39.0–52.0)
Hemoglobin: 15 g/dL (ref 13.0–17.0)
Immature Granulocytes: 0 %
Lymphocytes Relative: 35 %
Lymphs Abs: 1.3 K/uL (ref 0.7–4.0)
MCH: 33.7 pg (ref 26.0–34.0)
MCHC: 35.4 g/dL (ref 30.0–36.0)
MCV: 95.3 fL (ref 80.0–100.0)
Monocytes Absolute: 0.4 K/uL (ref 0.1–1.0)
Monocytes Relative: 11 %
Neutro Abs: 2.1 K/uL (ref 1.7–7.7)
Neutrophils Relative %: 54 %
Platelet Count: 145 K/uL — ABNORMAL LOW (ref 150–400)
RBC: 4.45 MIL/uL (ref 4.22–5.81)
RDW: 13.1 % (ref 11.5–15.5)
Smear Review: NORMAL
WBC Count: 3.9 K/uL — ABNORMAL LOW (ref 4.0–10.5)
nRBC: 0 % (ref 0.0–0.2)

## 2024-07-18 LAB — COMPREHENSIVE METABOLIC PANEL WITH GFR
ALT: 23 U/L (ref 0–44)
AST: 19 U/L (ref 15–41)
Albumin: 4.7 g/dL (ref 3.5–5.0)
Alkaline Phosphatase: 67 U/L (ref 38–126)
Anion gap: 3 — ABNORMAL LOW (ref 5–15)
BUN: 11 mg/dL (ref 6–20)
CO2: 31 mmol/L (ref 22–32)
Calcium: 9.4 mg/dL (ref 8.9–10.3)
Chloride: 105 mmol/L (ref 98–111)
Creatinine, Ser: 1.04 mg/dL (ref 0.61–1.24)
GFR, Estimated: >60 mL/min (ref >60)
Glucose, Bld: 86 mg/dL (ref 70–99)
Potassium: 4.3 mmol/L (ref 3.5–5.1)
Sodium: 139 mmol/L (ref 135–145)
Total Bilirubin: 1.5 mg/dL — ABNORMAL HIGH (ref 0.0–1.2)
Total Protein: 6.9 g/dL (ref 6.5–8.1)

## 2024-07-18 LAB — IRON AND IRON BINDING CAPACITY (CC-WL,HP ONLY)
Iron: 100 ug/dL (ref 45–182)
Saturation Ratios: 33 % (ref 17.9–39.5)
TIBC: 300 ug/dL (ref 250–450)
UIBC: 200 ug/dL (ref 117–376)

## 2024-07-18 LAB — VITAMIN B12: Vitamin B-12: 758 pg/mL (ref 180–914)

## 2024-07-18 LAB — T4, FREE: Free T4: 1.15 ng/dL — ABNORMAL HIGH (ref 0.61–1.12)

## 2024-07-18 LAB — FOLATE: Folate: 11.1 ng/mL (ref >5.9)

## 2024-07-18 LAB — FERRITIN: Ferritin: 314 ng/mL (ref 24–336)

## 2024-07-18 LAB — LACTATE DEHYDROGENASE: LDH: 162 U/L (ref 98–192)

## 2024-07-19 ENCOUNTER — Telehealth: Payer: Self-pay

## 2024-07-19 NOTE — Telephone Encounter (Signed)
 I spoke with Rodney Marquez and he is aware of his follow up appointment scheduled for 9/22 at 9:30.

## 2024-07-20 LAB — COPPER, SERUM: Copper: 119 ug/dL (ref 69–132)

## 2024-07-22 LAB — METHYLMALONIC ACID, SERUM: Methylmalonic Acid, Quantitative: 94 nmol/L (ref 0–378)

## 2024-07-25 ENCOUNTER — Telehealth: Payer: Self-pay

## 2024-07-25 ENCOUNTER — Ambulatory Visit: Payer: Self-pay

## 2024-07-25 DIAGNOSIS — D696 Thrombocytopenia, unspecified: Secondary | ICD-10-CM

## 2024-07-25 DIAGNOSIS — R7989 Other specified abnormal findings of blood chemistry: Secondary | ICD-10-CM

## 2024-07-25 NOTE — Telephone Encounter (Signed)
 Rodney Marquez returned my call and requested his lab appointment be scheduled at an earlier time. I informed Rodney Marquez that due to certain circumstances it is best that he schedule his lab appointment after 12noon and that his current 10am lab only appointment may be subject to change for a patient who may need a lab for treatment. Rodney Marquez was acceptable to the expectations provided to him based on our practice standards.

## 2024-07-25 NOTE — Telephone Encounter (Signed)
-----   Message from [name] sent at 07/25/2024 11:54 AM EDT ----- Please let him know his blood count numbers are improving.  They are mostly normalized.  Thyroid  function is slightly above normal.  Will not need additional testing at this time.  This is good news.   I recommend short-term follow-up in about 6-8 weeks.    We should repeat labs a day before he comes back to repeat blood count, LFT and thyroid  function again.  Please change his September follow-up appointment to Mid-October, labs 1-2 days before visit.   Thank you. ----- Message ----- From: Rebecka, Lab [facility] [place] Sent: 07/18/2024   3:12 PM EDT To: [name], MD

## 2024-07-25 NOTE — Telephone Encounter (Signed)
 I LVM informing Kerri of his follow up appointments.

## 2024-07-25 NOTE — Telephone Encounter (Signed)
 Notified of message below. Message to scheduler to make appts.

## 2024-08-04 DIAGNOSIS — G4700 Insomnia, unspecified: Secondary | ICD-10-CM | POA: Diagnosis not present

## 2024-08-04 DIAGNOSIS — F419 Anxiety disorder, unspecified: Secondary | ICD-10-CM | POA: Diagnosis not present

## 2024-08-14 ENCOUNTER — Encounter: Admitting: Hematology and Oncology

## 2024-08-14 ENCOUNTER — Other Ambulatory Visit

## 2024-08-14 ENCOUNTER — Ambulatory Visit

## 2024-09-04 ENCOUNTER — Other Ambulatory Visit

## 2024-09-04 ENCOUNTER — Inpatient Hospital Stay

## 2024-09-04 DIAGNOSIS — D696 Thrombocytopenia, unspecified: Secondary | ICD-10-CM

## 2024-09-04 DIAGNOSIS — D72819 Decreased white blood cell count, unspecified: Secondary | ICD-10-CM | POA: Insufficient documentation

## 2024-09-04 DIAGNOSIS — Z79899 Other long term (current) drug therapy: Secondary | ICD-10-CM | POA: Diagnosis not present

## 2024-09-04 DIAGNOSIS — R7989 Other specified abnormal findings of blood chemistry: Secondary | ICD-10-CM

## 2024-09-04 LAB — CBC WITH DIFFERENTIAL (CANCER CENTER ONLY)
Abs Immature Granulocytes: 0.01 K/uL (ref 0.00–0.07)
Basophils Absolute: 0 K/uL (ref 0.0–0.1)
Basophils Relative: 0 %
Eosinophils Absolute: 0 K/uL (ref 0.0–0.5)
Eosinophils Relative: 1 %
HCT: 39.9 % (ref 39.0–52.0)
Hemoglobin: 14 g/dL (ref 13.0–17.0)
Immature Granulocytes: 0 %
Lymphocytes Relative: 37 %
Lymphs Abs: 1.1 K/uL (ref 0.7–4.0)
MCH: 33.4 pg (ref 26.0–34.0)
MCHC: 35.1 g/dL (ref 30.0–36.0)
MCV: 95.2 fL (ref 80.0–100.0)
Monocytes Absolute: 0.3 K/uL (ref 0.1–1.0)
Monocytes Relative: 10 %
Neutro Abs: 1.6 K/uL — ABNORMAL LOW (ref 1.7–7.7)
Neutrophils Relative %: 52 %
Platelet Count: 174 K/uL (ref 150–400)
RBC: 4.19 MIL/uL — ABNORMAL LOW (ref 4.22–5.81)
RDW: 13.2 % (ref 11.5–15.5)
WBC Count: 3 K/uL — ABNORMAL LOW (ref 4.0–10.5)
nRBC: 0 % (ref 0.0–0.2)

## 2024-09-04 LAB — HEPATIC FUNCTION PANEL
ALT: 31 U/L (ref 0–44)
AST: 26 U/L (ref 15–41)
Albumin: 4.4 g/dL (ref 3.5–5.0)
Alkaline Phosphatase: 72 U/L (ref 38–126)
Bilirubin, Direct: 0.4 mg/dL — ABNORMAL HIGH (ref 0.0–0.2)
Indirect Bilirubin: 0.7 mg/dL (ref 0.3–0.9)
Total Bilirubin: 1 mg/dL (ref 0.0–1.2)
Total Protein: 5.9 g/dL — ABNORMAL LOW (ref 6.5–8.1)

## 2024-09-04 LAB — TSH: TSH: 1.2 u[IU]/mL (ref 0.350–4.500)

## 2024-09-04 LAB — T4, FREE: Free T4: 0.91 ng/dL (ref 0.61–1.12)

## 2024-09-05 ENCOUNTER — Ambulatory Visit: Payer: Self-pay

## 2024-09-06 NOTE — Progress Notes (Unsigned)
 [facility] OFFICE PROGRESS NOTE  Patient Care Team: Ransom Other, MD as PCP - General (Internal Medicine) Verlin Lonni BIRCH, MD as PCP - Cardiology (Cardiology)  58 y.o.male with history of MI, CAD, ischemic cardiomyopathy, LV thrombus on Xarelto , diabetes, pulmonary embolism, hyperlipidemia, GERD, OSA referred for decreased white blood cell counts.   Clinically no concerning symptoms such as night sweats, weight loss, unusual bleeding, bruising, lymphadenopathy.  No recent infection, new medication, and no signs of autoimmune disease.   Borderline neutropenia.  Assessment & Plan   No orders of the defined types were placed in this encounter.    [name], MD  INTERVAL HISTORY: Patient returns for follow-up.  Patient was referred here for leukopenia.  Per outside testing showed WBC of 2.7 hemoglobin 13.5 MCV 98 platelet 159 ANC of 1.1. 06/01/2024 WBC 1.1 hemoglobin 10.7 MCV 99  07/11/2024 WBC 2.7 hemoglobin 10.5 MCV 98 platelet 155 ANC 1.1      PHYSICAL EXAMINATION: ECOG PERFORMANCE STATUS: {CHL ONC ECOG [phone]}  There were no vitals filed for this visit. There were no vitals filed for this visit.  Relevant data reviewed during this visit included ***

## 2024-09-07 ENCOUNTER — Inpatient Hospital Stay (HOSPITAL_BASED_OUTPATIENT_CLINIC_OR_DEPARTMENT_OTHER)

## 2024-09-07 VITALS — BP 122/87 | HR 68 | Temp 97.7°F | Resp 20 | Wt 184.0 lb

## 2024-09-07 DIAGNOSIS — D72819 Decreased white blood cell count, unspecified: Secondary | ICD-10-CM | POA: Diagnosis not present

## 2024-09-07 DIAGNOSIS — Z79899 Other long term (current) drug therapy: Secondary | ICD-10-CM | POA: Diagnosis not present

## 2024-09-07 DIAGNOSIS — D72818 Other decreased white blood cell count: Secondary | ICD-10-CM | POA: Diagnosis not present

## 2024-10-16 ENCOUNTER — Other Ambulatory Visit: Payer: Self-pay | Admitting: *Deleted

## 2024-10-16 DIAGNOSIS — I236 Thrombosis of atrium, auricular appendage, and ventricle as current complications following acute myocardial infarction: Secondary | ICD-10-CM

## 2024-10-16 MED ORDER — RIVAROXABAN 20 MG PO TABS: 20.0000 mg | ORAL_TABLET | Freq: Every day | ORAL | 1 refills | Status: AC

## 2024-10-16 NOTE — Telephone Encounter (Signed)
 Xarelto  20mg  refill request received. Pt is 58 years old, weight-83.5kg, Crea-1.04 on 07/18/24, last seen by Dr. Verlin on 01/20/24, Diagnosis-LV Thrombus, CrCl-93.67 mL/min; Dose is appropriate based on dosing criteria. Will send in refill to requested pharmacy.

## 2024-12-01 ENCOUNTER — Inpatient Hospital Stay

## 2024-12-04 NOTE — Progress Notes (Unsigned)
 [facility] OFFICE PROGRESS NOTE  Patient Care Team: Ransom Other, MD as PCP - General (Internal Medicine) Verlin Lonni BIRCH, MD as PCP - Cardiology (Cardiology)  58 y.o.male with history of MI, CAD, ischemic cardiomyopathy, LV thrombus on Xarelto , diabetes, pulmonary embolism, hyperlipidemia, GERD, OSA referred for decreased white blood cell counts.   Clinically no concerning symptoms such as night sweats, weight loss, unusual bleeding, bruising, lymphadenopathy.  No recent infection, new medication, and no signs of autoimmune disease.   Assessment & Plan   No orders of the defined types were placed in this encounter.    Rodney JAYSON Chihuahua, MD  INTERVAL HISTORY: Patient returns for follow-up.  Oncology History   No problem history exists.     PHYSICAL EXAMINATION: ECOG PERFORMANCE STATUS: {CHL ONC ECOG [phone]}  There were no vitals filed for this visit. There were no vitals filed for this visit.  GENERAL: alert, no distress and comfortable SKIN: skin color normal and no jaundice or bruising or petechiae on exposed skin EYES: normal, sclera clear OROPHARYNX: no exudate  NECK: No palpable mass LYMPH:  no palpable cervical, axillary lymphadenopathy  LUNGS: clear to auscultation and no wheeze or rales with normal breathing effort HEART: regular rate & rhythm  ABDOMEN: abdomen soft, non-tender and nondistended. Musculoskeletal: no edema NEURO: no focal motor/sensory deficits  Relevant data reviewed during this visit included labs.  New labs ordered.

## 2024-12-05 ENCOUNTER — Inpatient Hospital Stay

## 2024-12-05 ENCOUNTER — Telehealth: Payer: Self-pay

## 2024-12-07 ENCOUNTER — Ambulatory Visit: Payer: Self-pay

## 2024-12-07 ENCOUNTER — Inpatient Hospital Stay

## 2024-12-07 DIAGNOSIS — D72818 Other decreased white blood cell count: Secondary | ICD-10-CM

## 2024-12-07 LAB — CMP (CANCER CENTER ONLY)
ALT: 24 U/L (ref 0–44)
AST: 25 U/L (ref 15–41)
Albumin: 4.2 g/dL (ref 3.5–5.0)
Alkaline Phosphatase: 72 U/L (ref 38–126)
Anion gap: 9 (ref 5–15)
BUN: 7 mg/dL (ref 6–20)
CO2: 29 mmol/L (ref 22–32)
Calcium: 9 mg/dL (ref 8.9–10.3)
Chloride: 105 mmol/L (ref 98–111)
Creatinine: 1.13 mg/dL (ref 0.61–1.24)
GFR, Estimated: >60 mL/min
Glucose, Bld: 103 mg/dL — ABNORMAL HIGH (ref 70–99)
Potassium: 3.5 mmol/L (ref 3.5–5.1)
Sodium: 143 mmol/L (ref 135–145)
Total Bilirubin: 0.9 mg/dL (ref 0.0–1.2)
Total Protein: 6.2 g/dL — ABNORMAL LOW (ref 6.5–8.1)

## 2024-12-07 LAB — CBC WITH DIFFERENTIAL (CANCER CENTER ONLY)
Abs Immature Granulocytes: 0 K/uL (ref 0.00–0.07)
Basophils Absolute: 0 K/uL (ref 0.0–0.1)
Basophils Relative: 0 %
Eosinophils Absolute: 0 K/uL (ref 0.0–0.5)
Eosinophils Relative: 1 %
HCT: 37.5 % — ABNORMAL LOW (ref 39.0–52.0)
Hemoglobin: 13.7 g/dL (ref 13.0–17.0)
Immature Granulocytes: 0 %
Lymphocytes Relative: 46 %
Lymphs Abs: 1.4 K/uL (ref 0.7–4.0)
MCH: 34.2 pg — ABNORMAL HIGH (ref 26.0–34.0)
MCHC: 36.5 g/dL — ABNORMAL HIGH (ref 30.0–36.0)
MCV: 93.5 fL (ref 80.0–100.0)
Monocytes Absolute: 0.4 K/uL (ref 0.1–1.0)
Monocytes Relative: 14 %
Neutro Abs: 1.2 K/uL — ABNORMAL LOW (ref 1.7–7.7)
Neutrophils Relative %: 39 %
Platelet Count: 146 K/uL — ABNORMAL LOW (ref 150–400)
RBC: 4.01 MIL/uL — ABNORMAL LOW (ref 4.22–5.81)
RDW: 12.9 % (ref 11.5–15.5)
WBC Count: 3.1 K/uL — ABNORMAL LOW (ref 4.0–10.5)
nRBC: 0 % (ref 0.0–0.2)

## 2024-12-07 LAB — LACTATE DEHYDROGENASE: LDH: 191 U/L (ref 105–235)

## 2024-12-10 DIAGNOSIS — D696 Thrombocytopenia, unspecified: Secondary | ICD-10-CM | POA: Insufficient documentation

## 2024-12-10 DIAGNOSIS — D709 Neutropenia, unspecified: Secondary | ICD-10-CM | POA: Insufficient documentation

## 2024-12-10 NOTE — Assessment & Plan Note (Addendum)
 Slightly lower thank last visit Repeat in 3 months.

## 2024-12-10 NOTE — Assessment & Plan Note (Addendum)
 Mild and borderline.

## 2024-12-10 NOTE — Progress Notes (Unsigned)
 [facility] OFFICE PROGRESS NOTE  Patient Care Team: Ransom Other, MD as PCP - General (Internal Medicine) Verlin Lonni BIRCH, MD as PCP - Cardiology (Cardiology)  59 y.o.male with history of MI, CAD, ischemic cardiomyopathy, LV thrombus on Xarelto , diabetes, pulmonary embolism, hyperlipidemia, GERD, OSA referred for decreased white blood cell counts.   Clinically no concerning symptoms such as night sweats, weight loss, unusual bleeding, bruising, lymphadenopathy.  No recent infection, new medication, and no signs of autoimmune disease.  Assessment & Plan Other neutropenia Slightly lower thank last visit Repeat in 3 months. Thrombocytopenia Mild and borderline.  Seeing PCP in July. Lab at 8 on 9/22 and see me at 8 on 9/24  Orders Placed This Encounter  Procedures   CBC with Differential (Cancer Center Only)    Standing Status:   Future    Expiration Date:   12/11/2025   Folate    Standing Status:   Future    Expiration Date:   12/11/2025   Ferritin    Standing Status:   Future    Expiration Date:   12/11/2025   Vitamin B12    Standing Status:   Future    Expiration Date:   12/11/2025     [name], MD  INTERVAL HISTORY: Patient returns for follow-up.  Report running, cold like symptoms, coughing about 2 weeks. No mass, lump, night sweats, loss of appetite, stomach pain, unexpected weight loss, bloody stool, melena, bloody urine. Overall feeling better and sleep better..    Hematology history Initially seen in 06/2024. Per outside testing showed WBC of 2.7 hemoglobin 13.5 MCV 98 platelet 159 ANC of 1.1. 06/01/2024 WBC 1.1 hemoglobin 10.7 MCV 99  07/11/2024 WBC 2.7 hemoglobin 10.5 MCV 98 platelet 155 ANC 1.1  PHYSICAL EXAMINATION:  Vitals:   12/11/24 0819 12/11/24 0820  BP: (!) 144/100 (!) 138/98  Pulse: 73   Resp: 14   Temp: (!) 97.5 F (36.4 C)   SpO2: 100%    Filed Weights   12/11/24 0819  Weight: 188 lb 14.4 oz (85.7 kg)     GENERAL: alert, no distress and comfortable NECK: No palpable mass LYMPH:  no palpable cervical, axillary lymphadenopathy  LUNGS: clear to auscultation and no wheeze or rales with normal breathing effort HEART: regular rate & rhythm  ABDOMEN: abdomen soft, non-tender and nondistended. Musculoskeletal: no edema   Relevant data reviewed during this visit included labs.  New labs ordered.

## 2024-12-11 ENCOUNTER — Inpatient Hospital Stay

## 2024-12-11 ENCOUNTER — Ambulatory Visit: Payer: Self-pay

## 2024-12-11 VITALS — BP 138/98 | HR 73 | Temp 97.5°F | Resp 14 | Wt 188.9 lb

## 2024-12-11 DIAGNOSIS — D696 Thrombocytopenia, unspecified: Secondary | ICD-10-CM | POA: Diagnosis not present

## 2024-12-11 DIAGNOSIS — D708 Other neutropenia: Secondary | ICD-10-CM

## 2024-12-11 LAB — CBC WITH DIFFERENTIAL (CANCER CENTER ONLY)
Abs Immature Granulocytes: 0 K/uL (ref 0.00–0.07)
Basophils Absolute: 0 K/uL (ref 0.0–0.1)
Basophils Relative: 1 %
Eosinophils Absolute: 0 K/uL (ref 0.0–0.5)
Eosinophils Relative: 1 %
HCT: 40.6 % (ref 39.0–52.0)
Hemoglobin: 14.8 g/dL (ref 13.0–17.0)
Immature Granulocytes: 0 %
Lymphocytes Relative: 43 %
Lymphs Abs: 1.5 K/uL (ref 0.7–4.0)
MCH: 34.4 pg — ABNORMAL HIGH (ref 26.0–34.0)
MCHC: 36.5 g/dL — ABNORMAL HIGH (ref 30.0–36.0)
MCV: 94.4 fL (ref 80.0–100.0)
Monocytes Absolute: 0.4 K/uL (ref 0.1–1.0)
Monocytes Relative: 12 %
Neutro Abs: 1.5 K/uL — ABNORMAL LOW (ref 1.7–7.7)
Neutrophils Relative %: 43 %
Platelet Count: 158 K/uL (ref 150–400)
RBC: 4.3 MIL/uL (ref 4.22–5.81)
RDW: 12.9 % (ref 11.5–15.5)
WBC Count: 3.4 K/uL — ABNORMAL LOW (ref 4.0–10.5)
nRBC: 0 % (ref 0.0–0.2)

## 2024-12-11 LAB — FERRITIN: Ferritin: 249 ng/mL (ref 24–336)

## 2024-12-11 LAB — VITAMIN B12: Vitamin B-12: 461 pg/mL (ref 180–914)

## 2024-12-11 LAB — FOLATE: Folate: 6.6 ng/mL

## 2025-08-14 ENCOUNTER — Inpatient Hospital Stay

## 2025-08-16 ENCOUNTER — Inpatient Hospital Stay
